# Patient Record
Sex: Female | Born: 1935 | Race: Black or African American | Hispanic: No | Marital: Married | Smoking: Former smoker
Health system: Southern US, Community
[De-identification: ages and names within clinical notes are randomized; demographics above are authoritative.]

## PROBLEM LIST (undated history)

## (undated) ENCOUNTER — Emergency Department (HOSPITAL_COMMUNITY): Payer: Medicare Other

## (undated) DIAGNOSIS — K219 Gastro-esophageal reflux disease without esophagitis: Secondary | ICD-10-CM

## (undated) DIAGNOSIS — C189 Malignant neoplasm of colon, unspecified: Secondary | ICD-10-CM

## (undated) DIAGNOSIS — G459 Transient cerebral ischemic attack, unspecified: Secondary | ICD-10-CM

## (undated) DIAGNOSIS — B029 Zoster without complications: Secondary | ICD-10-CM

## (undated) DIAGNOSIS — I2510 Atherosclerotic heart disease of native coronary artery without angina pectoris: Secondary | ICD-10-CM

## (undated) DIAGNOSIS — M549 Dorsalgia, unspecified: Secondary | ICD-10-CM

## (undated) DIAGNOSIS — N390 Urinary tract infection, site not specified: Secondary | ICD-10-CM

## (undated) DIAGNOSIS — H40009 Preglaucoma, unspecified, unspecified eye: Secondary | ICD-10-CM

## (undated) DIAGNOSIS — E785 Hyperlipidemia, unspecified: Secondary | ICD-10-CM

## (undated) DIAGNOSIS — I10 Essential (primary) hypertension: Secondary | ICD-10-CM

## (undated) HISTORY — DX: Hyperlipidemia, unspecified: E78.5

## (undated) HISTORY — DX: Malignant neoplasm of colon, unspecified: C18.9

## (undated) HISTORY — DX: Atherosclerotic heart disease of native coronary artery without angina pectoris: I25.10

## (undated) HISTORY — DX: Gastro-esophageal reflux disease without esophagitis: K21.9

## (undated) HISTORY — DX: Dorsalgia, unspecified: M54.9

## (undated) HISTORY — PX: ABDOMINAL HYSTERECTOMY: SHX81

## (undated) HISTORY — PX: BACK SURGERY: SHX140

## (undated) HISTORY — DX: Essential (primary) hypertension: I10

## (undated) HISTORY — DX: Zoster without complications: B02.9

## (undated) HISTORY — PX: OTHER SURGICAL HISTORY: SHX169

---

## 1998-05-13 ENCOUNTER — Other Ambulatory Visit: Admission: RE | Admit: 1998-05-13 | Discharge: 1998-05-13 | Payer: Self-pay | Admitting: Family Medicine

## 1999-04-08 ENCOUNTER — Inpatient Hospital Stay (HOSPITAL_COMMUNITY): Admission: AD | Admit: 1999-04-08 | Discharge: 1999-04-17 | Payer: Self-pay | Admitting: Family Medicine

## 1999-04-11 ENCOUNTER — Encounter: Payer: Self-pay | Admitting: Family Medicine

## 1999-04-28 ENCOUNTER — Encounter (HOSPITAL_BASED_OUTPATIENT_CLINIC_OR_DEPARTMENT_OTHER): Payer: Self-pay | Admitting: General Surgery

## 1999-04-28 ENCOUNTER — Ambulatory Visit (HOSPITAL_COMMUNITY): Admission: RE | Admit: 1999-04-28 | Discharge: 1999-04-28 | Payer: Self-pay | Admitting: General Surgery

## 1999-05-21 ENCOUNTER — Encounter (HOSPITAL_BASED_OUTPATIENT_CLINIC_OR_DEPARTMENT_OTHER): Payer: Self-pay | Admitting: General Surgery

## 1999-05-21 ENCOUNTER — Ambulatory Visit (HOSPITAL_BASED_OUTPATIENT_CLINIC_OR_DEPARTMENT_OTHER): Admission: RE | Admit: 1999-05-21 | Discharge: 1999-05-21 | Payer: Self-pay | Admitting: General Surgery

## 1999-06-22 ENCOUNTER — Encounter: Admission: RE | Admit: 1999-06-22 | Discharge: 1999-06-22 | Payer: Self-pay | Admitting: Oncology

## 1999-06-22 ENCOUNTER — Encounter: Payer: Self-pay | Admitting: Oncology

## 1999-12-24 ENCOUNTER — Ambulatory Visit (HOSPITAL_COMMUNITY): Admission: RE | Admit: 1999-12-24 | Discharge: 1999-12-24 | Payer: Self-pay | Admitting: Oncology

## 1999-12-24 ENCOUNTER — Encounter: Payer: Self-pay | Admitting: Oncology

## 2000-06-05 ENCOUNTER — Ambulatory Visit (HOSPITAL_COMMUNITY): Admission: RE | Admit: 2000-06-05 | Discharge: 2000-06-05 | Payer: Self-pay | Admitting: Gastroenterology

## 2000-11-16 ENCOUNTER — Encounter: Admission: RE | Admit: 2000-11-16 | Discharge: 2000-11-16 | Payer: Self-pay | Admitting: Oncology

## 2000-11-16 ENCOUNTER — Encounter: Payer: Self-pay | Admitting: Oncology

## 2003-05-07 ENCOUNTER — Encounter (INDEPENDENT_AMBULATORY_CARE_PROVIDER_SITE_OTHER): Payer: Self-pay | Admitting: *Deleted

## 2003-05-07 ENCOUNTER — Ambulatory Visit (HOSPITAL_COMMUNITY): Admission: RE | Admit: 2003-05-07 | Discharge: 2003-05-07 | Payer: Self-pay | Admitting: Gastroenterology

## 2004-10-06 ENCOUNTER — Observation Stay (HOSPITAL_COMMUNITY): Admission: EM | Admit: 2004-10-06 | Discharge: 2004-10-07 | Payer: Self-pay | Admitting: Emergency Medicine

## 2005-07-11 HISTORY — PX: CORONARY STENT PLACEMENT: SHX1402

## 2005-07-15 ENCOUNTER — Inpatient Hospital Stay (HOSPITAL_COMMUNITY): Admission: EM | Admit: 2005-07-15 | Discharge: 2005-07-17 | Payer: Self-pay | Admitting: Internal Medicine

## 2005-07-15 ENCOUNTER — Ambulatory Visit: Payer: Self-pay | Admitting: Cardiology

## 2005-08-05 ENCOUNTER — Ambulatory Visit: Payer: Self-pay | Admitting: Cardiology

## 2005-08-12 ENCOUNTER — Ambulatory Visit: Payer: Self-pay | Admitting: Cardiology

## 2005-10-20 ENCOUNTER — Ambulatory Visit: Payer: Self-pay | Admitting: Cardiology

## 2005-10-27 ENCOUNTER — Ambulatory Visit: Payer: Self-pay | Admitting: Cardiology

## 2006-02-01 ENCOUNTER — Ambulatory Visit: Payer: Self-pay | Admitting: Cardiology

## 2006-02-08 ENCOUNTER — Ambulatory Visit: Payer: Self-pay

## 2006-07-13 ENCOUNTER — Ambulatory Visit: Payer: Self-pay | Admitting: Cardiology

## 2006-07-17 ENCOUNTER — Ambulatory Visit: Payer: Self-pay | Admitting: Cardiology

## 2006-07-17 ENCOUNTER — Encounter: Payer: Self-pay | Admitting: Cardiology

## 2006-09-11 ENCOUNTER — Ambulatory Visit: Payer: Self-pay | Admitting: Cardiology

## 2006-09-11 LAB — CONVERTED CEMR LAB
Albumin: 4 g/dL (ref 3.5–5.2)
Cholesterol: 159 mg/dL (ref 0–200)
HDL: 54.2 mg/dL (ref >39.0)
Total Bilirubin: 0.5 mg/dL (ref 0.3–1.2)
Total Protein: 7.6 g/dL (ref 6.0–8.3)
VLDL: 28 mg/dL (ref 0–40)

## 2007-01-16 ENCOUNTER — Ambulatory Visit: Payer: Self-pay | Admitting: Cardiology

## 2007-01-22 ENCOUNTER — Ambulatory Visit: Payer: Self-pay

## 2007-02-06 ENCOUNTER — Ambulatory Visit: Payer: Self-pay | Admitting: Cardiology

## 2007-02-12 ENCOUNTER — Ambulatory Visit: Payer: Self-pay | Admitting: Cardiology

## 2007-02-12 LAB — CONVERTED CEMR LAB
CO2: 30 meq/L (ref 19–32)
Calcium: 8.9 mg/dL (ref 8.4–10.5)
Chloride: 108 meq/L (ref 96–112)
Creatinine, Ser: 0.7 mg/dL (ref 0.4–1.2)
GFR calc Af Amer: 106 mL/min
Potassium: 3.9 meq/L (ref 3.5–5.1)

## 2007-02-17 ENCOUNTER — Emergency Department (HOSPITAL_COMMUNITY): Admission: EM | Admit: 2007-02-17 | Discharge: 2007-02-17 | Payer: Self-pay | Admitting: Emergency Medicine

## 2007-02-19 ENCOUNTER — Emergency Department (HOSPITAL_COMMUNITY): Admission: EM | Admit: 2007-02-19 | Discharge: 2007-02-19 | Payer: Self-pay | Admitting: Emergency Medicine

## 2007-04-17 ENCOUNTER — Ambulatory Visit: Payer: Self-pay | Admitting: Vascular Surgery

## 2007-04-17 ENCOUNTER — Emergency Department (HOSPITAL_COMMUNITY): Admission: EM | Admit: 2007-04-17 | Discharge: 2007-04-17 | Payer: Self-pay | Admitting: Emergency Medicine

## 2007-04-17 ENCOUNTER — Encounter (INDEPENDENT_AMBULATORY_CARE_PROVIDER_SITE_OTHER): Payer: Self-pay | Admitting: Emergency Medicine

## 2007-04-20 ENCOUNTER — Emergency Department (HOSPITAL_COMMUNITY): Admission: EM | Admit: 2007-04-20 | Discharge: 2007-04-20 | Payer: Self-pay | Admitting: Family Medicine

## 2007-07-30 ENCOUNTER — Ambulatory Visit: Payer: Self-pay | Admitting: Cardiology

## 2007-07-30 LAB — CONVERTED CEMR LAB
Cholesterol: 233 mg/dL
Direct LDL: 143.7 mg/dL
HDL: 52.9 mg/dL
Total CHOL/HDL Ratio: 4.4
Triglycerides: 138 mg/dL
VLDL: 28 mg/dL

## 2007-08-09 ENCOUNTER — Emergency Department (HOSPITAL_COMMUNITY): Admission: EM | Admit: 2007-08-09 | Discharge: 2007-08-09 | Payer: Self-pay | Admitting: Family Medicine

## 2007-10-18 ENCOUNTER — Ambulatory Visit: Payer: Self-pay | Admitting: Family Medicine

## 2007-10-18 ENCOUNTER — Encounter: Payer: Self-pay | Admitting: Family Medicine

## 2007-10-18 DIAGNOSIS — E785 Hyperlipidemia, unspecified: Secondary | ICD-10-CM

## 2007-10-18 DIAGNOSIS — C189 Malignant neoplasm of colon, unspecified: Secondary | ICD-10-CM | POA: Insufficient documentation

## 2007-10-18 DIAGNOSIS — I10 Essential (primary) hypertension: Secondary | ICD-10-CM | POA: Insufficient documentation

## 2008-02-05 ENCOUNTER — Ambulatory Visit: Payer: Self-pay | Admitting: Family Medicine

## 2008-02-05 ENCOUNTER — Encounter: Admission: RE | Admit: 2008-02-05 | Discharge: 2008-02-05 | Payer: Self-pay | Admitting: Family Medicine

## 2008-02-06 ENCOUNTER — Telehealth: Payer: Self-pay | Admitting: *Deleted

## 2008-02-06 ENCOUNTER — Encounter: Admission: RE | Admit: 2008-02-06 | Discharge: 2008-02-06 | Payer: Self-pay | Admitting: Family Medicine

## 2008-02-12 ENCOUNTER — Ambulatory Visit: Payer: Self-pay | Admitting: Family Medicine

## 2008-05-30 ENCOUNTER — Ambulatory Visit: Payer: Self-pay | Admitting: Cardiology

## 2008-05-30 ENCOUNTER — Ambulatory Visit: Payer: Self-pay | Admitting: Family Medicine

## 2008-05-30 ENCOUNTER — Ambulatory Visit (HOSPITAL_COMMUNITY): Admission: RE | Admit: 2008-05-30 | Discharge: 2008-05-30 | Payer: Self-pay | Admitting: Family Medicine

## 2008-05-30 ENCOUNTER — Telehealth: Payer: Self-pay | Admitting: *Deleted

## 2008-05-30 ENCOUNTER — Observation Stay (HOSPITAL_COMMUNITY): Admission: EM | Admit: 2008-05-30 | Discharge: 2008-05-31 | Payer: Self-pay | Admitting: Emergency Medicine

## 2008-06-02 ENCOUNTER — Encounter: Payer: Self-pay | Admitting: Family Medicine

## 2008-06-04 ENCOUNTER — Encounter (INDEPENDENT_AMBULATORY_CARE_PROVIDER_SITE_OTHER): Payer: Self-pay | Admitting: *Deleted

## 2008-06-06 ENCOUNTER — Ambulatory Visit: Payer: Self-pay

## 2008-06-09 ENCOUNTER — Encounter: Payer: Self-pay | Admitting: Family Medicine

## 2008-06-09 ENCOUNTER — Ambulatory Visit: Payer: Self-pay | Admitting: Family Medicine

## 2008-06-09 LAB — CONVERTED CEMR LAB
Glucose, Urine, Semiquant: NEGATIVE
Protein, U semiquant: >=300
Specific Gravity, Urine: >=1.03
pH: 5.5

## 2008-06-18 ENCOUNTER — Ambulatory Visit: Payer: Self-pay | Admitting: Cardiology

## 2008-09-22 ENCOUNTER — Ambulatory Visit: Payer: Self-pay | Admitting: Cardiology

## 2008-09-22 ENCOUNTER — Encounter: Payer: Self-pay | Admitting: Cardiology

## 2008-09-22 DIAGNOSIS — I2510 Atherosclerotic heart disease of native coronary artery without angina pectoris: Secondary | ICD-10-CM | POA: Insufficient documentation

## 2008-09-22 LAB — CONVERTED CEMR LAB
AST: 21 units/L (ref 0–37)
Albumin: 3.9 g/dL (ref 3.5–5.2)
Alkaline Phosphatase: 45 units/L (ref 39–117)
BUN: 15 mg/dL (ref 6–23)
Calcium: 9.1 mg/dL (ref 8.4–10.5)
GFR calc non Af Amer: 87 mL/min
LDL Cholesterol: 106 mg/dL — ABNORMAL HIGH (ref 0–99)
Potassium: 3.3 meq/L — ABNORMAL LOW (ref 3.5–5.1)
Sodium: 142 meq/L (ref 135–145)
Total Bilirubin: 0.7 mg/dL (ref 0.3–1.2)
Total Protein: 7.3 g/dL (ref 6.0–8.3)
Triglycerides: 141 mg/dL (ref 0–149)

## 2008-10-20 ENCOUNTER — Telehealth (INDEPENDENT_AMBULATORY_CARE_PROVIDER_SITE_OTHER): Payer: Self-pay | Admitting: *Deleted

## 2008-10-24 ENCOUNTER — Ambulatory Visit: Payer: Self-pay | Admitting: Cardiology

## 2008-10-24 LAB — CONVERTED CEMR LAB
BUN: 12 mg/dL (ref 6–23)
Calcium: 9.2 mg/dL (ref 8.4–10.5)
Creatinine, Ser: 0.7 mg/dL (ref 0.4–1.2)
GFR calc non Af Amer: 105.44 mL/min (ref >60)
Glucose, Bld: 146 mg/dL — ABNORMAL HIGH (ref 70–99)
Sodium: 141 meq/L (ref 135–145)

## 2009-02-06 ENCOUNTER — Encounter (INDEPENDENT_AMBULATORY_CARE_PROVIDER_SITE_OTHER): Payer: Self-pay | Admitting: *Deleted

## 2009-02-24 ENCOUNTER — Ambulatory Visit: Payer: Self-pay | Admitting: Family Medicine

## 2009-02-27 ENCOUNTER — Encounter: Payer: Self-pay | Admitting: Family Medicine

## 2009-03-17 ENCOUNTER — Ambulatory Visit: Payer: Self-pay | Admitting: Cardiology

## 2009-05-10 ENCOUNTER — Emergency Department (HOSPITAL_COMMUNITY): Admission: EM | Admit: 2009-05-10 | Discharge: 2009-05-10 | Payer: Self-pay | Admitting: Emergency Medicine

## 2009-05-10 ENCOUNTER — Telehealth: Payer: Self-pay | Admitting: Family Medicine

## 2009-05-12 ENCOUNTER — Ambulatory Visit: Payer: Self-pay | Admitting: Family Medicine

## 2009-05-13 ENCOUNTER — Telehealth: Payer: Self-pay | Admitting: Family Medicine

## 2009-05-14 ENCOUNTER — Ambulatory Visit: Payer: Self-pay | Admitting: Family Medicine

## 2009-05-14 DIAGNOSIS — IMO0002 Reserved for concepts with insufficient information to code with codable children: Secondary | ICD-10-CM

## 2009-05-15 ENCOUNTER — Encounter: Payer: Self-pay | Admitting: *Deleted

## 2009-05-16 ENCOUNTER — Emergency Department (HOSPITAL_COMMUNITY): Admission: EM | Admit: 2009-05-16 | Discharge: 2009-05-16 | Payer: Self-pay | Admitting: Emergency Medicine

## 2009-05-16 ENCOUNTER — Telehealth: Payer: Self-pay | Admitting: Family Medicine

## 2009-05-18 ENCOUNTER — Encounter: Payer: Self-pay | Admitting: *Deleted

## 2009-05-18 ENCOUNTER — Telehealth: Payer: Self-pay | Admitting: Family Medicine

## 2009-05-18 ENCOUNTER — Ambulatory Visit: Payer: Self-pay | Admitting: Internal Medicine

## 2009-05-18 ENCOUNTER — Ambulatory Visit: Payer: Self-pay | Admitting: Family Medicine

## 2009-05-18 ENCOUNTER — Encounter: Payer: Self-pay | Admitting: Family Medicine

## 2009-05-18 ENCOUNTER — Inpatient Hospital Stay (HOSPITAL_COMMUNITY): Admission: EM | Admit: 2009-05-18 | Discharge: 2009-05-20 | Payer: Self-pay | Admitting: Emergency Medicine

## 2009-05-18 DIAGNOSIS — M5126 Other intervertebral disc displacement, lumbar region: Secondary | ICD-10-CM

## 2009-05-21 ENCOUNTER — Encounter: Payer: Self-pay | Admitting: Family Medicine

## 2009-05-21 ENCOUNTER — Telehealth: Payer: Self-pay | Admitting: Family Medicine

## 2009-05-26 ENCOUNTER — Inpatient Hospital Stay (HOSPITAL_COMMUNITY): Admission: RE | Admit: 2009-05-26 | Discharge: 2009-05-27 | Payer: Self-pay | Admitting: Neurosurgery

## 2009-06-01 ENCOUNTER — Telehealth: Payer: Self-pay | Admitting: Family Medicine

## 2009-06-24 ENCOUNTER — Encounter: Payer: Self-pay | Admitting: Family Medicine

## 2009-07-20 ENCOUNTER — Ambulatory Visit: Payer: Self-pay | Admitting: Family Medicine

## 2009-07-20 ENCOUNTER — Telehealth: Payer: Self-pay | Admitting: Family Medicine

## 2009-07-20 DIAGNOSIS — K219 Gastro-esophageal reflux disease without esophagitis: Secondary | ICD-10-CM | POA: Insufficient documentation

## 2009-07-24 ENCOUNTER — Telehealth: Payer: Self-pay | Admitting: *Deleted

## 2009-07-28 ENCOUNTER — Telehealth: Payer: Self-pay | Admitting: Family Medicine

## 2009-07-29 ENCOUNTER — Ambulatory Visit: Payer: Self-pay | Admitting: Family Medicine

## 2009-07-29 ENCOUNTER — Encounter: Payer: Self-pay | Admitting: Family Medicine

## 2009-07-29 DIAGNOSIS — N390 Urinary tract infection, site not specified: Secondary | ICD-10-CM

## 2009-07-29 LAB — CONVERTED CEMR LAB
Bilirubin Urine: NEGATIVE
Glucose, Urine, Semiquant: NEGATIVE
Nitrite: NEGATIVE
Protein, U semiquant: 30
Specific Gravity, Urine: 1.015
pH: 5.5

## 2009-08-05 ENCOUNTER — Encounter: Payer: Self-pay | Admitting: Family Medicine

## 2009-08-18 ENCOUNTER — Encounter: Payer: Self-pay | Admitting: *Deleted

## 2009-10-05 ENCOUNTER — Encounter: Payer: Self-pay | Admitting: Family Medicine

## 2009-11-25 ENCOUNTER — Ambulatory Visit: Payer: Self-pay | Admitting: Cardiology

## 2010-01-22 ENCOUNTER — Telehealth: Payer: Self-pay | Admitting: *Deleted

## 2010-05-17 ENCOUNTER — Ambulatory Visit: Payer: Self-pay | Admitting: Family Medicine

## 2010-08-01 ENCOUNTER — Encounter: Payer: Self-pay | Admitting: Family Medicine

## 2010-08-03 ENCOUNTER — Ambulatory Visit
Admission: RE | Admit: 2010-08-03 | Discharge: 2010-08-03 | Payer: Self-pay | Source: Home / Self Care | Attending: Family Medicine | Admitting: Family Medicine

## 2010-08-04 ENCOUNTER — Telehealth: Payer: Self-pay | Admitting: Cardiology

## 2010-08-08 LAB — CONVERTED CEMR LAB
CO2: 25 meq/L (ref 19–32)
Calcium: 9.1 mg/dL (ref 8.4–10.5)
Glucose, Bld: 76 mg/dL (ref 70–99)
Potassium: 4.2 meq/L (ref 3.5–5.3)
Sodium: 143 meq/L (ref 135–145)

## 2010-08-12 NOTE — Assessment & Plan Note (Signed)
Summary: FLU SHOT/KH  Nurse Visit   Vital Signs:  Patient profile:   77 year old female Temp:     97.7 degrees F oral  Vitals Entered By: Loralee Pacas CMA (May 17, 2010 11:30 AM) CC: flu shot   Allergies: 1)  Penicillin V Potassium (Penicillin V Potassium)  Immunizations Administered:  Influenza Vaccine # 1:    Vaccine Type: Fluvax MCR    Site: left deltoid    Mfr: GlaxoSmithKline    Dose: 0.5 ml    Route: IM    Given by: Loralee Pacas CMA    Exp. Date: 01/08/2011    Lot #: [id number]    VIS given: 02/02/10 version given May 17, 2010.  Flu Vaccine Consent Questions:    Do you have a history of severe allergic reactions to this vaccine? no    Any prior history of allergic reactions to egg and/or gelatin? no    Do you have a sensitivity to the preservative Thimersol? no    Do you have a past history of Guillan-Barre Syndrome? no    Do you currently have an acute febrile illness? no    Have you ever had a severe reaction to latex? no    Vaccine information given and explained to patient? yes    Are you currently pregnant? no  Orders Added: 1)  Influenza Vaccine MCR [00025] 2)  Admin 1st Vaccine [09811]

## 2010-08-12 NOTE — Assessment & Plan Note (Signed)
Summary: rov   Visit Type:  rov Primary Provider:  Marisue Ivan, MD  CC:  sob w/stairs but says this is better than before...denies any cp or edema.  History of Present Illness: Natalie [name] returns today for evaluation and management her coronary artery disease.  She's having no angina or ischemic symptoms. She states that he comply with the medications that she was unable to maneuver [facility]. Please see my note. At that time, we were electronic prescribing so I think this is doubtful.  She has orthopnea, PND or significant edema. She denies palpitations or syncope. Her blood pressure is under good control. Her labs being followed by primary care.  Current Medications (verified): 1)  Aspirin Ec 325 Mg Tbec (Aspirin) .... Take One Tablet By Mouth Daily 2)  Plavix 75 Mg  Tabs (Clopidogrel Bisulfate) .Marland Kitchen.. 1 Tab Once Daily 3)  Metoprolol Tartrate 50 Mg  Tabs (Metoprolol Tartrate) .... Take One Tablet By Mouth Two Times A Day 4)  Nitrostat 0.4 Mg Subl (Nitroglycerin) .Marland Kitchen.. 1 Tablet Under Tongue At Onset of Chest Pain; You May Repeat Every 5 Minutes For Up To 3 Doses. 5)  Hydrochlorothiazide 12.5 Mg  Tabs (Hydrochlorothiazide) .... Take 1 Tab  By Mouth Every Morning 6)  Pravastatin Sodium 40 Mg  Tabs (Pravastatin Sodium) .... One Table By Mouth Once Daily  Allergies: 1)  Penicillin V Potassium (Penicillin V Potassium)  Past History:  Past Medical History: Last updated: 07/29/2009 Hypertension Hyperlipidemia History of adenocarcinoma of cecum 2000 s/p  chemo and R colon resection CAD - Drug eluting stent 1/07, Low Risk myoview 7/08, Followed by [facility] Back pain with radiculopathy - s/p L4-L5 lateral microdiscectomy GERD  Past Surgical History: Last updated: 07/20/2009 Hysterectomy 1967 Partial Colectomy- (R colon) 2000 Drug eluting stent placement- 07/2005  L4-L5 lateral microdiscectomy  Family History: Last updated:  09/19/2008 1. Father- stroke in late 6s No early CAD  Social History: Last updated: 05/18/2009 Married and lives with husband in [place].  Retired Runner, broadcasting/film/video.  Hx of tobacco abuse- 20pk yr hx, quit 20 years ago.  No EtOH.  No drug abuse.  Walks daily.  Christian.  Risk Factors: Smoking Status: never (07/20/2009)  Review of Systems       negative other than history of present illness  Vital Signs:  Patient profile:   77 year old female Height:      67 inches Weight:      144 pounds BMI:     22.64 Pulse rate:   74 / minute Pulse rhythm:   regular BP sitting:   124 / 60  (left arm) Cuff size:   large  Vitals Entered By: Danielle Rankin, CMA (Nov 25, 2009 10:30 AM)  Physical Exam  General:  Well developed, well nourished, in no acute distress. Head:  normocephalic and atraumatic Eyes:  PERRLA/EOM intact; conjunctiva and lids normal. Neck:  Neck supple, no JVD. No masses, thyromegaly or abnormal cervical nodes. Chest Krystopher Kuenzel:  no deformities or breast masses noted Lungs:  Clear bilaterally to auscultation and percussion. Heart:  Non-displaced PMI, chest non-tender; regular rate and rhythm, S1, S2 without murmurs, rubs or gallops. Carotid upstroke normal, no bruit. Normal abdominal aortic size, no bruits. Femorals normal pulses, no bruits. Pedals normal pulses. No edema, no varicosities. Msk:  Back normal, normal gait. Muscle strength and tone normal. Pulses:  pulses normal in all 4 extremities Extremities:  No clubbing or cyanosis. Neurologic:  Alert and oriented x 3. Skin:  Intact without lesions or rashes. Psych:  Normal affect.   Impression & Recommendations:  Problem # 1:  CAD, NATIVE VESSEL (ICD-414.01) She is currently stable. She'll be due objective assessment of her coronary disease in November. We'll arrange for a stress Myoview at that time with follow with me. We have electronically prescribed her nitroglycerin with instructions on how to use it. Her updated medication list  for this problem includes:    Aspirin Ec 325 Mg Tbec (Aspirin) .Marland Kitchen... Take one tablet by mouth daily    Plavix 75 Mg Tabs (Clopidogrel bisulfate) .Marland Kitchen... 1 tab once daily    Metoprolol Tartrate 50 Mg Tabs (Metoprolol tartrate) .Marland Kitchen... Take one tablet by mouth two times a day    Nitrostat 0.4 Mg Subl (Nitroglycerin) .Marland Kitchen... 1 tablet under tongue at onset of chest pain; you may repeat every 5 minutes for up to 3 doses.  Orders: Nuclear Stress Test (Nuc Stress Test)  Her updated medication list for this problem includes:    Aspirin Ec 325 Mg Tbec (Aspirin) .Marland Kitchen... Take one tablet by mouth daily    Plavix 75 Mg Tabs (Clopidogrel bisulfate) .Marland Kitchen... 1 tab once daily    Metoprolol Tartrate 50 Mg Tabs (Metoprolol tartrate) .Marland Kitchen... Take one tablet by mouth two times a day    Nitrostat 0.4 Mg Subl (Nitroglycerin) .Marland Kitchen... 1 tablet under tongue at onset of chest pain; you may repeat every 5 minutes for up to 3 doses.  Problem # 2:  ESSENTIAL HYPERTENSION, BENIGN (ICD-401.1) Assessment: Improved  Her updated medication list for this problem includes:    Aspirin Ec 325 Mg Tbec (Aspirin) .Marland Kitchen... Take one tablet by mouth daily    Metoprolol Tartrate 50 Mg Tabs (Metoprolol tartrate) .Marland Kitchen... Take one tablet by mouth two times a day    Hydrochlorothiazide 12.5 Mg Tabs (Hydrochlorothiazide) .Marland Kitchen... Take 1 tab  by mouth every morning  Her updated medication list for this problem includes:    Aspirin Ec 325 Mg Tbec (Aspirin) .Marland Kitchen... Take one tablet by mouth daily    Metoprolol Tartrate 50 Mg Tabs (Metoprolol tartrate) .Marland Kitchen... Take one tablet by mouth two times a day    Hydrochlorothiazide 12.5 Mg Tabs (Hydrochlorothiazide) .Marland Kitchen... Take 1 tab  by mouth every morning  Problem # 3:  HYPERLIPIDEMIA (ICD-272.4)  Her updated medication list for this problem includes:    Pravastatin Sodium 40 Mg Tabs (Pravastatin sodium) ..... One table by mouth once daily  Her updated medication list for this problem includes:    Pravastatin Sodium 40  Mg Tabs (Pravastatin sodium) ..... One table by mouth once daily  Patient Instructions: 1)  Your physician recommends that you schedule a follow-up appointment in: 6 MONTHS WITH DR Tulani Kidney 2)  Your physician recommends that you continue on your current medications as directed. Please refer to the Current Medication list given to you today. 3)  Your physician has requested that you have an adenosine myoview.  For further information please visit [email or web address]  Please follow instruction sheet, as given.SEE DR Heather Streeper SAME DAY Prescriptions: NITROSTAT 0.4 MG SUBL (NITROGLYCERIN) 1 tablet under tongue at onset of chest pain; you may repeat every 5 minutes for up to 3 doses.  #25 x 9   Entered by:   Danielle Rankin, CMA   Authorized by:   Gaylord Shih, MD, [facility]   Signed by:   Danielle Rankin, CMA on 11/25/2009   Method used:   [facility]  [address] [phone][facility] (retail[facility]  [address]       [place], [place]  [address]       Ph: [phone] or [phone]       Fax: [phone]   RxID:   [id number]

## 2010-08-12 NOTE — Consult Note (Signed)
Summary: [facility]   Imported By: De Nurse 08/14/2009 17:18:19  _____________________________________________________________________  External Attachment:    Type:   Image     Comment:   External Document  Appended Document: [facility] Reviewed.

## 2010-08-12 NOTE — Assessment & Plan Note (Signed)
Summary: sore throat,df   Vital Signs:  Patient profile:   77 year old female Height:      67 inches Weight:      143 pounds Temp:     97.6 degrees F oral Pulse rate:   81 / minute Pulse rhythm:   regular BP sitting:   115 / 74  (right arm) Cuff size:   regular  Vitals Entered By: Loralee Pacas CMA (August 03, 2010 11:14 AM) CC: sore throat x 1 day   Primary Care Provider:  Marisue Ivan, MD  CC:  sore throat x 1 day.  History of Present Illness: Sore throat and sinus problems for over a week and very concerned. Symptoms are worse at night.  Feels that she likely picked  up a virus.    Habits & Providers  Alcohol-Tobacco-Diet     Tobacco Status: never  Exercise-Depression-Behavior     Have you felt down or hopeless? no     Have you felt little pleasure in things? no     Depression Counseling: not indicated; screening negative for depression     Seat Belt Use: always  Current Medications (verified): 1)  Aspirin Ec 325 Mg Tbec (Aspirin) .... Take One Tablet By Mouth Daily 2)  Plavix 75 Mg  Tabs (Clopidogrel Bisulfate) .Marland Kitchen.. 1 Tab Once Daily 3)  Metoprolol Tartrate 50 Mg  Tabs (Metoprolol Tartrate) .... Take One Tablet By Mouth Two Times A Day 4)  Nitrostat 0.4 Mg Subl (Nitroglycerin) .Marland Kitchen.. 1 Tablet Under Tongue At Onset of Chest Pain; You May Repeat Every 5 Minutes For Up To 3 Doses. 5)  Hydrochlorothiazide 12.5 Mg  Tabs (Hydrochlorothiazide) .... Take 1 Tab  By Mouth Every Morning 6)  Zithromax Z-Pak 250 Mg Tabs (Azithromycin) .... As Directed 7)  Nasonex 50 Mcg/act Susp (Mometasone Furoate) .... 2 Squirts in Each Nostril Daily  Allergies: 1)  Penicillin V Potassium (Penicillin V Potassium)  Social History: Risk analyst Use:  always  Physical Exam  General:  Well-developed,well-nourished,in no acute distress; alert,appropriate and cooperative throughout examination Ears:  TM retracted Nose:  inflammed Mouth:  post nasal drainage Neck:  No deformities, masses,  or tenderness noted. Lungs:  normal respiratory effort and normal breath sounds.   Heart:  normal rate, regular rhythm, and no murmur.     Impression & Recommendations:  Problem # 1:  SINUSITIS, ACUTE (ICD-461.9)  Her updated medication list for this problem includes:    Zithromax Z-pak 250 Mg Tabs (Azithromycin) .Marland Kitchen... As directed    Nasonex 50 Mcg/act Susp (Mometasone furoate) .Marland Kitchen... 2 squirts in each nostril daily  Orders: [facility]- Est Level  2 (54098)  Complete Medication List: 1)  Aspirin Ec 325 Mg Tbec (Aspirin) .... Take one tablet by mouth daily 2)  Plavix 75 Mg Tabs (Clopidogrel bisulfate) .Marland Kitchen.. 1 tab once daily 3)  Metoprolol Tartrate 50 Mg Tabs (Metoprolol tartrate) .... Take one tablet by mouth two times a day 4)  Nitrostat 0.4 Mg Subl (Nitroglycerin) .Marland Kitchen.. 1 tablet under tongue at onset of chest pain; you may repeat every 5 minutes for up to 3 doses. 5)  Hydrochlorothiazide 12.5 Mg Tabs (Hydrochlorothiazide) .... Take 1 tab  by mouth every morning 6)  Zithromax Z-pak 250 Mg Tabs (Azithromycin) .... As directed 7)  Nasonex 50 Mcg/act Susp (Mometasone furoate) .... 2 squirts in each nostril daily  Patient Instructions: 1)  Take all of the antibiotic 2)  Use the nasal spray at least 2 weeks. 3)  Please schedule a follow-up  appointment as needed .  Prescriptions: NASONEX 50 MCG/ACT SUSP (MOMETASONE FUROATE) 2 squirts in each nostril daily  #1 x 1   Entered and Authorized by:   Luretha Murphy NP   Signed by:   Luretha Murphy NP on 08/03/2010   Method used:   [facility]  [address] [id number]       [address]       [place], [place]  [address]       Ph: [phone] or [phone]       Fax: [phone]   RxID:   [id number] ZITHROMAX Z-PAK 250 MG TABS (AZITHROMYCIN) as directed  #1 x 0   Entered and Authorized by:   Luretha Murphy NP   Signed by:   Luretha Murphy NP on 08/03/2010   Method used:   [facility]        [facility]   [address] [id number]       [address]       [place], [place]  [address]       Ph: [phone] or [phone]       Fax: [phone]   RxID:   [id number]    Orders Added: 1)  [facility]- Est Level  2 [37106]

## 2010-08-12 NOTE — Assessment & Plan Note (Signed)
Summary: "weak" & does not feel well/[place]   Vital Signs:  Patient profile:   77 year old female Height:      67 inches Weight:      144.3 pounds BMI:     22.68 Temp:     98.1 degrees F oral Pulse rate:   93 / minute BP sitting:   124 / 77  (left arm) Cuff size:   regular  Vitals Entered By: Gladstone Pih (July 20, 2009 4:35 PM) 3 CC: fatigue, GERD Is Patient Diabetic? No Pain Assessment Patient in pain? no        Primary Care Provider:  Marisue Ivan, MD  CC:  fatigue and GERD.  History of Present Illness: 77 year old AAF s/p L4-L5 lateral microdiscectomy x < 2 months ago presenting with c/o of fatigue/weakness.  1. Fatigue: Worse over the past week. feels like she is "dragging" and thinks that hse has a viral infection. She denies fever/chills, CP, SOB, cough, N/V/D/C, HA, dizziness, LE edema, abdominal pain, and depression. She endorses decreased appetite though she has been drinking juice and eating fruit. She endorses trouble sleeping at night 2/2 back and radicular pain. Her pain regimen includes Vicodin, which she takes daily. She was recently prescribed Lyrica but hasn't started it yet because she wants to know more about the medication first.  2. GERD: Hx GERD. wants a new Rx since it has been worse lately.  Habits & Providers  Alcohol-Tobacco-Diet     Tobacco Status: never  Current Medications (verified): 1)  Sg Enteric Coated Aspirin 325 Mg  Tbec (Aspirin) .... Hold Until Seen By Neurosurgery 2)  Plavix 75 Mg  Tabs (Clopidogrel Bisulfate) .... Hold Until Seen By Neurosurgery 3)  Metoprolol Tartrate 50 Mg  Tabs (Metoprolol Tartrate) .... Take One Tablet By Mouth Two Times A Day 4)  Nitroquick 0.4 Mg  Subl (Nitroglycerin) .... Apply Sublingual Every 5 Minutes X 3 Doses As Needed For Chest Pain 5)  Hydrochlorothiazide 12.5 Mg  Tabs (Hydrochlorothiazide) .... Take 1 Tab  By Mouth Every Morning 6)  Pravastatin Sodium 40 Mg  Tabs (Pravastatin Sodium)  .... One Table By Mouth Once Daily 7)  Ranitidine Hcl 150 Mg Tabs (Ranitidine Hcl) .... One By Mouth Two Times A Day  Allergies (verified): 1)  Penicillin V Potassium (Penicillin V Potassium)  Past History:  Past medical, surgical, family and social histories (including risk factors) reviewed, and no changes noted (except as noted below).  Past Medical History: Hypertension Hyperlipidemia History of adenocarcinoma of cecum 2000 s/p  chemo and R colon resection CAD - Drug eluting stent 1/07, Low Risk myoview 7/08, Followed by [facility] Back pain with radiculopathy - s/p L4-L5 lateral microdiscectomy  Past Surgical History: Hysterectomy 1967 Partial Colectomy- (R colon) 2000 Drug eluting stent placement- 07/2005  L4-L5 lateral microdiscectomy  Family History: Reviewed history from 09/19/2008 and no changes required. 1. Father- stroke in late 52s No early CAD  Social History: Reviewed history from 05/18/2009 and no changes required. Married and lives with husband in [place].  Retired Runner, broadcasting/film/video.  Hx of tobacco abuse- 20pk yr hx, quit 20 years ago.  No EtOH.  No drug abuse.  Walks daily.  Christian. Smoking Status:  never  Review of Systems       She denies fever/chills, CP, SOB, cough, N/V/D/C, HA, dizziness, LE edema, abdominal pain. She edorses decreased appetite though she has been drinking juice and eating fruit. She endorses trouble sleeping at night 2/2 back and radicular pain.  Physical Exam  General:  Non ill appearing, sitting on table, A&O x3. Vitals reviewed. Mouth:  Pharynx pink and moist.   Neck:  No deformities, masses, or tenderness noted. Lungs:  Normal respiratory effort, chest expands symmetrically. Lungs are clear to auscultation, no crackles or wheezes. Heart:  Normal rate and regular rhythm. S1 and S2 normal without gallop, murmur, click, rub or other extra sounds. Abdomen:  Bowel sounds positive,abdomen soft and non-tender without masses, organomegaly  or hernias noted. Pulses:  R posterior tibial normal, R dorsalis pedis normal, L posterior tibial normal, and L dorsalis pedis normal.   Extremities:  No edema. Neurologic:  No focal neurological deficits, strength normal in all extremities. Skin:  Intact without suspicious lesions or rashes. Psych:  Oriented X3, memory intact for recent and remote, normally interactive, and good eye contact.     Impression & Recommendations:  Problem # 1:  FATIGUE (ICD-780.79) Assessment New History and exam not consistent with viral illness. This is more likely deconditioning secondary to her recent surgery, complicated by sedating medications and insomnia 2/2 pain. Will check several labs to R/O other organic causes. Flu vaccine given today. Red Flags given. Follow up with PCP in 1-2 weeks for re-check. Consider PT. Orders: [facility]- Est  Level 4 (99214)Future Orders: Vit D, 25 [facility] ([id number]) ... 08/05/2010 TSH-[facility] [phone]) ... 07/29/2010 CBC-[facility] (29528) ... 07/17/2010 Comp Met-[facility] ([id number]) ... 07/30/2010  Problem # 2:  RADICULOPATHY (ICD-729.2) Assessment: Unchanged Discussed Lyrica and its indication for nerve pain. Advised the patient to try the medication, but to take it at night since it is sedating. This may also help her insomnia. Orders: [facility]- Est  Level 4 (72536)  Problem # 3:  DYSPEPSIA (ICD-536.8) Assessment: Unchanged Patient requested Aciphex. However, I Rx Ranitidine since she is taking Plavix.  Complete Medication List: 1)  Sg Enteric Coated Aspirin 325 Mg Tbec (Aspirin) .... Hold until seen by neurosurgery 2)  Plavix 75 Mg Tabs (Clopidogrel bisulfate) .... Hold until seen by neurosurgery 3)  Metoprolol Tartrate 50 Mg Tabs (Metoprolol tartrate) .... Take one tablet by mouth two times a day 4)  Nitroquick 0.4 Mg Subl (Nitroglycerin) .... Apply sublingual every 5 minutes x 3 doses as needed for chest pain 5)  Hydrochlorothiazide 12.5 Mg Tabs (Hydrochlorothiazide) .... Take  1 tab  by mouth every morning 6)  Pravastatin Sodium 40 Mg Tabs (Pravastatin sodium) .... One table by mouth once daily 7)  Ranitidine Hcl 150 Mg Tabs (Ranitidine hcl) .... One by mouth two times a day  Other Orders: Influenza Vaccine MCR (64403)  Patient Instructions: 1)  It was nice to meet you today! 2)  We are going to check some labs today and let you know the results as sson as we can. 3)  Follow up with your primary doctor in 1-2 weeks. Prescriptions: RANITIDINE HCL 150 MG TABS (RANITIDINE HCL) one by mouth two times a day  #60 x 3   Entered and Authorized by:   Helane Rima DO   Signed by:   Helane Rima DO on 07/20/2009   Method used:   [facility]  [address] [id number]       [address]       [place], [place]  [address]       Ph: [phone] or [phone]       Fax: [phone]   RxID:   [id number]    Immunizations  Administered:  Influenza Vaccine # 1:    Vaccine Type: Fluvax MCR    Site: right deltoid    Mfr: GlaxoSmithKline    Dose: 0.5 ml    Route: IM    Given by: Loralee Pacas CMA    Exp. Date: 01/07/2010    Lot #: [id number]    VIS given: [id number]  Flu Vaccine Consent Questions:    Do you have a history of severe allergic reactions to this vaccine? no    Any prior history of allergic reactions to egg and/or gelatin? no    Do you have a sensitivity to the preservative Thimersol? no    Do you have a past history of Guillan-Barre Syndrome? no    Do you currently have an acute febrile illness? no    Have you ever had a severe reaction to latex? no    Vaccine information given and explained to patient? yes    Are you currently pregnant? no   Prevention & Chronic Care Immunizations   Influenza vaccine: Fluvax MCR  (07/20/2009)   Influenza vaccine due: 06/09/2009    Tetanus booster: Not documented    Pneumococcal vaccine: Not documented    H. zoster vaccine: Not documented  Colorectal  Screening   Hemoccult: Not documented    Colonoscopy: Not documented  Other Screening   Pap smear: Not documented    Mammogram: Not documented    DXA bone density scan: Not documented   Smoking status: never  (07/20/2009)  Lipids   Total Cholesterol: 184  (09/22/2008)   LDL: 106  (09/22/2008)   LDL Direct: 143.7  (07/30/2007)   HDL: 49.4  (09/22/2008)   Triglycerides: 141  (09/22/2008)    SGOT (AST): 21  (09/22/2008)   SGPT (ALT): 17  (09/22/2008) CMP ordered    Alkaline phosphatase: 45  (09/22/2008)   Total bilirubin: 0.7  (09/22/2008)    Lipid flowsheet reviewed?: Yes   Progress toward LDL goal: Unchanged  Hypertension   Last Blood Pressure: 124 / 77  (07/20/2009)   Serum creatinine: 0.7  (10/24/2008)   Serum potassium 3.7  (10/24/2008) CMP ordered     Hypertension flowsheet reviewed?: Yes   Progress toward BP goal: At goal  Self-Management Support :   Personal Goals (by the next clinic visit) :      Personal blood pressure goal: 130/80  (07/20/2009)     Personal LDL goal: 70  (07/20/2009)    Patient will work on the following items until the next clinic visit to reach self-care goals:     Medications and monitoring: take my medicines every day  (07/20/2009)     Eating: drink diet soda or water instead of juice or soda, eat more vegetables, use fresh or frozen vegetables, eat foods that are low in salt, eat baked foods instead of fried foods, eat fruit for snacks and desserts  (07/20/2009)    Hypertension self-management support: Written self-care plan  (07/20/2009)   Hypertension self-care plan printed.    Lipid self-management support: Written self-care plan  (07/20/2009)   Lipid self-care plan printed.

## 2010-08-12 NOTE — Miscellaneous (Signed)
Summary: prior auth  Form completed........Marland KitchenMarisue Ivan, MD. 07/30/2009  Clinical Lists Changes prior auth for acifex to pcp.Golden Circle RN  July 29, 2009 2:37 PM

## 2010-08-12 NOTE — Progress Notes (Signed)
Summary: triage  Phone Note Call from Patient Call back at Home Phone [phone]   Caller: Patient Summary of Call: Pt thinks she has a bladder infection. Initial call taken by: Clydell Hakim,  July 28, 2009 10:43 AM  Follow-up for Phone Call        has had this before. started sunday. frequent urination with small amounts. burning with voids. she is drinking a lot of water & eating cranberries. offered work in with wait today. she declined. will see pcp tomorrow at 11. told her to keep drinking plenty of water. she agreed with plan Follow-up by: Golden Circle RN,  July 28, 2009 11:11 AM  Additional Follow-up for Phone Call Additional follow up Details #1::        will f/u accordingly Additional Follow-up by: Marisue Ivan  MD,  July 28, 2009 3:17 PM

## 2010-08-12 NOTE — Miscellaneous (Signed)
Summary: acifex approved  Clinical Lists Changes acifex approved x 1 yr by [facility] RN  August 18, 2009 4:48 PM

## 2010-08-12 NOTE — Progress Notes (Addendum)
  Phone Note Outgoing Call   Call placed by: Lisabeth Devoid RN,  August 04, 2010 3:45 PM Call placed to: Patient Summary of Call: schedule stress test  Follow-up for Phone Call        I spoke with pt and she is recovering from a "cold" and would like [place] to call her back in February to reschedule test.  Follow-up by: Lisabeth Devoid RN,  August 04, 2010 3:47 PM

## 2010-08-12 NOTE — Progress Notes (Signed)
Summary: meds prob  Phone Note Call from Patient Call back at Home Phone [phone]   Caller: Patient Summary of Call: pt called and said she has been out of her HCTZ for a week, needs today Initial call taken by: De Nurse,  January 22, 2010 9:25 AM  Follow-up for Phone Call       Follow-up by: Golden Circle RN,  January 22, 2010 9:29 AM    Prescriptions: HYDROCHLOROTHIAZIDE 12.5 MG  TABS (HYDROCHLOROTHIAZIDE) Take 1 tab  by mouth every morning  #90 Capsule x 0   Entered by:   Golden Circle RN   Authorized by:   Majel Homer MD   Signed by:   Golden Circle RN on 01/22/2010   Method used:   [facility]  [id number]       [address]       [place], [place]  [address]       Ph: [phone] or [phone]       Fax: [phone]   RxID:   [id number]

## 2010-08-12 NOTE — Progress Notes (Signed)
Summary: triage  Phone Note Call from Patient Call back at Home Phone [phone]   Caller: Patient Summary of Call: thinks she has the flu and is not sure if she should come in Initial call taken by: De Nurse,  July 20, 2009 10:18 AM  Follow-up for Phone Call        sick since friday. c/o weakness & poor appetite. denies fever. denies n&V, diarrhea.  did not get a flu shot.  wants to be checked & get a flu shot. appt at 4:15 with Dr. Earlene Plater Follow-up by: Golden Circle RN,  July 20, 2009 11:36 AM

## 2010-08-12 NOTE — Progress Notes (Signed)
----   Converted from flag ---- ---- 07/24/2009 2:07 PM, Helane Rima DO wrote: please call this patient to see how she is feeling (any better or worse than during our visit) and to remind her to come in for labs. thanks. ------------------------------  called patient, says she is feeling better. reminded pt to schedule appt for labs, says she will call monday. also pt is requesting aciphex, says it works much better for her.  Appended Document:  sent Rx in for ranitidine instead of aciphex as patient is on plavix. will defer decision to PCP.

## 2010-08-12 NOTE — Consult Note (Signed)
Summary: [facility]  [facility]   Imported By: Clydell Hakim 10/29/2009 12:08:29  _____________________________________________________________________  External Attachment:    Type:   Image     Comment:   External Document  Appended Document: [facility] Reviewed.

## 2010-08-12 NOTE — Consult Note (Signed)
Summary: [facility]   Imported By: De Nurse 07/21/2009 14:46:12  _____________________________________________________________________  External Attachment:    Type:   Image     Comment:   External Document  Appended Document: [facility] Reviewed.  Good progress s/p surgery.

## 2010-08-12 NOTE — Assessment & Plan Note (Signed)
Summary: UTI, GERD, and HTN   Vital Signs:  Patient profile:   77 year old female Weight:      141.4 pounds BMI:     22.23 Temp:     97.7 degrees F oral Pulse rate:   76 / minute Pulse rhythm:   regular BP sitting:   136 / 88  (left arm) Cuff size:   regular  Vitals Entered By: Loralee Pacas CMA (July 29, 2009 11:44 AM) CC: "feeling bad" Comments thinks that she may have a kidney or bladder infection, she's been really tired wondering if she has a virus. stated that they were supposed to do labs last visit but did not. pt says she doesn't have an appetite   Primary Care Provider:  Marisue Ivan, MD  CC:  "feeling bad".  History of Present Illness: 77yo F c/o "feeling bad" with urinary symptoms  Urinary symptoms: x 5 days.  Inc urinary frequency with mild dysuria.  No hematuria.  No fevers, flank pain, N/V.  She was seen last week by Dr. Earlene Plater and was thought to be deconditioned b/c of her recent surgery.    GERD: Symptoms not adequately controlled with Zantac.  States that Aciphex is the only medication that has worked for her in the past.  HTN: No adverse effects from medication.  Not checking it regularly.  Was well controlled at last visit.  No dizziness, HA, CP, palpitations, or swelling.   Current Medications (verified): 1)  Sg Enteric Coated Aspirin 325 Mg  Tbec (Aspirin) .... Hold Until Seen By Neurosurgery 2)  Plavix 75 Mg  Tabs (Clopidogrel Bisulfate) .... Hold Until Seen By Neurosurgery 3)  Metoprolol Tartrate 50 Mg  Tabs (Metoprolol Tartrate) .... Take One Tablet By Mouth Two Times A Day 4)  Nitroquick 0.4 Mg  Subl (Nitroglycerin) .... Apply Sublingual Every 5 Minutes X 3 Doses As Needed For Chest Pain 5)  Hydrochlorothiazide 12.5 Mg  Tabs (Hydrochlorothiazide) .... Take 1 Tab  By Mouth Every Morning 6)  Pravastatin Sodium 40 Mg  Tabs (Pravastatin Sodium) .... One Table By Mouth Once Daily 7)  Aciphex 20 Mg Tbec (Rabeprazole Sodium) .Marland Kitchen.. 1 Tab By Mouth 30  Minutes Before Your First Meal of The Day 8)  Cipro 500 Mg Tabs (Ciprofloxacin Hcl) .Marland Kitchen.. 1 Tab By Mouth Two Times A Day X 7 Days  Allergies (verified): 1)  Penicillin V Potassium (Penicillin V Potassium)  Past History:  Past Medical History: Hypertension Hyperlipidemia History of adenocarcinoma of cecum 2000 s/p  chemo and R colon resection CAD - Drug eluting stent 1/07, Low Risk myoview 7/08, Followed by [facility] Back pain with radiculopathy - s/p L4-L5 lateral microdiscectomy GERD  Review of Systems       Inc urinary frequency with mild dysuria.  No hematuria.  No fevers, flank pain, N/V.   Physical Exam  General:  VS Reviewed. Well appearing, NAD.  Lungs:  Normal respiratory effort, chest expands symmetrically. Lungs are clear to auscultation, no crackles or wheezes. Heart:  Normal rate and regular rhythm. S1 and S2 normal without gallop, murmur, click, rub or other extra sounds. Abdomen:  Soft, NT, ND, no HSM, active BS  Msk:  no CVA tenderness   Impression & Recommendations:  Problem # 1:  UTI (ICD-599.0) Assessment New UA and micro confirms acute UTI.  Likely to explain why she is feeling so bad.  Aferile and seems hypodynamically stable.  Will treat with 7 day course of cipro.  Check a urine cx.  She is to call me in 1 week to give me the update on how she is doing.  If she is still feeling bad, plan for her to return to obtain CBC, TSH, and CMET.  Wi   Her updated medication list for this problem includes:    Cipro 500 Mg Tabs (Ciprofloxacin hcl) .Marland Kitchen... 1 tab by mouth two times a day x 7 days  Orders: Urinalysis-[facility] (00000) Urine Culture-[facility] ([address]) [facility]- Est  Level 4 ([address])  Problem # 2:  GERD (ICD-530.81) Assessment: Deteriorated  Symptoms not adequately controlled with Zantac.  States that only Aciphex has controlled her symptoms in the past.  Plan to switch her to Aciphex especially b/c of the acute anorexia she is experiencing.   Her  updated medication list for this problem includes:    Aciphex 20 Mg Tbec (Rabeprazole sodium) .Marland Kitchen... 1 tab by mouth 30 minutes before your first meal of the day  Orders: [facility]- Est  Level 4 ([address])  Problem # 3:  ESSENTIAL HYPERTENSION, BENIGN (ICD-401.1) Assessment: Unchanged  At goal (<140/90).  Tolerating meds.  No changes to regimen.     Her updated medication list for this problem includes:    Metoprolol Tartrate 50 Mg Tabs (Metoprolol tartrate) .Marland Kitchen... Take one tablet by mouth two times a day    Hydrochlorothiazide 12.5 Mg Tabs (Hydrochlorothiazide) .Marland Kitchen... Take 1 tab  by mouth every morning  Orders: [facility]- Est  Level 4 ([address])  Complete Medication List: 1)  Sg Enteric Coated Aspirin 325 Mg Tbec (Aspirin) .... Hold until seen by neurosurgery 2)  Plavix 75 Mg Tabs (Clopidogrel bisulfate) .... Hold until seen by neurosurgery 3)  Metoprolol Tartrate 50 Mg Tabs (Metoprolol tartrate) .... Take one tablet by mouth two times a day 4)  Nitroquick 0.4 Mg Subl (Nitroglycerin) .... Apply sublingual every 5 minutes x 3 doses as needed for chest pain 5)  Hydrochlorothiazide 12.5 Mg Tabs (Hydrochlorothiazide) .... Take 1 tab  by mouth every morning 6)  Pravastatin Sodium 40 Mg Tabs (Pravastatin sodium) .... One table by mouth once daily 7)  Aciphex 20 Mg Tbec (Rabeprazole sodium) .Marland Kitchen.. 1 tab by mouth 30 minutes before your first meal of the day 8)  Cipro 500 Mg Tabs (Ciprofloxacin hcl) .Marland Kitchen.. 1 tab by mouth two times a day x 7 days  Patient Instructions: 1)  Call me next week to let me know if you are feeling better.  If not, we will obtain lab work. 2)  I'm starting you on Cipro for your urine tract infection.  Take it for 7 days. Prescriptions: ACIPHEX 20 MG TBEC (RABEPRAZOLE SODIUM) 1 tab by mouth 30 minutes before your first meal of the day  #30 x 2   Entered and Authorized by:   Marisue Ivan  MD   Signed by:   Marisue Ivan  MD on 07/29/2009   Method used:   [facility]  [address] [id number]       [address]       [place], [place]  [address]       Ph: [phone] or [phone]       Fax: [phone]   RxID:   [id number] CIPRO 500 MG TABS (CIPROFLOXACIN HCL) 1 tab by mouth two times a day x 7 days  #14 x 0   Entered and Authorized by:   Marisue Ivan  MD   Signed by:  Marisue Ivan  MD on 07/29/2009   Method used:   [facility]        [facility]  [address] [id number]       [address]       [place], [place]  [address]       Ph: [phone] or [phone]       Fax: [phone]   RxID:   [id number]   Laboratory Results   Urine Tests  Date/Time Received: July 29, 2009 11:56 AM  Date/Time Reported: July 29, 2009 12:16 PM   Routine Urinalysis   Color: yellow Appearance: Hazy Glucose: negative   (Normal Range: Negative) Bilirubin: small-icto negative   (Normal Range: Negative) Ketone: trace (5)   (Normal Range: Negative) Spec. Gravity: 1.015   (Normal Range: 1.003-1.035) Blood: small   (Normal Range: Negative) pH: 5.5   (Normal Range: 5.0-8.0) Protein: 30   (Normal Range: Negative) Urobilinogen: 0.2   (Normal Range: 0-1) Nitrite: negative   (Normal Range: Negative) Leukocyte Esterace: moderate   (Normal Range: Negative)  Urine Microscopic WBC/HPF: 20+ RBC/HPF: 20+ Bacteria/HPF: 1+ cocci Mucous/HPF: 1+ Epithelial/HPF: 1-5 Casts/LPF: >20 hyaline    Comments: ...........test performed by...........Marland KitchenTerese Door, CMA       Prevention & Chronic Care Immunizations   Influenza vaccine: Fluvax MCR  (07/20/2009)   Influenza vaccine due: 06/09/2009    Tetanus booster: Not documented    Pneumococcal vaccine: Not documented    H. zoster vaccine: Not documented  Colorectal Screening   Hemoccult: Not documented    Colonoscopy: Not documented  Other Screening   Pap smear: Not documented    Mammogram: Not documented    DXA bone density  scan: Not documented   Smoking status: never  (07/20/2009)  Lipids   Total Cholesterol: 184  (09/22/2008)   LDL: 106  (09/22/2008)   LDL Direct: 143.7  (07/30/2007)   HDL: 49.4  (09/22/2008)   Triglycerides: 141  (09/22/2008)    SGOT (AST): 21  (09/22/2008)   SGPT (ALT): 17  (09/22/2008)   Alkaline phosphatase: 45  (09/22/2008)   Total bilirubin: 0.7  (09/22/2008)  Hypertension   Last Blood Pressure: 136 / 88  (07/29/2009)   Serum creatinine: 0.7  (10/24/2008)   Serum potassium 3.7  (10/24/2008)    Hypertension flowsheet reviewed?: Yes   Progress toward BP goal: At goal  Self-Management Support :   Personal Goals (by the next clinic visit) :      Personal blood pressure goal: 140/90  (07/29/2009)     Personal LDL goal: 70  (07/20/2009)    Patient will work on the following items until the next clinic visit to reach self-care goals:     Medications and monitoring: take my medicines every day, check my blood pressure, bring all of my medications to every visit  (07/29/2009)     Eating: drink diet soda or water instead of juice or soda, eat more vegetables, use fresh or frozen vegetables, eat foods that are low in salt, eat baked foods instead of fried foods, eat fruit for snacks and desserts, limit or avoid alcohol  (07/29/2009)    Hypertension self-management support: BP self-monitoring log, Written self-care plan, Education handout  (07/29/2009)   Hypertension self-care plan printed.   Hypertension education handout printed    Lipid self-management support: Written self-care plan  (07/20/2009)

## 2010-10-13 LAB — BASIC METABOLIC PANEL
BUN: 16 mg/dL (ref 6–23)
BUN: 17 mg/dL (ref 6–23)
CO2: 26 mEq/L (ref 19–32)
CO2: 29 mEq/L (ref 19–32)
Calcium: 9.4 mg/dL (ref 8.4–10.5)
Chloride: 101 mEq/L (ref 96–112)
Chloride: 104 mEq/L (ref 96–112)
Creatinine, Ser: 0.77 mg/dL (ref 0.4–1.2)
GFR calc Af Amer: >60 mL/min (ref >60)
GFR calc Af Amer: >60 mL/min (ref >60)
GFR calc non Af Amer: >60 mL/min (ref >60)
Glucose, Bld: 134 mg/dL — ABNORMAL HIGH (ref 70–99)
Potassium: 3.6 mEq/L (ref 3.5–5.1)
Potassium: 3.6 mEq/L (ref 3.5–5.1)
Sodium: 137 mEq/L (ref 135–145)
Sodium: 137 mEq/L (ref 135–145)

## 2010-10-13 LAB — POCT I-STAT, CHEM 8
Chloride: 103 mEq/L (ref 96–112)
Creatinine, Ser: 0.8 mg/dL (ref 0.4–1.2)
HCT: 42 % (ref 36.0–46.0)
Hemoglobin: 14.3 g/dL (ref 12.0–15.0)
Potassium: 3.5 mEq/L (ref 3.5–5.1)
Sodium: 143 mEq/L (ref 135–145)

## 2010-10-13 LAB — COMPREHENSIVE METABOLIC PANEL
Alkaline Phosphatase: 50 U/L (ref 39–117)
BUN: 18 mg/dL (ref 6–23)
CO2: 28 mEq/L (ref 19–32)
Chloride: 100 mEq/L (ref 96–112)
GFR calc non Af Amer: >60 mL/min (ref >60)
Glucose, Bld: 117 mg/dL — ABNORMAL HIGH (ref 70–99)
Potassium: 3.6 mEq/L (ref 3.5–5.1)
Total Bilirubin: 0.8 mg/dL (ref 0.3–1.2)

## 2010-10-13 LAB — CBC
HCT: 42.4 % (ref 36.0–46.0)
HCT: 37.9 % (ref 36.0–46.0)
HCT: 40.1 % (ref 36.0–46.0)
Hemoglobin: 14.2 g/dL (ref 12.0–15.0)
Hemoglobin: 12.6 g/dL (ref 12.0–15.0)
Hemoglobin: 13.4 g/dL (ref 12.0–15.0)
MCHC: 33.3 g/dL (ref 30.0–36.0)
MCV: 92.3 fL (ref 78.0–100.0)
Platelets: 584 10*3/uL — ABNORMAL HIGH (ref 150–400)
RBC: 4.38 MIL/uL (ref 3.87–5.11)
RDW: 14.6 % (ref 11.5–15.5)
WBC: 13.7 10*3/uL — ABNORMAL HIGH (ref 4.0–10.5)
WBC: 11.1 10*3/uL — ABNORMAL HIGH (ref 4.0–10.5)

## 2010-10-13 LAB — PROTIME-INR
INR: 0.95 (ref 0.00–1.49)
Prothrombin Time: 12.6 seconds (ref 11.6–15.2)

## 2010-10-13 LAB — DIFFERENTIAL
Eosinophils Absolute: 0.1 10*3/uL (ref 0.0–0.7)
Lymphocytes Relative: 7 % — ABNORMAL LOW (ref 12–46)
Lymphs Abs: 1.9 10*3/uL (ref 0.7–4.0)
Monocytes Absolute: 1.5 10*3/uL — ABNORMAL HIGH (ref 0.1–1.0)
Monocytes Relative: 8 % (ref 3–12)
Monocytes Relative: 8 % (ref 3–12)
Neutro Abs: 15.6 10*3/uL — ABNORMAL HIGH (ref 1.7–7.7)

## 2010-11-22 ENCOUNTER — Other Ambulatory Visit: Payer: Self-pay | Admitting: Cardiology

## 2010-11-23 NOTE — Consult Note (Signed)
NAMEALYSSE, RATHE          ACCOUNT NO.:  [id number]   MEDICAL RECORD NO.:  [id number]          PATIENT TYPE:  INP   LOCATION:  3735                         FACILITY:  [facility]   PHYSICIAN:  Jesse Sans. Wall, MD, FACCDATE OF BIRTH:  May 10, 1934   DATE OF CONSULTATION:  DATE OF DISCHARGE:                                 CONSULTATION   We were asked by the [facility] Team to consult on Natalie [name]  with chest discomfort.   HISTORY OF PRESENT ILLNESS:  [name], African American  female who came to the [facility] today after waking up at  4:00 a.m. this morning with substernal chest discomfort.  She said it  was very similar what she had several years ago, at which time she had a  99% right coronary artery stenosis, stented with a Cypher stent.  At  that time, she had a residual 70% in the AV circumflex, 50% in the  marginal, and a 90% in a very small marginal.   She has had problems with compliance in the past per Dr. Ival Bible  notes.  She says she is compliant with her medications.   She took a nitroglycerin at home which was old.  There was no relief.  She received nitroglycerin at the [facility], and it helped.  She is still having some low-grade discomfort.   Her EKG shows normal sinus rhythm with nonspecific ST-segment changes.   Her past medical history, she is intolerant to PENICILLIN and STATINS.   Her current meds are:  1. Lopressor 50 mg p.o. b.i.d.  2. Hydrochlorothiazide 12.5 mg a day.  3. Aspirin 325 mg a day.  4. Plavix 75 mg a day.  5. Aciphex p.r.n.  6. Calcium and vitamin D.  7. Voltaren 75 mg p.o. b.i.d.  8. Tums.   Past medical history is also significant for:  1. Hypertension.  2. Hyperlipidemia.  3. Tobacco use but she quit 12 years ago.  4. She has gastroesophageal reflux.  5. History of colon CA.   She has had a partial colectomy, partial hysterectomy, and a Port-A-Cath  in the past.   SOCIAL  HISTORY:  [name] with her husband.  She is a  retired Data processing manager.   Her family history is negative except for CVA in her father in his 67s.   REVIEW OF SYSTEMS:  Other than the HPI, is negative.  She does have  occasional reflux symptoms and some arthralgia.   PHYSICAL EXAMINATION:  VITAL SIGNS:  Blood pressure is 157/81.  Her  pulse 64 and regular.  Her respiratory rate is 16, temperature is 97.0,  sats 100% on room air.  GENERAL APPEARANCE:  She is in no acute distress.  SKIN:  Warm and dry.  NEUROLOGIC:  Alert and oriented x3.  She is a bit irritable.  Neuro exam  is intact.  HEENT:  Unremarkable.  NECK:  Supple.  Carotid upstrokes are equal bilaterally without bruits.  No JVD.  Thyroid is not enlarged.  CARDIOVASCULAR:  Normal S1 and S2.  No murmur, rub, or gallop.  ABDOMEN:  Soft, good bowel sounds.  EXTREMITIES:  No cyanosis, clubbing, or edema.  Pulses are intact.   EKG again shows nonspecific ST-segment changes.   Her first set of enzyme or point-of-care markers were negative.   ASSESSMENT:  Probable unstable angina pectoris:  Rule out myocardial  infarction.  I am concerned about progression of atrioventricular  circumflex disease.  It could be a small margins that is causing angina;  however, it is a very small vessel and will have to be only amenable to  medical therapy.   The patient does not want catheterization unless her enzymes are  positive like last time.  We will check cardiac enzymes.  If these are  negative, she can have a risk stratification Myoview as an outpatient.  If they are positive, then we will proceed with catheterization with her  approval.      Maisie Fus C. Daleen Squibb, MD, [facility]  Electronically Signed     TCW/MEDQ  D:  05/30/2008  T:  05/31/2008  Job:  [id number]

## 2010-11-23 NOTE — Assessment & Plan Note (Signed)
[facility]                            CARDIOLOGY OFFICE NOTE   Natalie [name], Natalie [name]                 MRN:          [id number]  DATE:07/30/2007                            DOB:          July 27, 1933    PRIMARY CARE PHYSICIAN:  Lorelle Formosa, M.D.   REASON FOR VISIT:  Cardiology follow up.   HISTORY OF PRESENT ILLNESS:  Ms. [name] comes in for a 61-month  visit.  She states that she has been exercising regularly by walking on  a daily basis; and has also been watching her diet, nearly cutting out  meats.  She decided to stop taking Crestor and did not fill the Altace  that I suggested at her last visit.  Fortunately, her blood pressure,  today, actually looks good.  We do not have a repeat assessment of her  lipid status.  We did discuss this, some today, and she is willing to  try a different statin medicine if her LDL is not optimal; although  states that she did not tolerate either Vytorin or Crestor due to a  tingling discomfort in her extremities.  She is not reporting any angina  at this time.   ALLERGIES:  PENICILLIN.   PRESENT MEDICATIONS:  1. Enteric-coated aspirin 325 mg p.o. daily.  2. Plavix 75 mg p.o. daily.  3. Metoprolol 50 mg p.o. b.i.d.  4. Sublingual nitroglycerin 0.4 mg p.r.n.  5. AcipHex p.r.n.   REVIEW OF SYSTEMS:  As described in present illness.  Otherwise negative  except as pertinent.   PHYSICAL EXAMINATION:  Blood pressure 126/78, heart rate 78, weight is  154 pounds which is stable.  The patient is comfortable in no acute  distress.  HEENT:  Conjunctivae and lids normal.  Oropharynx clear.  NECK:  Supple.  No elevated jugular venous pressure, no loud bruits, no  thyromegaly is noted.  LUNGS:  Clear without work of breathing at rest.  CARDIAC EXAM:  Reveals a regular rate and rhythm.  ABDOMEN:  Soft, nontender.  Normoactive bowel sounds.  EXTREMITIES:  Exhibit no significant pitting edema.  Distal pulses  are  full.  SKIN:  Warm and dry.  MUSCULOSKELETAL:  No kyphosis is noted.  NEUROPSYCHIATRIC:  The patient is alert and oriented x3.  Affect is  normal.   IMPRESSION AND RECOMMENDATIONS:  1. Cardiovascular disease status post drug-eluting stent placed in the      right coronary artery with medically managed circumflex disease.      The patient is not reporting any angina and had a low-risk Myoview      in July of 2008.  She will continue on her present medications.  I      had recommended an ACE inhibitor at her last visit, although her      blood pressure does look better today.  I have encouraged her,      otherwise, to continue an exercise and diet, and we will get a      repeat set of lipids to reassess her LDL.  She is amenable to      trying a different statin medicine if  her numbers are not goal.      Otherwise, I would like to see her back over the next 6 months.  2. Continue regular follow up with Dr. Ronne Binning.     Jonelle Sidle, MD  Electronically Signed    SGM/MedQ  DD: 07/30/2007  DT: 07/30/2007  Job #: [id number]   cc:   Lorelle Formosa, M.D.

## 2010-11-23 NOTE — Assessment & Plan Note (Signed)
[facility]                            CARDIOLOGY OFFICE NOTE   JALA, DUNDON                 MRN:          [id number]  DATE:06/18/2008                            DOB:          Jun 09, 1934    Ms. Cutillo comes in today after being discharged from the hospital  with chest pain.  She ruled out for myocardial infarction.  I saw her in  consultation during that admission.   Noteworthy, blood work with a total cholesterol of 233, LDL of 155, HDL  53, total cholesterol HDL ratio 4.4.  Her potassium was borderline low  at 3.5.  The rest of her blood work was unremarkable.   She has a history as outlined by Dr. Nona Dell on her last visit.  She has had a previous drug-eluting stent to the right coronary artery  with medically managed circumflex disease.  She had a low-risk Myoview  in July 2008.   She also has a history of hyperlipidemia.  She has been intolerant of  all of the STATINS, except pravastatin.  She rarely admits she does not  take it on a regular basis.  She states, she thinks, it hurts her  memory.   MEDICATIONS:  Currently on aspirin 325 a day, aspirin 75 mg a day,  metoprolol 50 mg p.o. b.i.d., HCTZ 12.5 mg a day, and pravastatin 40 mg  a day intermittently.   PHYSICAL EXAMINATION:  VITAL SIGNS:  Her blood pressure today is 150/80,  her pulse is 70 and regular.  Weight is 151.  HEENT:  Normal.  She has a little bit of a depressed affect, but is very  bright.  NECK:  Carotid upstrokes are equal bilateral without bruits.  No JVD.  Thyroid is not enlarged.  Trachea is midline.  LUNGS:  Clear to auscultation and percussion.  HEART:  Regular rate and rhythm.  No gallop.  ABDOMEN:  Soft.  Good bowel sounds.  There is no cyanosis, clubbing, or  edema.  Pulses are intact.  NEUROLOGIC:  Intact.  SKIN:  Unremarkable.   Electrocardiogram shows sinus rhythm with a short PR interval and LVH  with strain.  The EKG is  stable.   I have had a long talk with the patient today about statins and reduced  risk of multi-infarct dementia and not memory loss per se, as she is  worried about.  I have also pointed out that even though she cannot take  any statins better than no statin.  Hopefully, get her LDL below 100, if  she will take it regularly.   Will continue other medications.   We also talked about her chest discomfort, which sounds like  gastroesophageal reflux.  She states it is triggered by raw vegetables.  She has been on Aciphex in the past, and I have told her that if she  starts having more problems with this, perhaps she will have to go on an  acid reducer.  I would recommend ranitidine 150 b.i.d.  She is on  Plavix, which is noteworthy.   I will see her back again in 3 months.   She  promised me she will take her pravastatin at bedtime.  After she has  been on this for 6 weeks, we will recheck blood work.     Thomas C. Daleen Squibb, MD, [facility]  Electronically Signed    TCW/MedQ  DD: 06/18/2008  DT: 06/19/2008  Job #: [id number]

## 2010-11-23 NOTE — Assessment & Plan Note (Signed)
[facility]                            CARDIOLOGY OFFICE NOTE   Natalie, [name]                 MRN:          [id number]  DATE:02/06/2007                            DOB:          April 04, 1934    PRIMARY CARE PHYSICIAN:  Natalie [name], M.D.   REASON FOR VISIT:  Cardiac followup.   HISTORY OF PRESENT ILLNESS:  Ms. Natalie [name] returns to review her stress  test. I saw her recently in early July. She reports feeling better since  then without any significant chest pain. Her adenosine Myoview showed  abnormal electrocardiographic changes which had been noted previously  but normal perfusion and normal ejection fraction of 67%. I added back  Imdur at the last visit. We have reviewed these results today, and at  this point, I would anticipate continuing medical therapy. I note again  that her blood pressure is elevated, and we talked about adding an  additional medication (ACE inhibitor) for better control and risk  reduction.   ALLERGIES:  PENICILLIN.   PRESENT MEDICATIONS:  1. Enteric-coated aspirin 325 mg p.o. daily.  2. Plavix 75 mg p.o. daily.  3. Lopressor 50 mg p.o. b.i.d.  4. Imdur 30 mg p.o. daily.  5. Crestor 10 mg p.o. daily.  6. Nitroglycerin 0.4 mg sublingual p.r.n.  7. Aciphex p.r.n.   REVIEW OF SYSTEMS:  As described in history of present illness.   EXAMINATION:  Blood pressure 157/89, heart rate 87, weight 153 pounds.  The patient is comfortable on no acute distress.  Examination of the neck reveals no elevated jugular venous pressure  without bruits.  LUNGS:  Are clear.  HEART EXAM:  Reveals a regular rate and rhythm without murmur or gallop.  EXTREMITIES:  Show no pitting edema.   IMPRESSION:  1. Coronary artery disease with previous non-ST-elevation myocardial      infarction status post drug-eluting stent placement of the right      coronary artery with medically managed circumflex disease. Recent  followup Myoview was stable without perfusion evidence of ischemia      and normal ejection fraction. I would therefore anticipate      continued medical therapy. In addition to her present regimen, I      adding Altace 5 mg p.o. daily for better blood pressure control. We      will have a followup BMET in two weeks. I will plan to see her back      over the next six months for symptom review.  2. Hyperlipidemia, on Crestor. She seems to be tolerating this well.      We will plan followup lipids and liver function tests down the      road.     Jonelle Sidle, MD  Electronically Signed    SGM/MedQ  DD: 02/06/2007  DT: 02/07/2007  Job #: [id number]   cc:   Natalie [name], M.D.

## 2010-11-23 NOTE — Assessment & Plan Note (Signed)
[facility]                            CARDIOLOGY OFFICE NOTE   Natalie [name], Natalie [name]                 MRN:          [id number]  DATE:01/16/2007                            DOB:          11-15-1933    PRIMARY CARE PHYSICIAN:  Billee Cashing, M.D.   REASON FOR VISIT:  Cardiac followup.   HISTORY OF PRESENT ILLNESS:  Natalie [name] returns for a routine visit.  She has had more dyspnea on exertion and occasionally a feeling of  indigestion for which she had taken Tums, occasionally with benefit.  She is no longer taking Imdur, which we had given her in the past.  She  has been tolerating Crestor and had a significant improvement in her LDL  from 151 down to 77 with normal liver function tests.  Electrocardiogram  today shows inferolateral ST segment depression which is more prominent  compared to previous tracing, although she has had repolarization  abnormalities noted in the past.  Her blood pressure is less well  controlled today.  Her last Myoview 1 year ago indicated no evidence of  ischemia.   ALLERGIES:  No known drug allergies.   PRESENT MEDICATIONS:  1. Enteric-coated aspirin 325 mg p.o. daily.  2. Plavix 75 mg p.o. daily.  3. Lopressor 50 mg p.o. b.i.d.  4. Crestor 10 mg p.o. daily.  5. P.r.n. AcipHex and Tums.  6. Sublingual nitroglycerin p.r.n.   REVIEW OF SYSTEMS:  As described in the history of present illness.   EXAMINATION:  Blood pressure is 172/88, heart rate is 79, weight is 153  pounds.  The patient is comfortable, in no acute distress.  HEENT:  Normal.  NECK:  Supple.  No elevated jugular venous pressure or loud bruits, no  thyromegaly is noted.  LUNGS:  Clear.  No labored breathing at rest.  CARDIAC:  Reveals a regular rate and rhythm, no S3 gallop.  ABDOMEN:  Soft, nontender, normal active bowel sounds.  EXTREMITIES:  Show no significant pitting edema.  Skin is warm and dry.  Distal pulses are 2+.  MUSCULOSKELETAL:  No kyphosis is noted.  NEUROPSYCHIATRIC:  The patient is alert and oriented x3.   IMPRESSION/RECOMMENDATION:  1. Coronary artery disease status post non-ST-elevation myocardial      infarction with drug-eluting stent placement to the mid right      coronary artery and medically managed circumflex disease.  I think      she has the potential for angina based on her residual disease, and      we can certainly tighten up her anti-ischemic medicines with the re-      addition of Imdur.  She may need additional blood pressure control      as well, and an ACE inhibitor might be ultimately a good choice.      Will plan a followup adenosine Myoview on medical therapy and plan      to see her back in the office for review in the next few weeks.      Her lipids look much better and she is tolerating Crestor at this      point.  2. Further plans to follow.     Jonelle Sidle, MD  Electronically Signed    SGM/MedQ  DD: 01/16/2007  DT: 01/17/2007  Job #: [id number]   cc:   Lorelle Formosa, M.D.

## 2010-11-26 NOTE — H&P (Signed)
NAME:  Natalie [name], Natalie [name] NO.:  [id number]   MEDICAL RECORD NO.:  [id number]          PATIENT TYPE:  EMS   LOCATION:  MAJO                         FACILITY:  [facility]   PHYSICIAN:  Jonelle Sidle, M.D. LHCDATE OF BIRTH:  Apr 22, 1934   DATE OF ADMISSION:  07/15/2005  DATE OF DISCHARGE:                                HISTORY & PHYSICAL   PRIMARY CARDIOLOGIST:  Dr. Simona Huh (new)   REASON FOR ADMISSION:  Natalie [name] is a 77 year old female with no prior  cardiac history with cardiac risk factors notable for hypertension, long-  standing history of tobacco smoking, and age who now presents to the  emergency room with symptoms suggestive of crescendo angina pectoris.   Patient reports development of exertional chest discomfort over the past  four weeks or so.  These were typically precipitated by exertion such as  walking up stairs or up hill or associated with activities around the house.  Her chest discomfort would typically resolve with rest within a few minutes.   She initially thought that this was heartburn and was subsequently placed on  Aciphex.  However, her symptoms did not abate and, in fact, appeared to  worsen.  This morning she awoke early and noted mid sternal chest discomfort  described as dull (9/10) with some associated right shoulder discomfort,  dyspnea, and diaphoresis.  She walked to the bathroom which exacerbated her  discomfort and then informed her husband who promptly took her to the  emergency room.  She states that her chest discomfort this morning is more  severe than longer lasting than what she has experienced in the past.  Of  note, she has had some episodes in the past few weeks with chest pain upon  awakening but in general they would usually occur with exertion.   On presentation to the emergency room patient was initially treated with a  GI cocktail which reportedly provided some initial relief.  However, her  symptoms  recurred and she was treated with aspirin and then placed on  nitroglycerin paste.  An initial electrocardiogram shows a scalloped ST  depression in the inferolateral leads.  Initial troponin is 0.07 with a  follow-up of 0.11.   ALLERGIES:  PENICILLIN.   CURRENT MEDICATIONS:  1.  Norvasc 10 daily.  2.  Aciphex 20 mg daily.   PAST MEDICAL HISTORY:  1.  Hypertension.  2.  History of right colon cancer status post resection 2000 [facility]).  3.  Partial hysterectomy.   SOCIAL HISTORY:  Patient lives in [place] with her husband.  She is a  retired Data processing manager.  She has not smoked tobacco in 10 years, but  reports a prior approximately 15-pack-year history of tobacco.  She denies  alcohol or illicit drug use.   FAMILY HISTORY:  Patient does not know what happened to her biological  mother.  Her father died in his early 77s, complications from stroke.  Her  siblings have no known heart disease.   REVIEW OF SYSTEMS:  Reports a recent exertional dyspnea, but no orthopnea,  paroxysmal nocturnal dyspnea, or lower extremity edema.  She denies  any  prior history of myocardial infarction, congestive heart failure, or stroke.  She has not had any recent fevers, chills, sweats, or productive cough.  Otherwise, as noted per HPI, remaining systems negative.   PHYSICAL EXAMINATION:  VITAL SIGNS:  Blood pressure 148/85, pulse 100,  temperature 96.8, respirations 28, saturations 99% on room air on admission.  GENERAL:  77 year old female in no apparent distress.  HEENT:  Normocephalic, atraumatic.  NECK:  Preserved bilateral carotid pulses without bruits.  No JVD.  LUNGS:  Diminished breath sounds in bases and throughout without crackles or  wheezes.  HEART:  Regular rate and rhythm (S1, S2).  Positive S4.  No murmurs.  ABDOMEN:  Soft, nontender with intact bowel sounds.  No bruits.  EXTREMITIES:  Preserved bilateral femoral pulses without bruits.  Intact  distal pulses without  edema.  NEUROLOGIC:  No focal deficit.   Admission chest x-ray:  No acute disease.   Admission electrocardiogram:  Normal sinus rhythm at 89 BPM with normal  axis, scalloped ST depression in inferolateral leads.   LABORATORY DATA:  Hemoglobin 13.2, hematocrit 39, WBC 8.2, platelets 546.  Cardiac enzymes (POC):  MB 3.6, 3.3; troponin I 0.07, 0.11.   IMPRESSION:  1.  Non ST elevation myocardial infarction.      1.  New onset angina pectoris with crescendo pattern.  2.  Multiple cardiac risk factors.      1.  Hypertension.      2.  History of tobacco.      3.  Age.  3.  History of gastroesophageal reflux disease.   PLAN:  Patient presents with new onset chest discomfort, principally  precipitated by exertion and relieved by rest which has exacerbated over  these past few weeks.  Her symptoms are suggestive of crescendo angina and  she presents with an abnormal electrocardiogram and initial cardiac markers  revealing mildly elevated troponin.   Patient will therefore be admitted to telemetry for further evaluation and  treatment of unstable angina with initiation of intravenous nitroglycerin  and heparin in the emergency room.  We will continue aspirin and beta  blocker as well as her home dose of Norvasc.  Will also start her on Lipitor  80 daily and check a fasting lipid profile.   Plan is to proceed with cardiac catheterization on Monday.  The risks and  benefits of the procedure have been discussed with the patient and she is  agreeable to proceed.  Of note, given the patient's age we will not initiate  a Plavix load.      Gene Serpe, P.A. [facility]    ______________________________  Jonelle Sidle, M.D. [facility]    GS/MEDQ  D:  07/15/2005  T:  07/15/2005  Job:  [id number]   cc:   Lorelle Formosa, M.D.  Fax: [phone]

## 2010-11-26 NOTE — Assessment & Plan Note (Signed)
Natalie [name]                            Natalie [name]   Natalie [name], Natalie [name]                 MRN:          [id number]  DATE:07/17/2006                            DOB:          1934-04-06    PRIMARY CARE PHYSICIAN:  Natalie [name], M.D.   REASON FOR VISIT:  Cardiac followup.   HISTORY OF PRESENT ILLNESS:  I saw Natalie [name] back in July. She is  doing relatively well stating that she is continuing to exercise and not  having any marked angina or dyspnea on exertion. She unfortunately did  not tolerate a trial of Vytorin, having not tolerated Lipitor prior to  that. She experienced leg pain with both medications which resolved  after their discontinuation. She is also not taking her Imdur at this  time. Her electrocardiogram  today shows sinus rhythm with left  ventricular hypertrophy and repolarization abnormalities. Followup  lipids show an LDL cholesterol of 151 with normal liver function tests.  Today we talked about trying Crestor and if she is unable to tolerate  this we may have to settle on Zetia alone.   ALLERGIES:  PENICILLIN.   CURRENT MEDICATIONS:  1. Enteric coated aspirin 325 mg p.o. daily.  2. Plavix 75 mg p.o. daily.  3. Lopressor 50 mg p.o. b.i.d.   REVIEW OF SYSTEMS:  As described in the history of present illness.   PHYSICAL EXAMINATION:  VITAL SIGNS:  Blood pressure 134/84, heart rate  of 73, weight is 152 pounds.  GENERAL:  The patient is comfortable in no acute distress.  HEENT:  Conjunctiva is normal. Pharynx clear.  NECK:  Supple without elevated venous pressure or loud bruits, no  thyromegaly is noted.  LUNGS:  Clear without labored breathing at rest.  CARDIAC:  Reveals a regular rate and rhythm without loud murmur or S3  gallop.  ABDOMEN:  Soft, nontender.  EXTREMITIES:  Show no significant pitting edema.   IMPRESSION/RECOMMENDATIONS:  1. Coronary artery disease status post previous non-ST  elevation      myocardial infarction with drug-eluting stent placement to the mid      right coronary artery and medically managed circumflex disease.      Followup adenosine Myoview in August of 2007 showed no scar or      ischemia with an ejection fraction of 70%. Will plan to continue      present medications and have her followup over the next 6 months.  2. In terms of her lipid status, will try Crestor 10 mg daily. If she      tolerates this will plan to      continue and check a fasting lipid profile and liver function tests      in about 8 weeks. Otherwise more than likely, we will have to      settle on Zetia alone and diet modification.     Natalie Sidle, MD  Electronically Signed    SGM/MedQ  DD: 07/17/2006  DT: 07/17/2006  Job #: [id number]

## 2010-11-26 NOTE — Cardiovascular Report (Signed)
NAMESHAKHIA, GRAMAJO          ACCOUNT NO.:  [id number]   MEDICAL RECORD NO.:  [id number]          PATIENT TYPE:  INP   LOCATION:  2917                         FACILITY:  [facility]   PHYSICIAN:  Arturo Morton. Riley Kill, M.D. [facility] OF BIRTH:  11/12/1933   DATE OF PROCEDURE:  07/15/2005  DATE OF DISCHARGE:                              CARDIAC CATHETERIZATION   INDICATIONS:  Ms. Tine is a 77 year old woman who has presented with  chest pain.  Her enzymes were positive.  She was brought to the lab  emergently.  She was enrolled in the [facility] ACS trial.   PROCEDURES:  1.  Left heart catheterization.  2.  Selective coronary arteriography.  3.  Selective left ventriculography.  4.  Subclavian angiography.  5.  Percutaneous intervention of the right coronary artery.   DESCRIPTION OF PROCEDURE:  The patient was brought to the catheterization  laboratory and prepped and draped in the usual fashion.  Through an anterior  puncture, the right femoral artery was entered using a Smart needle.  Following this, views of the right and left coronary arteries were obtained  in multiple angiographic projections.  Central aortic and left ventricular  pressures were measured with the pigtail.  Ventriculography was formed in  the RAO projection.  Following this, subclavian angiography was performed.  We then reviewed the films in detail, and she does not have critical LAD  disease.  She does have moderate disease of the circumflex, but it also is  not critical.  There is a small marginal distally that has 90% ostial  narrowing but again is not critical.  Based on this, it was our feeling that  the best approach would be to consider percutaneous intervention of the  right coronary artery.  I discussed this with the patient and then  subsequently with her family.  We elected to proceed, and she was enrolled  in the SEPIA trial.  After appropriate anticoagulation, a JR-4 guiding  catheter was utilized.   We initially used Prowater wire and were able to get  distally.  A 2.25 mm 20 balloon was taken down and dilated throughout the  midvessel.  We had some difficulty getting a 33 x to 2.75 Cypher stent into  the location, and we ended up using a Luge wire as a buddy wire and were  subsequently able to get the stent down.  Following this, the stent was  deployed at approximately 13 atmospheres.  A second overlapping stent 13 mm  in length was placed in the proximal lesion to cover the entire area.  We  then did post dilatation using a 3.0 Quantum Maverick balloon, which was  taken up to 16 atmospheres throughout the course of the stented area.  There  was no evidence of edge tear, and the final angiographic result was  excellent.  The patient was then prepped carefully, gloves were exchanged.  Angio-Seal closure device was utilized to close the right femoral artery.  She was then taken back to the Transitional Care Unit satisfactory clinical  condition.   HEMODYNAMIC DATA.:  1.  Central aortic pressure 149/79, mean 105.  2.  Left ventricular pressure 143/15.  3.  No gradient on pullback across aortic valve.   ANGIOGRAPHIC DATA.:  1.  Ventriculography was performed in the RAO projection.  Overall systolic      function was preserved.  No segmental abnormalities of contraction were      identified, and the LV function was vigorous.  2.  There is moderate heavy calcification of the right coronary artery.  The      proximal vessel demonstrates some segmental plaquing of about 20%.  In      the midvessel was a diffuse area of narrowing that is 99% calcified in      some locations and up to 90% just before the distal segment.  At the      distal segment, the vessel opens up and provides a PDA that bifurcates      and is moderately irregular.  There is a posterolateral system that is      small in caliber.  Following stenting with the overlapping Cypher      stents, the area was reduced to 0%  residual luminal narrowing.  There      was still some mild narrowing in the distal portion of the stented area,      but the lumen was large and with good flow.  3.  The left main is free of critical disease.  4.  The LAD has some mild luminal irregularity throughout and tapers      distally to very small caliber at the apical tip.  There is about 30%      segmental plaquing.  Likewise, the diagonal has mild luminal      irregularities.  5.  The circumflex comes off and provides a first marginal that has about      50% narrowing and has a fairly large caliber.  After this there is      diffuse 70% narrowing in the AV portion of the circumflex leading into a      small marginal branch that has 90% narrowing at the ostium.  The AV      circumflex is without critical disease.   The subclavian and internal mammary appear to be patent.   CONCLUSION:  1.  Preserved left ventricular function.  2.  Critical disease of the mid-right coronary artery with successful      percutaneous coronary intervention.  3.  Diffuse segmental plaquing of the midcircumflex as noted above.   DISPOSITION:  Will she will need aggressive medical therapy.  She will need  lipid-lowering therapy.  Aspirin and Plavix on a prolonged basis will be  recommended.  Long-term follow-up will be with Dr. Diona Browner.      Arturo Morton. Riley Kill, M.D. [facility]  Electronically Signed     TDS/MEDQ  D:  07/15/2005  T:  07/16/2005  Job:  [id number]   cc:   Cardiovascular Laboratory   Jonelle Sidle, M.D. [facility]  [address]  [place]  [place] [address]

## 2010-11-26 NOTE — Op Note (Signed)
   NAME:  [name], Natalie                    ACCOUNT NO.:  [id number]   MEDICAL RECORD NO.:  [id number]                   PATIENT TYPE:  AMB   LOCATION:  ENDO                                 [facility]ACILITY[facility]  [facility]   PHYSICIAN:  Danise Edge, M.D.                DATE OF BIRTH:  [birth year]   DATE OF PROCEDURE:  05/07/2003  DATE OF DISCHARGE:                                 OPERATIVE REPORT   PROCEDURE:  Colonoscopy.   INDICATIONS:  Mrs. [name] is a 77 year old female, born 05/07/1934.  In October 2000, Mrs. [name] was diagnosed with cancer of the  cecum by colonoscopy.  She underwent segmental right colon resection.  Her  colonoscopy one year postop was normal.  She is scheduled to undergo a  screening colonoscopy with polypectomy to prevent recurrent colon cancer.   ENDOSCOPIST:  Danise Edge, M.D.   PREMEDICATION:  Versed 6 mg, Demerol 30 mg.   DESCRIPTION OF PROCEDURE:  After obtaining informed consent, [name]  was placed in the left lateral decubitus position.  I administered  intravenous Demerol and intravenous Versed to achieve conscious sedation for  the procedure.  The patient's blood pressure, oxygen saturation and cardiac  rhythm were monitored throughout the procedure and documented in the medical  record.   Anal inspection was normal.  Digital rectal exam was normal.  The Olympus  adjustable pediatric  colonoscope was introduced into the rectum and  advanced to the ileo right colon surgical anastomosis.  Colonic preparation  for the exam today was excellent.  Rectum:  From the proximal rectum, a 1 mm sessile polyp was removed with the  cold biopsy forceps.  Sigmoid colon and descending colon:  Normal.  Splenic flexure:  Normal.  Transverse colon:  Normal.  Hepatic flexure:  Normal.  Ascending colon:  Normal.  Ileo right colonic surgical anastomosis:  Normal.    ASSESSMENT:  1. Cancer of the cecum removed October 2000.  2. From  the proximal rectum, a diminutive polyp was removed with the cold     biopsy forceps.   RECOMMENDATIONS:  Repeat colonoscopy in five years.                                               Danise Edge, M.D.    MJ/MEDQ  D:  05/07/2003  T:  05/07/2003  Job:  [id number]   cc:   Valentino Hue. Magrinat, M.D.  [address] [facility]  [place]  [place] [address]  Fax: [phone]   Lorelle Formosa, M.D.  [phone] [address]  [place]  [place] [address]  Fax: [phone]

## 2010-11-26 NOTE — Procedures (Signed)
[facility]  Patient:    Natalie [name], Natalie [name]                MRN: [id number] Proc. Date: 06/05/00 Attending:  Verlin Grills, M.D. CC:         Valentino Hue. Magrinat, M.D.  Lorelle Formosa, M.D.  Marnee Spring. Wiliam Ke, M.D.   Procedure Report  REFERRING PHYSICIAN:  Valentino Hue. Magrinat, M.D.  PROCEDURE INDICATION:  Natalie [name] (date of birth 05/18/2034) is a 77 year old female who was diagnosed with adenocarcinoma of the cecum approximately one year ago.  She has undergone a right colon resection followed by chemotherapy.  She is due for surveillance colonoscopy.  I discussed with [name] the complications associated with colonoscopy and polypectomy, including intestinal bleeding and intestinal perforation. [name] has signed the operative permit.  ENDOSCOPIST:  Verlin Grills, M.D.  PREMEDICATION:  Demerol 50 mg and Versed 6 mg.  ENDOSCOPE:  Olympus pediatric colonoscope.  DESCRIPTION OF PROCEDURE:  After obtaining informed consent, the patient was placed in the left lateral decubitus position.  I administered intravenous Demerol and intravenous Versed to achieve conscious sedation for the procedure.  The patients blood pressure, oxygen saturation, and cardiac rhythm were monitored throughout the procedure and documented in the medical record.  Anal inspection was normal.  The digital rectal examination was normal.  The Olympus pediatric video colonoscope was introduced into the rectum and under direct vision advanced to the ileal right colonic surgical anastomosis. Colonic preparation for the exam today was excellent.  Rectum normal.  Sigmoid colon and descending colon normal.  Splenic flexure normal.  Transverse colon normal.  Hepatic flexure normal.  Ascending colon normal.  Ileal right colonic anastomosis normal.  ASSESSMENT:  Normal proctocolonoscopy to the ileal right colonic  surgical anastomosis post surgery for adenocarcinoma of the cecum.  RECOMMENDATIONS:  Repeat colonoscopy in three years. DD:  06/05/00 TD:  06/05/00 Job: [id number]

## 2010-11-26 NOTE — Discharge Summary (Signed)
NAMEFRANKLIN, CLAPSADDLE          ACCOUNT NO.:  [id number]   MEDICAL RECORD NO.:  [id number]          PATIENT TYPE:  INP   LOCATION:  2908                         FACILITY:  [facility]   PHYSICIAN:  Charlton Haws, M.D.     DATE OF BIRTH:  February 20, 1934   DATE OF ADMISSION:  07/15/2005  DATE OF DISCHARGE:  07/17/2005                           DISCHARGE SUMMARY - REFERRING   DISCHARGE DIAGNOSES:  1.  Non-Q-wave myocardial infarction, status post drug-eluting stenting to      the right coronary artery x2.  2.  Hyperlipidemia.  3.  Hypokalemia.  4.  History of hypertension.  5.  Remote tobacco use.   PROCEDURES PERFORMED:  1.  Cardiac catheterization as described.  2.  Stenting to the right coronary artery as described.   SUMMARY OF HISTORY:  Ms. Iannello is a 77 year old white female who  presented to the emergency room with greater than 4 weeks of chest  discomfort precipitated by exertion and relieved with rest.  She has also  had significant dyspnea on exertion as well.  She was initially diagnosed  with GERD and prescribed a proton pump inhibitor; however, her symptoms  continued to increase in intensity.  Recently, she has had a few episodes at  rest.  When she awoke earlier the morning of admission, she had 9/10 chest  discomfort radiating to her shoulder associated with shortness of breath and  diaphoresis.  In the emergency room, she was initially prescribed a GI  cocktail with questionable relief.  She was also treated with aspirin and  Lopressor.  Her troponin was slightly elevated, thus her admission.   Her history is notable for remote tobacco use, hypertension, unknown lipid  status.   LABORATORY DATA:  Chest x-ray on the 5th shows no active disease.   On admission, weight was 150.   H&H were 13.2 and 39.1, normal indices, platelets of 546,000, WBC 8.2.  Subsequent hematology showed slight decline in her H&H; on the 7th it was  1.6 and 31.3, normal indices, platelets  524,000, WBC 9.5.  PTT 25, PT 12.5.  Admission sodium 137, potassium 3.4, BUN 9, creatinine 0.7, glucose 104,  normal LFTs.  Subsequent chemistries did show a low potassium on the 6th; it  was 3.0.  On the 7th, after supplementation, sodium was 139, potassium 4.0,  BUN 11, creatinine 0.8, glucose 134.  CK-MBs and relative indices x4 were  negative for a myocardial infarction; however, troponins were 0.34, 1.63,  1.63 and 1.35.  Fasting lipids showed a total cholesterol of 196,  triglycerides 140, HDL 45, LDL elevated at 123.  TSH is 1.187.   EKGs on admission showed normal sinus rhythm, normal axis, LVH.  Subsequent  EKGs were essentially the same.   HOSPITAL COURSE:  Ms. Chisom was admitted to [facility] and  placed on IV heparin by Pharmacy.  Aspirin, beta blockers and statin were  also added.  On  July 15, 2005, she underwent cardiac catheterization by  Dr. Riley Kill.  Catheterization showed complex lesions in the RCA.  Dr.  Riley Kill placed 2 CYPHER stents in the mid-distal RCA, reducing two 99%  lesions to 0%.  It is noted that she has residual disease, 20% proximal RCA,  30% proximal LAD, 50% OM, 70% mid-circumflex, 90% distal circumflex.  Catheterization site was closed with vascular closure device.  On July 16, 2005, catheterization site was intact, activity level increased.  Dr. Eden Emms  supplemented her potassium.  By July 17, 2005, she was ambulating in the  halls without difficulty and it was felt that she could be discharged home.  It was also noted that the patient was placed in the CEPIA Study.   DISPOSITION:   DIET:  She was asked to maintain a low-salt/-fat/-cholesterol diet.   ACTIVITY:  She was asked to gradually resume her activity.  She may shower  and walk up stairs, but asked to avoid driving, lifting or sexual activity  for 1 week.   WOUND CARE:  If she has any problems with her catheterization site, she was  asked to call us.   DISCHARGE  MEDICATIONS:  She was asked to bring all medicines to all  appointments.  Her medications include:  1.  Aspirin 325 mg daily.  2.  Lipitor 80 mg nightly.  3.  Plavix 75 mg daily.  4.  Lopressor 50 mg twice daily.  5.  Nitroglycerin 0.4 mg p.r.n.  6.  Aciphex as previously.   FOLLOWUP:  She will follow up with Dr. Diona Browner; she is asked to call us on  Monday for a 2-week appointment.   SPECIAL DISCHARGE INSTRUCTIONS:  She was instructed not to take her Norvasc  and in approximately 6-8 weeks, she will need fasting lipids and LFTs, since  Lipitor was initiated.  It is also noted that during the hospitalization  Cardiac Rehab did not see the patient; this should be arranged at the time  of her followup appointment.      Joellyn Rued, P.A. [facility]    ______________________________  Charlton Haws, M.D.    EW/MEDQ  D:  07/17/2005  T:  07/18/2005  Job:  [id number]   cc:   Jonelle Sidle, M.D. [facility]  [address]  [place]  [place] [address]   Lorelle Formosa, M.D.  Fax: [phone]

## 2010-11-26 NOTE — Discharge Summary (Signed)
Natalie [name], Natalie [name]          ACCOUNT NO.:  [id number]   MEDICAL RECORD NO.:  [id number]          PATIENT TYPE:  INP   LOCATION:  3735                         FACILITY:  [facility]   PHYSICIAN:  Leighton Roach McDiarmid, M.D.DATE OF BIRTH:  07/13/1933   DATE OF ADMISSION:  05/30/2008  DATE OF DISCHARGE:  05/31/2008                               DISCHARGE SUMMARY   DISCHARGE DIAGNOSES:  1. Atypical chest pain.  2. Coronary artery disease.  3. Hypertension.  4. Hyperlipidemia.   CONSULTATIONS:  [facility].   STUDIES AND PROCEDURES:  EKG which showed normal sinus rhythm.  No  evidence of acute infarction.  No ST-T or Q-wave changes.   DISCHARGE LABORATORY DATA:  Cholesterol 233, triglycerides 123, HDL 53,  and LDL 155.  Sodium 136, potassium 3.5, chloride 103, bicarb 25,  glucose 96, BUN 7, creatinine 0.6, and calcium 8.6.  White blood cell  count 5.9, hemoglobin 12.3, hematocrit 37.6, and platelets 501.  Cardiac  enzymes were negative x2.  TSH was normal at 1.532.  INR was 1.0.   HOSPITAL COURSE:  1. Atypical chest pain.  The patient presented with dull, nonradiating      chest pain, not worse with exertion nor relieved by nitro but      stated the pain was similar to her previous coronary artery disease      pain.  No changes on initial EKG or EKG in the morning.  Cardiac      enzymes were negative throughout her hospital stay.  The patient      was placed on heparin and nitro drips on admission, had already      been given aspirin 325 and took her morning metoprolol.  Cardiology      was consulted and diagnosed the patient with possible or probable      unstable angina, but the patient refused cath once her enzymes      became positive.  On post admission day #1, the patient was pain-      free, cardiac enzymes were negative, and she was amendable to an      outpatient Myoview which she will have in followup with [facility]      Cardiology.  Heparin and nitro drips were  discontinued and she was      restarted on her home aspirin and Plavix and other medications.  2. Hypertension.  Blood pressure was uncontrolled up to 192/109 on      home medications of hydrochlorothiazide and metoprolol.  We will      continue to titrate up medications at home.  3. Hyperlipidemia.  LDL was 155, not at goal but she admitted to not      taking her statin.  She agreed to go back and was discharged.   MEDICATIONS:  1. Aspirin 325 mg daily.  2. Plavix 75 mg daily.  3. Metoprolol 50 mg b.i.d.  4. Nitroglycerin 0.4 mg p.r.n.  5. Aciphex 20 mg p.r.n.  6. Hydrochlorothiazide 12.5 mg daily.  7. Pravastatin 40 mg daily.  8. Calcium/vitamin D 2 tablets daily.   CONDITION ON DISCHARGE:  Good.   FOLLOWUP:  Follow up with Dr. Burnadette Pop in 1 week and [facility]  Cardiology will call the patient for an appointment.      Norton Blizzard, M.D.  Electronically Signed      Leighton Roach McDiarmid, M.D.  Electronically Signed    SH/MEDQ  D:  08/04/2008  T:  08/04/2008  Job:  [id number]

## 2010-11-26 NOTE — Op Note (Signed)
[place]  Patient:    Natalie [name], Natalie [name]                   MRN: [id number] Proc. Date: 01/18/00 Attending:  Marnee Spring. Wiliam Ke, M.D.                           Operative Report  PREOPERATIVE DIAGNOSIS:  Retained Port-A-Cath.  POSTOPERATIVE DIAGNOSIS:  Retained Port-A-Cath.  OPERATION PERFORMED:  Removal of Port-A-Cath.  SURGEON:  Marnee Spring. Wiliam Ke, M.D.  ASSISTANT:  None.  ANESTHESIA:  Local MAC.  PROCEDURE:  Under good sedation, the skin of the chest wall was prepped and draped in usual manner.  Tissues were infiltrated with a mixture of Xylocaine and Marcaine anesthesia.  The previously used incision was reopened.  The Port-A-Cath tube was pulled up into the wound and removed.  The hole connecting it to the vein was then closed with a figure-of-eight suture of 2-0 Vicryl.  The four sutures holding the chamber in place were divided and the chamber and catheter were removed. Hemostasis was obtained with electrocautery current.  The wound was closed with subcutaneous 3-0 Vicryl and subcuticular 4-0 Dexon, Steri-Strips were applied.  Estimated blood loss minimal.  The patient received no blood and left the operating room in satisfactory condition after sponge and needle counts were verified. DD:  01/18/00 TD:  01/18/00 Job: [id number]

## 2010-11-26 NOTE — Assessment & Plan Note (Signed)
[facility]                              CARDIOLOGY OFFICE NOTE   Natalie [name], Natalie [name]                 MRN:          [id number]  DATE:02/01/2006                            DOB:          [birth year]    PRIMARY CARE PHYSICIAN:  Natalie [name], M.D.   REASON FOR VISIT:  Routine followup.   HISTORY OF PRESENT ILLNESS:  I saw Natalie [name] back in April.  At that  time we added Imdur to her medical regimen and she states that she has  tolerated this fairly well.  She is not having any prolonged anginal chest  discomfort.  She does have baseline dyspnea at NYHA Class II.  She has  coronary disease as described previously including residual disease in the  circumflex which is being managed medically.  She had overlapping drug-  eluting stents to the mid right coronary artery and continues on Plavix.   ALLERGIES:  PENICILLIN.   PRESENT MEDICATIONS:  1.  Enteric-coated aspirin 325 mg p.o. daily.  2.  Lipitor 80 mg p.o. at night.  3.  Plavix 75 mg p.o. daily.  4.  Lopressor 50 mg p.o. b.i.d.  5.  Imdur 30 mg p.o. daily.   REVIEW OF SYSTEMS:  As per history of present illness.  She states that she  has had some problems with left thigh pain that seems positional in  description.  She was wondering if it could have been due to some disk  disease and neuropathic pain as she has had trouble with this in the past.  She has also had some trouble with her urinary flow.  I have encouraged her  to see her primary care physician which she states she has not seen in quite  some time.   PHYSICAL EXAMINATION:  VITAL SIGNS: Blood pressure today is 148/70, heart  rate 61, weight 152 pounds.  She is down two pounds.  GENERAL:  She is in no acute distress today, denying any active chest pain  or resting shortness of breath.  NECK:  Examination of the neck reveals no elevated jugular venous pressure.  No loud bruits.  No thyromegaly is noted.  LUNGS:   Clear without labored  breathing.  CARDIAC:  Regular rate and rhythm without rub, murmur or gallop.  EXTREMITIES:  No significant pitting edema.   IMPRESSION/RECOMMENDATION:  1.  Coronary artery disease with history of non-ST elevations myocardial      infarction with subsequent placement of overlapping drug-eluting stents      to the mid right coronary artery and medically managed circumflex      disease as described previously.  I will plan to continue medical      therapy and proceed with a followup adenosine Myoview.  If this does not      show any major distribution of ischemia, we will ultimately see her back      in the next six months for      symptom review.  2.  Hyperlipidemia, well controlled on Lipitor.  Natalie Sidle, MD    SGM/MedQ  DD:  02/01/2006  DT:  02/01/2006  Job #:  [id number]   cc:   Natalie Formosa, MD

## 2010-12-31 ENCOUNTER — Telehealth: Payer: Self-pay | Admitting: Cardiology

## 2010-12-31 NOTE — Telephone Encounter (Signed)
Pt wants to discuss changing plavix to gentric.

## 2010-12-31 NOTE — Telephone Encounter (Signed)
Pt calling wanting to switch to generic plavix --when i phoned pt back she stated she had a call from pharmacy and they had already switched her to generic --nt

## 2011-03-31 LAB — POCT RAPID STREP A: Streptococcus, Group A Screen (Direct): NEGATIVE

## 2011-04-12 LAB — TSH: TSH: 1.532

## 2011-04-12 LAB — CK TOTAL AND CKMB (NOT AT ARMC)
CK, MB: 2.9
Relative Index: INVALID

## 2011-04-12 LAB — DIFFERENTIAL
Basophils Absolute: 0
Basophils Relative: 0
Eosinophils Absolute: 0.1
Neutrophils Relative %: 70

## 2011-04-12 LAB — HEPARIN LEVEL (UNFRACTIONATED)
Heparin Unfractionated: 0.24 — ABNORMAL LOW
Heparin Unfractionated: 0.94 — ABNORMAL HIGH
Heparin Unfractionated: 1.34 — ABNORMAL HIGH

## 2011-04-12 LAB — CARDIAC PANEL(CRET KIN+CKTOT+MB+TROPI): CK, MB: 2.4

## 2011-04-12 LAB — TROPONIN I: Troponin I: 0.01

## 2011-04-12 LAB — CBC
HCT: 36.4
MCV: 91.5
Platelets: 501 — ABNORMAL HIGH
Platelets: 516 — ABNORMAL HIGH
WBC: 5.9
WBC: 6.4

## 2011-04-12 LAB — BASIC METABOLIC PANEL
BUN: 12
BUN: 7
Calcium: 8.6
Creatinine, Ser: 0.6
Creatinine, Ser: 0.62
GFR calc non Af Amer: >60
GFR calc non Af Amer: >60
Potassium: 3.6

## 2011-04-12 LAB — LIPID PANEL
LDL Cholesterol: 155 — ABNORMAL HIGH
Triglycerides: 123

## 2011-04-21 LAB — CBC
MCHC: 33.3
RBC: 4.32
WBC: 9

## 2011-04-21 LAB — I-STAT 8, (EC8 V) (CONVERTED LAB)
BUN: 12
Bicarbonate: 27.2 — ABNORMAL HIGH
Glucose, Bld: 92
Operator id: 272551
pCO2, Ven: 46.4

## 2011-04-21 LAB — DIFFERENTIAL
Basophils Relative: 0
Monocytes Relative: 9
Neutro Abs: 6.1
Neutrophils Relative %: 68

## 2011-04-21 LAB — PROTIME-INR: INR: 0.9

## 2011-04-21 LAB — APTT: aPTT: 26

## 2011-04-25 LAB — I-STAT 8, (EC8 V) (CONVERTED LAB)
Bicarbonate: 30 — ABNORMAL HIGH
Glucose, Bld: 105 — ABNORMAL HIGH
TCO2: 31
pH, Ven: 7.397 — ABNORMAL HIGH

## 2011-04-25 LAB — POCT CARDIAC MARKERS
Myoglobin, poc: 50.2
Operator id: 151321
Troponin i, poc: <0.05

## 2011-04-25 LAB — CBC
HCT: 41.6
Hemoglobin: 13.9
MCHC: 33.4
MCV: 88
RBC: 4.73

## 2011-04-25 LAB — DIFFERENTIAL
Basophils Relative: 0
Eosinophils Absolute: 0.1
Monocytes Absolute: 0.6
Monocytes Relative: 6

## 2011-06-06 ENCOUNTER — Ambulatory Visit (INDEPENDENT_AMBULATORY_CARE_PROVIDER_SITE_OTHER): Payer: Medicare Other | Admitting: *Deleted

## 2011-06-06 VITALS — Temp 98.6°F

## 2011-06-06 DIAGNOSIS — Z23 Encounter for immunization: Secondary | ICD-10-CM

## 2011-06-07 ENCOUNTER — Ambulatory Visit: Payer: Medicare Other

## 2011-06-08 ENCOUNTER — Encounter: Payer: Self-pay | Admitting: Home Health Services

## 2011-07-25 ENCOUNTER — Ambulatory Visit (INDEPENDENT_AMBULATORY_CARE_PROVIDER_SITE_OTHER): Payer: Medicare Other | Admitting: Family Medicine

## 2011-07-25 ENCOUNTER — Ambulatory Visit
Admission: RE | Admit: 2011-07-25 | Discharge: 2011-07-25 | Disposition: A | Discharge status: Home / Self Care | Payer: Medicare Other | Source: Ambulatory Visit | Attending: Family Medicine | Admitting: Family Medicine

## 2011-07-25 ENCOUNTER — Encounter: Payer: Self-pay | Admitting: Family Medicine

## 2011-07-25 VITALS — BP 165/90 | HR 74 | Ht 67.0 in | Wt 148.0 lb

## 2011-07-25 DIAGNOSIS — M545 Low back pain, unspecified: Secondary | ICD-10-CM | POA: Insufficient documentation

## 2011-07-25 DIAGNOSIS — I10 Essential (primary) hypertension: Secondary | ICD-10-CM

## 2011-07-25 LAB — BASIC METABOLIC PANEL
Calcium: 9.2 mg/dL (ref 8.4–10.5)
Creat: 0.77 mg/dL (ref 0.50–1.10)
Glucose, Bld: 84 mg/dL (ref 70–99)
Sodium: 141 mEq/L (ref 135–145)

## 2011-07-25 MED ORDER — HYDROCODONE-ACETAMINOPHEN 5-325 MG PO TABS: 1.0000 | ORAL_TABLET | Freq: Four times a day (QID) | ORAL | Status: AC | PRN

## 2011-07-25 NOTE — Assessment & Plan Note (Signed)
Pain of unknown etiology - likely secondary to lumbosacral strain. Will rule out fracture or bony metastasis - plain films of lumbar and sacral spine. Vicodin for short term therapy (7 days) - discussed risks of narcotics long term with patient and family members. Avoid NSAIDS for now - on Plavix and Aspirin s/p stent placement (last BMET was in 2010 - normal).  Will order BMET today. Offered PT referral, but patient declined - will defer to PCP. Follow up with PCP in 1-2 weeks to review results and follow up pain status.

## 2011-07-25 NOTE — Patient Instructions (Signed)
Take Vicodin for short term treatment - 1 tablet every 6 hrs as needed for pain x 7 days. Schedule a follow up appointment with PCP to review Xray results. Try to avoid strenuous activity for one week and apply warm compresses or ice to low back and sacrum.  Sciatica  Sciatica is a weakness and/or changes in sensation (tingling, jolts, hot and cold, numbness) along the path the sciatic nerve travels. Irritation or damage to lumbar nerve roots is often also referred to as lumbar radiculopathy.  Lumbar radiculopathy (Sciatica) is the most common form of this problem. Radiculopathy can occur in any of the nerves coming out of the spinal cord. The problems caused depend on which nerves are involved. The sciatic nerve is the large nerve supplying the branches of nerves going from the hip to the toes. It often causes a numbness or weakness in the skin and/or muscles that the sciatic nerve serves. It also may cause symptoms (problems) of pain, burning, tingling, or electric shock-like feelings in the path of this nerve. This usually comes from injury to the fibers that make up the sciatic nerve. Some of these symptoms are low back pain and/or unpleasant feelings in the following areas:  From the mid-buttock down the back of the leg to the back of the knee.   And/or the outside of the calf and top of the foot.   And/or behind the inner ankle to the sole of the foot.  CAUSES   Herniated or slipped disc. Discs are the little cushions between the bones in the back.   Pressure by the piriformis muscle in the buttock on the sciatic nerve (Piriformis Syndrome).   Misalignment of the bones in the lower back and buttocks (Sacroiliac Joint Derangement).   Narrowing of the spinal canal that puts pressure on or pinches the fibers that make up the sciatic nerve.   A slipped vertebra that is out of line with those above or beneath it.   Abnormality of the nervous system itself so that nerve fibers do not  transmit signals properly, especially to feet and calves (neuropathy).   Tumor (this is rare).  Your caregiver can usually determine the cause of your sciatica and begin the treatment most likely to help you. TREATMENT  Taking over-the-counter painkillers, physical therapy, rest, exercise, spinal manipulation, and injections of anesthetics and/or steroids may be used. Surgery, acupuncture, and Yoga can also be effective. Mind over matter techniques, mental imagery, and changing factors such as your bed, chair, desk height, posture, and activities are other treatments that may be helpful. You and your caregiver can help determine what is best for you. With proper diagnosis, the cause of most sciatica can be identified and removed. Communication and cooperation between your caregiver and you is essential. If you are not successful immediately, do not be discouraged. With time, a proper treatment can be found that will make you comfortable. HOME CARE INSTRUCTIONS   If the pain is coming from a problem in the back, applying ice to that area for 15 to 20 minutes, 3 to 4 times per day while awake, may be helpful. Put the ice in a plastic bag. Place a towel between the bag of ice and your skin.   You may exercise or perform your usual activities if these do not aggravate your pain, or as suggested by your caregiver.   Only take over-the-counter or prescription medicines for pain, discomfort, or fever as directed by your caregiver.   If your caregiver  has given you a follow-up appointment, it is very important to keep that appointment. Not keeping the appointment could result in a chronic or permanent injury, pain, and disability. If there is any problem keeping the appointment, you must call back to this facility for assistance.  SEEK IMMEDIATE MEDICAL CARE IF:   You experience loss of control of bowel or bladder.   You have increasing weakness in the trunk, buttocks, or legs.   There is numbness in  any areas from the hip down to the toes.   You have difficulty walking or keeping your balance.   You have any of the above, with fever or forceful vomiting.  Document Released: 06/21/2001 Document Revised: 03/09/2011 Document Reviewed: 02/08/2008 [facility] Patient Information 2012 [place], [place].

## 2011-07-25 NOTE — Progress Notes (Signed)
  Subjective:    Patient ID: Natalie [name], female    DOB: 1933-10-26, 78 y.o.   MRN: [id number]  HPI  Patient presents to same day clinic for low back pain that started 2 weeks ago.  Came on suddenly after patient was gardening.  Pain has been gradually worse ever since.  Located low back and right sacrum, pain radiates to right hip and right lower extremity.  She also endorses numbness in right LE which is a chronic issue.  Patient had back surgery in 2011 by Dr. Venetia Maxon for herniated disk, no complications until now.  Pain is worse at night when she lays down.  She has tried Tylenol arthritis which provided no relief, so she took expired Vicodin which improved pain significantly.    Denies any fever, chills, NS, N/V.  Denies any urinary or bowel incontinence.   Review of Systems  Per HPI    Objective:   Physical Exam  Constitutional: No distress.  Musculoskeletal:       Lumbar back: She exhibits bony tenderness. She exhibits normal range of motion, no tenderness, no swelling, no edema, no deformity, no pain and no spasm.       Back:       Straight leg raise: negative bilaterally          Assessment & Plan:

## 2011-08-05 ENCOUNTER — Telehealth: Payer: Self-pay | Admitting: Family Medicine

## 2011-08-05 NOTE — Telephone Encounter (Signed)
forwarded to pcp for imaging results and medication question.Natalie [name]

## 2011-08-05 NOTE — Telephone Encounter (Signed)
Patient is calling for her results and to let Dr. Louanne Belton know that she is still in quite a bit of pain.  The Hydrocodone is not working.

## 2011-08-11 NOTE — Telephone Encounter (Signed)
Tried to call patient and got unidentified voicemail.  Will try again later.

## 2011-08-12 NOTE — Telephone Encounter (Signed)
Has this been addressed, can it be closed?

## 2011-08-12 NOTE — Telephone Encounter (Signed)
I don't know how to do that.  It is ok for it to show up in my inbox as long as I know that another encounter hasn't been opened and that there is still something being done with it.

## 2011-08-12 NOTE — Telephone Encounter (Signed)
Can you just assign it to me so it is no longer in your inbox?  I have tried to get in touch and have been unsuccessful.  As long as I have the open encounter, I can remind myself to try again.

## 2011-08-15 NOTE — Telephone Encounter (Signed)
Pt still having some back pain but mainly at night.  Is able to sleep with occasional pain meds.  Offered PT.  Is not interested at this point.

## 2011-08-17 ENCOUNTER — Other Ambulatory Visit: Payer: Self-pay | Admitting: Cardiology

## 2011-08-19 ENCOUNTER — Ambulatory Visit: Payer: Medicare Other | Admitting: Physician Assistant

## 2011-08-19 ENCOUNTER — Ambulatory Visit (INDEPENDENT_AMBULATORY_CARE_PROVIDER_SITE_OTHER): Payer: Medicare Other | Admitting: Nurse Practitioner

## 2011-08-19 ENCOUNTER — Encounter: Payer: Self-pay | Admitting: Nurse Practitioner

## 2011-08-19 VITALS — BP 110/70 | HR 75 | Ht 67.0 in | Wt 146.0 lb

## 2011-08-19 DIAGNOSIS — R5383 Other fatigue: Secondary | ICD-10-CM

## 2011-08-19 DIAGNOSIS — R079 Chest pain, unspecified: Secondary | ICD-10-CM

## 2011-08-19 DIAGNOSIS — K219 Gastro-esophageal reflux disease without esophagitis: Secondary | ICD-10-CM

## 2011-08-19 DIAGNOSIS — I2510 Atherosclerotic heart disease of native coronary artery without angina pectoris: Secondary | ICD-10-CM

## 2011-08-19 DIAGNOSIS — R5381 Other malaise: Secondary | ICD-10-CM

## 2011-08-19 LAB — BASIC METABOLIC PANEL
BUN: 16 mg/dL (ref 6–23)
CO2: 31 mEq/L (ref 19–32)
Calcium: 9.5 mg/dL (ref 8.4–10.5)
Chloride: 100 mEq/L (ref 96–112)
Creatinine, Ser: 0.9 mg/dL (ref 0.4–1.2)
GFR: 80.35 mL/min (ref >60.00)
Glucose, Bld: 100 mg/dL — ABNORMAL HIGH (ref 70–99)
Potassium: 3.2 mEq/L — ABNORMAL LOW (ref 3.5–5.1)
Sodium: 139 mEq/L (ref 135–145)

## 2011-08-19 LAB — CBC WITH DIFFERENTIAL/PLATELET
Basophils Absolute: 0 10*3/uL (ref 0.0–0.1)
Basophils Relative: 0.2 % (ref 0.0–3.0)
Eosinophils Absolute: 0.1 10*3/uL (ref 0.0–0.7)
Eosinophils Relative: 1.2 % (ref 0.0–5.0)
HCT: 38.4 % (ref 36.0–46.0)
Hemoglobin: 12.8 g/dL (ref 12.0–15.0)
Lymphocytes Relative: 20.3 % (ref 12.0–46.0)
Lymphs Abs: 1.5 10*3/uL (ref 0.7–4.0)
MCHC: 33.5 g/dL (ref 30.0–36.0)
MCV: 90.8 fl (ref 78.0–100.0)
Monocytes Absolute: 0.5 10*3/uL (ref 0.1–1.0)
Monocytes Relative: 6.1 % (ref 3.0–12.0)
Neutro Abs: 5.4 10*3/uL (ref 1.4–7.7)
Neutrophils Relative %: 72.2 % (ref 43.0–77.0)
Platelets: 680 10*3/uL — ABNORMAL HIGH (ref 150.0–400.0)
RBC: 4.23 Mil/uL (ref 3.87–5.11)
RDW: 14.3 % (ref 11.5–14.6)
WBC: 7.4 10*3/uL (ref 4.5–10.5)

## 2011-08-19 LAB — TSH: TSH: 2.27 u[IU]/mL (ref 0.35–5.50)

## 2011-08-19 MED ORDER — PANTOPRAZOLE SODIUM 40 MG PO TBEC: 40.0000 mg | DELAYED_RELEASE_TABLET | Freq: Every day | ORAL | Status: DC

## 2011-08-19 MED ORDER — ISOSORBIDE MONONITRATE ER 30 MG PO TB24: 30.0000 mg | ORAL_TABLET | Freq: Every day | ORAL | Status: DC

## 2011-08-19 MED ORDER — NITROGLYCERIN 0.4 MG SL SUBL: 0.4000 mg | SUBLINGUAL_TABLET | SUBLINGUAL | Status: DC | PRN

## 2011-08-19 NOTE — Assessment & Plan Note (Signed)
Patient is presenting with a multitude of somatic complaints. Some sound more GI related but does have known CAD. We are adding Imdur 30 mg per day. We are adding Protonix 40 mg daily. I have refilled her NTG as well. Will check complete labs today and will arrange for early stress testing next week. She is to stop her "juicing" program. We will see her back at her regular time next week. Patient is agreeable to this plan and will call if any problems develop in the interim.

## 2011-08-19 NOTE — Patient Instructions (Signed)
We need to check some lab work today.  Stop "juicing"  We are going to put you on Imdur 30 mg daily. This may make your headaches a little worse. You may use some Tylenol.  We are going to put you on Protonix 40 mg daily. This is to shut down all the acid in your stomach.  We are going to set up a stress test for early next week.  Keep your appointment for next Friday.   I have also refilled your NTG. Use your NTG under your tongue for recurrent chest pain. May take one tablet every 5 minutes. If you are still having discomfort after 3 tablets in 15 minutes, call 911.  Call the [facility] office at [phone] if you have any questions, problems or concerns.

## 2011-08-19 NOTE — Progress Notes (Signed)
Natalie [name] Date of Birth: 1934/02/20 Medical Record #[id number]  History of Present Illness: Natalie [name] is seen today as a work in visit. She is seen for Dr. Daleen Squibb. She has a history of known CAD with remote PCI to the RCA back in 2007. Her last stress test was in 2008. Has not been seen here in the office since May of 2011. A follow up Myoview was ordered but I can't see where it was done. Other issues include back pain, HTN and hyperlipidemia.  She comes in today. She is here with family. She reports that she has not felt well over the past one week. She had had some pretty bad back pain. Went to see her PCP and was given Vicodin. She really did not want to take it. A friend suggested a "juicing" program with beets and ginger. It has helped her back pain. Now over the past week however, she feels more fatigued. Her chest feels a little heavy. She has not used NTG. She may be a bit more short of breath. Notes a raspy voice, bad taste in her mouth and a dry cough, especially at night. Not on PPI therapy. Stools were dark earlier in the week (? Due to beets). She is belching and burping a lot. Her feet are cold. She feels like some of her symptoms are similar to her prior chest pain syndrome.  Has had some mild headaches. No fever or chills. She is due to see Dr. Daleen Squibb in one week. She does remain on chronic Plavix.   Current Outpatient Prescriptions on File Prior to Visit  Medication Sig Dispense Refill  . aspirin EC 325 MG EC tablet Take 325 mg by mouth once daily.        . hydrochlorothiazide (HYDRODIURIL) 12.5 MG tablet TAKE 1 TABLET EVERY MORNING  30 tablet  0  . metoprolol (LOPRESSOR) 50 MG tablet TAKE 1 TABLET TWICE A DAY  60 tablet  0  . PLAVIX 75 MG tablet TAKE 1 TABLET BY MOUTH EVERY DAY  30 tablet  11  . DISCONTD: nitroGLYCERIN (NITROSTAT) 0.4 MG SL tablet Place 0.4 mg under the tongue every 5 (five) minutes as needed. For up to 3 doses          Allergies  Allergen  Reactions  . Oxycontin   . Penicillins     REACTION: unspecified    Past Medical History  Diagnosis Date  . HTN (hypertension)   . Hyperlipidemia   . 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 CAD (coronary artery disease)     prior DES Jan 2007 with low risk myoview in 2008  . Back pain     with radiculopathy  . GERD (gastroesophageal reflux disease)     Past Surgical History  Procedure Date  . Coronary stent placement Jan 2007    DES to RCA  . Microdiscectomy     L4-L5    History  Smoking status  . Never Smoker   Smokeless tobacco  . Not on file    History  Alcohol Use No    History reviewed. No pertinent family history.  Review of Systems: The review of systems is per the HPI.  All other systems were reviewed and are negative.  Physical Exam: BP 110/70  Pulse 75  Ht 5\' 7"  (1.702 m)  Wt 146 lb (66.225 kg)  BMI 22.87 kg/m2 Patient is very pleasant and in  no acute distress. Skin is warm and dry. Color is normal and maybe a little pale.   HEENT is unremarkable. Normocephalic/atraumatic. PERRL. Sclera are nonicteric. Neck is supple. No masses. No JVD. Lungs are clear. Cardiac exam shows a regular rate and rhythm. Abdomen is soft. Extremities are without edema. Gait and ROM are intact. No gross neurologic deficits noted.   Laboratory Data:  EKG today shows sinus rhythm with LVH with repolarization. PR is short.    Assessment / Plan:

## 2011-08-23 ENCOUNTER — Ambulatory Visit (HOSPITAL_COMMUNITY): Payer: Medicare Other | Attending: Cardiology | Admitting: Radiology

## 2011-08-23 DIAGNOSIS — R079 Chest pain, unspecified: Secondary | ICD-10-CM | POA: Insufficient documentation

## 2011-08-23 DIAGNOSIS — E785 Hyperlipidemia, unspecified: Secondary | ICD-10-CM | POA: Insufficient documentation

## 2011-08-23 DIAGNOSIS — R5381 Other malaise: Secondary | ICD-10-CM | POA: Insufficient documentation

## 2011-08-23 DIAGNOSIS — Z87891 Personal history of nicotine dependence: Secondary | ICD-10-CM | POA: Insufficient documentation

## 2011-08-23 DIAGNOSIS — R5383 Other fatigue: Secondary | ICD-10-CM

## 2011-08-23 DIAGNOSIS — K219 Gastro-esophageal reflux disease without esophagitis: Secondary | ICD-10-CM

## 2011-08-23 DIAGNOSIS — I252 Old myocardial infarction: Secondary | ICD-10-CM | POA: Insufficient documentation

## 2011-08-23 DIAGNOSIS — I2510 Atherosclerotic heart disease of native coronary artery without angina pectoris: Secondary | ICD-10-CM

## 2011-08-23 DIAGNOSIS — I10 Essential (primary) hypertension: Secondary | ICD-10-CM | POA: Insufficient documentation

## 2011-08-23 MED ORDER — REGADENOSON 0.4 MG/5ML IV SOLN
0.4000 mg | Freq: Once | INTRAVENOUS | Status: AC
  Administered 2011-08-23: 0.4 mg via INTRAVENOUS

## 2011-08-23 MED ORDER — TECHNETIUM TC 99M TETROFOSMIN IV KIT
10.0000 | PACK | Freq: Once | INTRAVENOUS | Status: AC | PRN
  Administered 2011-08-23: 10 via INTRAVENOUS

## 2011-08-23 MED ORDER — TECHNETIUM TC 99M TETROFOSMIN IV KIT
30.0000 | PACK | Freq: Once | INTRAVENOUS | Status: AC | PRN
  Administered 2011-08-23: 30 via INTRAVENOUS

## 2011-08-23 NOTE — Progress Notes (Signed)
[facility] SITE 3 NUCLEAR MED [address] [place] [address] [phone]  Cardiology Nuclear Med Study  Natalie [name] is a 78 y.o. female [id number] 1933-07-24   Nuclear Med Background Indication for Stress Test:  Evaluation for Ischemia and Stent Patency History: 2000 History of Chemo: Colon Ca, MI: NSTEMI, 2007 Stents: RCA, 7/08 MPS: NL EF: 67%, (-) ischemia Cardiac Risk Factors: History of Smoking, Hypertension and Lipids  Symptoms:  Chest Pain and Fatigue   Nuclear Pre-Procedure Caffeine/Decaff Intake:  None NPO After: 8:00am   Lungs:  clear IV 0.9% NS with Angio Cath:  22g  IV Site: R Antecubital x 1, tolerated well IV Started by:  Irean Hong, RN  Chest Size (in):  38 Cup Size: B  Height: 5\' 7"  (1.702 m)  Weight:  146 lb (66.225 kg)  BMI:  Body mass index is 22.87 kg/(m^2). Tech Comments:  Metoprolol held this am    Nuclear Med Study 1 or 2 day study: 1 day  Stress Test Type:  Lexiscan  Reading MD: Willa Rough, MD  Order Authorizing Provider:  Valera Castle, MD  Resting Radionuclide: Technetium 40m Tetrofosmin  Resting Radionuclide Dose: 11.0 mCi   Stress Radionuclide:  Technetium 73m Tetrofosmin  Stress Radionuclide Dose: 33.0 mCi           Stress Protocol Rest HR: 76 Stress HR: 115  Rest BP: 159/84 Stress BP: 165/71  Exercise Time (min): n/a METS: n/a   Predicted Max HR: 145 bpm % Max HR: 79.31 bpm Rate Pressure Product: 24401   Dose of Adenosine (mg):  n/a Dose of Lexiscan: 0.4 mg  Dose of Atropine (mg): n/a Dose of Dobutamine: n/a mcg/kg/min (at max HR)  Stress Test Technologist: Milana Na, EMT-P  Nuclear Technologist:  Domenic Polite, CNMT     Rest Procedure:  Myocardial perfusion imaging was performed at rest 45 minutes following the intravenous administration of Technetium 48m Tetrofosmin. Rest ECG: NSR-LVH  Stress Procedure:  The patient received IV Lexiscan 0.4 mg over 15-seconds.  Technetium 59m  Tetrofosmin injected at 30-seconds.  There were no significant changes, throat tightness, pressure in her head, and abdominal cramping with Lexiscan.  Quantitative spect images were obtained after a 45 minute delay. Stress ECG: No significant change from baseline ECG  QPS Raw Data Images:  Patient motion noted; appropriate software correction applied. Stress Images:  Normal homogeneous uptake in all areas of the myocardium. Rest Images:  Normal homogeneous uptake in all areas of the myocardium. Subtraction (SDS):  No evidence of ischemia. Transient Ischemic Dilatation (Normal <1.22):  1.12 Lung/Heart Ratio (Normal <0.45):  0.27  Quantitative Gated Spect Images QGS EDV:  43 ml QGS ESV:  9 ml QGS cine images:  Normal Wall Motion QGS EF: 78%  Impression Exercise Capacity:  Lexiscan with no exercise. BP Response:  Normal blood pressure response. Clinical Symptoms:  The patient felt throat tightness ECG Impression:  No significant ST segment change suggestive of ischemia. Comparison with Prior Nuclear Study: No images to compare  Overall Impression:  Normal stress nuclear study. The left ventricle is small. There are no significant abnormalities.  Willa Rough, MD     .

## 2011-08-26 ENCOUNTER — Encounter: Payer: Self-pay | Admitting: Cardiology

## 2011-08-26 ENCOUNTER — Ambulatory Visit (INDEPENDENT_AMBULATORY_CARE_PROVIDER_SITE_OTHER): Payer: Medicare Other | Admitting: Cardiology

## 2011-08-26 VITALS — BP 134/71 | HR 82 | Ht 67.0 in | Wt 149.0 lb

## 2011-08-26 DIAGNOSIS — I10 Essential (primary) hypertension: Secondary | ICD-10-CM

## 2011-08-26 DIAGNOSIS — I2510 Atherosclerotic heart disease of native coronary artery without angina pectoris: Secondary | ICD-10-CM

## 2011-08-26 DIAGNOSIS — E785 Hyperlipidemia, unspecified: Secondary | ICD-10-CM

## 2011-08-26 NOTE — Assessment & Plan Note (Signed)
Stable. Patient  results reviewed with her and her husband. She would like to decrease the dose of her aspirin to 81 mg. I improved. She will remain on Plavix.  Followup in one year

## 2011-08-26 NOTE — Patient Instructions (Signed)
Your physician wants you to follow-up in: 12 months.  You will receive a reminder letter in the mail two months in advance. If you don't receive a letter, please call our office to schedule the follow-up appointment.   Foods Rich in Potassium The body needs potassium to:  Control blood pressure.   Keep the muscles healthy.   Keep the nervous system healthy.  Most foods contain potassium. Eating a variety of foods in the right amounts will help control the level of potassium in your body.  Food / Potassium (mg)  Apricots, dried,  cup / 378 mg   Apricots, raw, 1 cup halves / 401 mg   Avocado,  / 487 mg   Banana, 1 large / 487 mg   Beef, lean, round, 3 oz / 202 mg   Cantaloupe, 1 cup cubes / 427 mg   Dates, medjool, 5 whole / 835 mg   Ham, cured, 3 oz / 212 mg   Lentils, dried,  cup / 458 mg   Lima beans, frozen,  cup / 258 mg   Orange, 1 large / 333 mg   Orange juice, 1 cup / 443 mg   Peaches, dried,  cup / 398 mg   Peas, split, cooked,  cup / 355 mg   Potato, boiled, 1 medium / 515 mg   Prunes, dried, uncooked,  cup / 318 mg   Raisins,  cup / 309 mg   Salmon, pink, raw, 3 oz / 275 mg   Sardines, canned , 3 oz / 338 mg   Tomato, raw, 1 medium / 292 mg   Tomato juice, 6 oz / 417 mg   [place], 3 oz / 349 mg  Other Foods High in Potassium (greater than 250 mg):  Bran cereals and other bran products.   Milk (skim, 1%, 2%, whole).   Buttermilk.   Yogurt.   Nuts.   Dried fruits.   Cherries.   Sweet potatoes.   Oranges.   Baked Beans.   Broccoli.   Spinach.   Peanut butter.   Tofu.  Foods Lower in Potassium (less than 250 mg):  Pasta.   Rice.   Cottage cheese.   Cheddar cheese.   Apples.   Mango.   Grapes.   Grapefruit.   Pineapple.   Raspberries.   Strawberries.   Watermelon.   Green Beans.   Cabbage.   Carrots.   Cauliflower.   Celery.   Corn.    Mushrooms.   Onions.   Squash.   Eggs.    The list below tells you how big or small some common portion sizes are:  1 oz.........4 stacked dice.   3 oz........Marland KitchenDeck of cards.   1 tsp.......Marland KitchenTip of little finger.   1 tbs......Marland KitchenMarland KitchenThumb.   2 tbs.......Marland KitchenGolf ball.    cup......Marland KitchenHalf of a fist.   1 cup.......Marland KitchenA fist.  Document Released: 12/14/2007 Document Revised: 03/09/2011 Document Reviewed: 11/10/2008 [facility] Patient Information 2012 [place], [place].

## 2011-08-26 NOTE — Progress Notes (Signed)
HPI. [name] in today for followup of her chest discomfort and history of a right coronary artery stent. She had a stress Myoview which demonstrated an ejection fraction of 78% with no ischemia.  Her potassium was low at 3.2. Her platelet count was elevated but looking back it's always been in the 4-500 range. She is not anemic and her white blood cell count is normal.  We advised her on potassium rich diet. She is following this.  Past Medical History  Diagnosis Date  . HTN (hypertension)   . Hyperlipidemia   . 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 CAD (coronary artery disease)     prior DES Jan 2007 with low risk myoview in 2008  . Back pain     with radiculopathy  . GERD (gastroesophageal reflux disease)     Current Outpatient Prescriptions  Medication Sig Dispense Refill  . aspirin EC 325 MG EC tablet Take 325 mg by mouth once daily.        . hydrochlorothiazide (HYDRODIURIL) 12.5 MG tablet TAKE 1 TABLET EVERY MORNING  30 tablet  0  . isosorbide mononitrate (IMDUR) 30 MG 24 hr tablet Take 1 tablet (30 mg total) by mouth daily.  30 tablet  11  . metoprolol (LOPRESSOR) 50 MG tablet TAKE 1 TABLET TWICE A DAY  60 tablet  0  . nitroGLYCERIN (NITROSTAT) 0.4 MG SL tablet Place 1 tablet (0.4 mg total) under the tongue every 5 (five) minutes as needed. For up to 3 doses  25 tablet  6  . pantoprazole (PROTONIX) 40 MG tablet Take 1 tablet (40 mg total) by mouth daily.  30 tablet  6  . PLAVIX 75 MG tablet TAKE 1 TABLET BY MOUTH EVERY DAY  30 tablet  11    Allergies  Allergen Reactions  . Oxycontin   . Penicillins     REACTION: unspecified    No family history on file.  History   Social History  . Marital Status: Married    Spouse Name: N/A    Number of Children: N/A  . Years of Education: N/A   Occupational History  . Not on file.   Social History Main Topics  . Smoking status: Former Smoker -- 1.0 packs/day  for 15 years    Types: Cigarettes    Quit date: 08/25/1996  . Smokeless tobacco: Never Used  . Alcohol Use: No  . Drug Use: No  . Sexually Active: Not on file   Other Topics Concern  . Not on file   Social History Narrative  . No narrative on file    ROS ALL NEGATIVE EXCEPT THOSE NOTED IN HPI  PE  General Appearance: well developed, well nourished in no acute distress HEENT: symmetrical face, PERRLA,   Neck: no JVD, thyromegaly, or adenopathy, trachea midline Chest: symmetric without deformity Cardiac: PMI non-displaced, RRR, normal S1, S2, no gallop or murmur Lung: clear to ausculation and percussion Vascular: all pulses full without bruits  Abdominal: nondistended, nontender, good bowel sounds, no HSM, no bruits Extremities: no cyanosis, clubbing or edema, no sign of DVT, no varicosities  Skin: normal color, no rashes Neuro: alert and oriented x 3, non-focal Pysch: normal affect  EKG Not repeated BMET    Component Value Date/Time   NA 139 08/19/2011 1129   K 3.2* 08/19/2011 1129   CL 100 08/19/2011 1129   CO2 31 08/19/2011 1129   GLUCOSE 100*  08/19/2011 1129   BUN 16 08/19/2011 1129   CREATININE 0.9 08/19/2011 1129   CREATININE 0.77 07/25/2011 1120   CALCIUM 9.5 08/19/2011 1129   GFRNONAA >60 05/26/2009 1238   GFRAA  Value: >60        The eGFR has been calculated using the MDRD equation. This calculation has not been validated in all clinical situations. eGFR's persistently <60 mL/min signify possible Chronic Kidney Disease. 05/26/2009 1238    Lipid Panel     Component Value Date/Time   CHOL 184 09/22/2008 0949   TRIG 141 09/22/2008 0949   HDL 49.4 09/22/2008 0949   CHOLHDL 3.7 CALC 09/22/2008 0949   VLDL 28 09/22/2008 0949   LDLCALC 106* 09/22/2008 0949    CBC    Component Value Date/Time   WBC 7.4 08/19/2011 1129   RBC 4.23 08/19/2011 1129   HGB 12.8 08/19/2011 1129   HCT 38.4 08/19/2011 1129   PLT 680.0* 08/19/2011 1129   MCV 90.8 08/19/2011 1129   MCHC 33.5 08/19/2011 1129    RDW 14.3 08/19/2011 1129   LYMPHSABS 1.5 08/19/2011 1129   MONOABS 0.5 08/19/2011 1129   EOSABS 0.1 08/19/2011 1129   BASOSABS 0.0 08/19/2011 1129

## 2011-09-16 ENCOUNTER — Other Ambulatory Visit: Payer: Self-pay

## 2011-09-16 ENCOUNTER — Other Ambulatory Visit: Payer: Self-pay | Admitting: Cardiology

## 2011-09-16 MED ORDER — METOPROLOL TARTRATE 50 MG PO TABS: 50.0000 mg | ORAL_TABLET | Freq: Every day | ORAL | Status: DC

## 2011-09-16 MED ORDER — HYDROCHLOROTHIAZIDE 12.5 MG PO TABS: 12.5000 mg | ORAL_TABLET | Freq: Every day | ORAL | Status: DC

## 2011-09-16 NOTE — Telephone Encounter (Signed)
..   Requested Prescriptions   Pending Prescriptions Disp Refills  . hydrochlorothiazide (HYDRODIURIL) 12.5 MG tablet 90 tablet 3    Sig: Take 1 tablet (12.5 mg total) by mouth daily.  . metoprolol (LOPRESSOR) 50 MG tablet 90 tablet 3    Sig: Take 1 tablet (50 mg total) by mouth daily.

## 2011-09-16 NOTE — Telephone Encounter (Signed)
..   Requested Prescriptions   Signed Prescriptions Disp Refills  . metoprolol (LOPRESSOR) 50 MG tablet 90 tablet 3    Sig: Take 1 tablet (50 mg total) by mouth daily. Take 1 Tablet twice a day    Authorizing Provider: WALL, THOMAS C    Ordering User: Brittanyann Wittner M  [facility]  The first rx had the wrong direction on it.

## 2011-09-21 ENCOUNTER — Other Ambulatory Visit: Payer: Self-pay | Admitting: Cardiology

## 2011-09-21 NOTE — Telephone Encounter (Signed)
New Msg: pt calling needing refill of metroprol and HCTZ called into [facility]. Pt stated that [facility] gave pt a form that she needed to be completed by MD and then sent to [facility]. Pt wants to know if we need her to send form to [place]. Please return pt call to discuss further.

## 2011-12-05 ENCOUNTER — Other Ambulatory Visit: Payer: Self-pay | Admitting: Cardiology

## 2012-02-01 ENCOUNTER — Encounter: Payer: Self-pay | Admitting: Family Medicine

## 2012-02-01 ENCOUNTER — Ambulatory Visit (INDEPENDENT_AMBULATORY_CARE_PROVIDER_SITE_OTHER): Payer: Medicare Other | Admitting: Family Medicine

## 2012-02-01 VITALS — BP 143/86 | HR 75 | Temp 97.7°F | Ht 67.0 in | Wt 147.5 lb

## 2012-02-01 DIAGNOSIS — IMO0002 Reserved for concepts with insufficient information to code with codable children: Secondary | ICD-10-CM

## 2012-02-01 MED ORDER — GABAPENTIN 100 MG PO CAPS: 100.0000 mg | ORAL_CAPSULE | Freq: Three times a day (TID) | ORAL | Status: DC

## 2012-02-01 MED ORDER — HYDROCODONE-ACETAMINOPHEN 5-500 MG PO TABS: 1.0000 | ORAL_TABLET | Freq: Four times a day (QID) | ORAL | Status: AC | PRN

## 2012-02-07 NOTE — Assessment & Plan Note (Signed)
Recent flair, no red flag symptoms to warrant advanced imaging.

## 2012-02-07 NOTE — Progress Notes (Signed)
  Subjective:    Patient ID: Natalie [name], female    DOB: 1933/09/06, 78 y.o.   MRN: [id number]  HPI Patient with flair of Rt. Leg pain.  States it is typical of the sciatic that she had earlier.  No obvious trauma or injury.  No leg weakness, only pain.  Had degenerative disc disease on 2010 lumbar MRI.  Duration is two weeks.      Review of SystemsDenies left leg pain, bowel or bladder changes.     Objective:   Physical Exam Decreased Knee reflexes bilaterally Decreased ankle reflex on Rt.       Assessment & Plan:

## 2012-02-07 NOTE — Patient Instructions (Addendum)
Follow up in 4-6 weeks if symptoms persist.  Sooner if you develop new symptoms of weakness or bowel or bladder changes.

## 2012-03-05 ENCOUNTER — Telehealth: Payer: Self-pay | Admitting: Cardiology

## 2012-03-05 MED ORDER — METOPROLOL TARTRATE 50 MG PO TABS: 50.0000 mg | ORAL_TABLET | Freq: Two times a day (BID) | ORAL | Status: DC

## 2012-03-05 NOTE — Telephone Encounter (Signed)
New Problem:    Patient called in wanting her next refill of her metoprolol (LOPRESSOR) 50 MG tablet filled for 90 day refills [facility] phone#- [phone].  Please call back if you have any questions.

## 2012-04-06 ENCOUNTER — Telehealth: Payer: Self-pay | Admitting: *Deleted

## 2012-04-06 NOTE — Telephone Encounter (Signed)
Pt has appointment scheduled for 04/11/12. I called pt and she told me of some increasing shortness of breath when climbing steps. She has also experienced some intermittent chest pressure. She thought this was her acid reflux and it was relieved with her antacid. She did not have to take any NTG. "I just get a little anxious about my breathing sometime and I wanted Dr. Daleen Squibb to check it out".  States her weight has fluctuated up and down.  I will forward to Dr. Daleen Squibb. Mylo Red RN

## 2012-04-11 ENCOUNTER — Ambulatory Visit (INDEPENDENT_AMBULATORY_CARE_PROVIDER_SITE_OTHER): Payer: Medicare Other | Admitting: Cardiology

## 2012-04-11 ENCOUNTER — Encounter: Payer: Self-pay | Admitting: Cardiology

## 2012-04-11 VITALS — BP 130/80 | HR 73 | Ht 66.5 in | Wt 146.9 lb

## 2012-04-11 DIAGNOSIS — I2510 Atherosclerotic heart disease of native coronary artery without angina pectoris: Secondary | ICD-10-CM

## 2012-04-11 MED ORDER — HYDROCHLOROTHIAZIDE 12.5 MG PO TABS: 12.5000 mg | ORAL_TABLET | Freq: Every day | ORAL | Status: DC

## 2012-04-11 MED ORDER — ASPIRIN EC 81 MG PO TBEC: 81.0000 mg | DELAYED_RELEASE_TABLET | Freq: Every day | ORAL | Status: DC

## 2012-04-11 NOTE — Assessment & Plan Note (Addendum)
Stable. Decrease aspirin 81 mg a day to decrease bruising. Return the office in one year. Followup primary care for blood work. Stress Myoview from February of 2013 reviewed. It was normal. Patient reassured.

## 2012-04-11 NOTE — Progress Notes (Signed)
HPI Natalie [name] comes in today for evaluation and management of her coronary artery disease. She has significant fatigue with walking but this is been a chronic issue. She denies any angina. She has not had to use nitroglycerin.  She's been hypokalemic in the past on HCTZ. She is eating a lot of fruits  She has leg cramps at night which improve with mustard. She continues to take 325 mg of aspirin still complains of bruising. I've asked her last visit to switch to 81 mg.  Past Medical History  Diagnosis Date  . HTN (hypertension)   . Hyperlipidemia   . 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 CAD (coronary artery disease)     prior DES Jan 2007 with low risk myoview in 2008  . Back pain     with radiculopathy  . GERD (gastroesophageal reflux disease)     Current Outpatient Prescriptions  Medication Sig Dispense Refill  . aspirin EC 325 MG EC tablet Take 325 mg by mouth once daily.        . clopidogrel (PLAVIX) 75 MG tablet TAKE 1 TABLET BY MOUTH EVERY DAY  30 tablet  11  . hydrochlorothiazide (HYDRODIURIL) 12.5 MG tablet Take 1 tablet (12.5 mg total) by mouth daily.  90 tablet  3  . metoprolol (LOPRESSOR) 50 MG tablet Take 1 tablet (50 mg total) by mouth 2 (two) times daily.  180 tablet  1  . nitroGLYCERIN (NITROSTAT) 0.4 MG SL tablet Place 1 tablet (0.4 mg total) under the tongue every 5 (five) minutes as needed. For up to 3 doses  25 tablet  6    Allergies  Allergen Reactions  . Oxycodone Hcl Er   . Penicillins     REACTION: unspecified    No family history on file.  History   Social History  . Marital Status: Married    Spouse Name: N/A    Number of Children: N/A  . Years of Education: N/A   Occupational History  . Not on file.   Social History Main Topics  . Smoking status: Former Smoker -- 1.0 packs/day for 15 years    Types: Cigarettes    Quit date: 08/25/1996  . Smokeless tobacco: Never Used  . Alcohol  Use: No  . Drug Use: No  . Sexually Active: Not on file   Other Topics Concern  . Not on file   Social History Narrative  . No narrative on file    ROS ALL NEGATIVE EXCEPT THOSE NOTED IN HPI  PE  General Appearance: well developed, well nourished in no acute distress HEENT: symmetrical face, PERRLA, good dentition  Neck: no JVD, thyromegaly, or adenopathy, trachea midline Chest: symmetric without deformity Cardiac: PMI non-displaced, RRR, normal S1, S2, no gallop or murmur Lung: clear to ausculation and percussion Vascular: all pulses full without bruits  Abdominal: nondistended, nontender, good bowel sounds, no HSM, no bruits Extremities: no cyanosis, clubbing or edema, no sign of DVT, no varicosities  Skin: normal color, no rashes Neuro: alert and oriented x 3, non-focal Pysch: normal affect  EKG Normal sinus rhythm with profound ST segment changes inferior laterally. No change since last EKG. BMET    Component Value Date/Time   NA 139 08/19/2011 1129   K 3.2* 08/19/2011 1129   CL 100 08/19/2011 1129   CO2 31 08/19/2011 1129   GLUCOSE 100* 08/19/2011 1129   BUN 16 08/19/2011 1129   CREATININE  0.9 08/19/2011 1129   CREATININE 0.77 07/25/2011 1120   CALCIUM 9.5 08/19/2011 1129   GFRNONAA >60 05/26/2009 1238   GFRAA  Value: >60        The eGFR has been calculated using the MDRD equation. This calculation has not been validated in all clinical situations. eGFR's persistently <60 mL/min signify possible Chronic Kidney Disease. 05/26/2009 1238    Lipid Panel     Component Value Date/Time   CHOL 184 09/22/2008 0949   TRIG 141 09/22/2008 0949   HDL 49.4 09/22/2008 0949   CHOLHDL 3.7 CALC 09/22/2008 0949   VLDL 28 09/22/2008 0949   LDLCALC 106* 09/22/2008 0949    CBC    Component Value Date/Time   WBC 7.4 08/19/2011 1129   RBC 4.23 08/19/2011 1129   HGB 12.8 08/19/2011 1129   HCT 38.4 08/19/2011 1129   PLT 680.0* 08/19/2011 1129   MCV 90.8 08/19/2011 1129   MCHC 33.5 08/19/2011 1129   RDW 14.3  08/19/2011 1129   LYMPHSABS 1.5 08/19/2011 1129   MONOABS 0.5 08/19/2011 1129   EOSABS 0.1 08/19/2011 1129   BASOSABS 0.0 08/19/2011 1129

## 2012-04-11 NOTE — Patient Instructions (Addendum)
Decrease your aspirin to 81 mg a day   Your physician wants you to follow-up in: 1 year with Dr. Daleen Squibb. You will receive a reminder letter in the mail two months in advance. If you don't receive a letter, please call our office to schedule the follow-up appointment.

## 2012-05-16 ENCOUNTER — Emergency Department (HOSPITAL_COMMUNITY)
Admission: EM | Admit: 2012-05-16 | Discharge: 2012-05-16 | Disposition: A | Discharge status: Home / Self Care | Payer: Medicare Other | Attending: Emergency Medicine | Admitting: Emergency Medicine

## 2012-05-16 ENCOUNTER — Telehealth: Payer: Self-pay | Admitting: *Deleted

## 2012-05-16 ENCOUNTER — Emergency Department (HOSPITAL_COMMUNITY): Payer: Medicare Other

## 2012-05-16 ENCOUNTER — Encounter (HOSPITAL_COMMUNITY): Payer: Self-pay | Admitting: Emergency Medicine

## 2012-05-16 DIAGNOSIS — M79606 Pain in leg, unspecified: Secondary | ICD-10-CM

## 2012-05-16 DIAGNOSIS — I8390 Asymptomatic varicose veins of unspecified lower extremity: Secondary | ICD-10-CM | POA: Insufficient documentation

## 2012-05-16 DIAGNOSIS — Z87891 Personal history of nicotine dependence: Secondary | ICD-10-CM | POA: Insufficient documentation

## 2012-05-16 DIAGNOSIS — I2510 Atherosclerotic heart disease of native coronary artery without angina pectoris: Secondary | ICD-10-CM | POA: Insufficient documentation

## 2012-05-16 DIAGNOSIS — Z79899 Other long term (current) drug therapy: Secondary | ICD-10-CM | POA: Insufficient documentation

## 2012-05-16 DIAGNOSIS — Z85038 Personal history of other malignant neoplasm of large intestine: Secondary | ICD-10-CM | POA: Insufficient documentation

## 2012-05-16 DIAGNOSIS — Z7901 Long term (current) use of anticoagulants: Secondary | ICD-10-CM | POA: Insufficient documentation

## 2012-05-16 DIAGNOSIS — M7989 Other specified soft tissue disorders: Secondary | ICD-10-CM

## 2012-05-16 DIAGNOSIS — I10 Essential (primary) hypertension: Secondary | ICD-10-CM | POA: Insufficient documentation

## 2012-05-16 DIAGNOSIS — Z8719 Personal history of other diseases of the digestive system: Secondary | ICD-10-CM | POA: Insufficient documentation

## 2012-05-16 DIAGNOSIS — E785 Hyperlipidemia, unspecified: Secondary | ICD-10-CM | POA: Insufficient documentation

## 2012-05-16 DIAGNOSIS — M79609 Pain in unspecified limb: Secondary | ICD-10-CM

## 2012-05-16 DIAGNOSIS — Z7982 Long term (current) use of aspirin: Secondary | ICD-10-CM | POA: Insufficient documentation

## 2012-05-16 HISTORY — DX: Preglaucoma, unspecified, unspecified eye: H40.009

## 2012-05-16 LAB — CBC
HCT: 38.3 % (ref 36.0–46.0)
Hemoglobin: 12.7 g/dL (ref 12.0–15.0)
MCH: 29.5 pg (ref 26.0–34.0)
MCHC: 33.2 g/dL (ref 30.0–36.0)
MCV: 88.9 fL (ref 78.0–100.0)
Platelets: 616 10*3/uL — ABNORMAL HIGH (ref 150–400)
RBC: 4.31 MIL/uL (ref 3.87–5.11)
RDW: 14 % (ref 11.5–15.5)
WBC: 7.4 10*3/uL (ref 4.0–10.5)

## 2012-05-16 LAB — URINALYSIS, ROUTINE W REFLEX MICROSCOPIC
Bilirubin Urine: NEGATIVE
Glucose, UA: NEGATIVE mg/dL
Hgb urine dipstick: NEGATIVE
Ketones, ur: NEGATIVE mg/dL
Nitrite: POSITIVE — AB
Protein, ur: NEGATIVE mg/dL
Specific Gravity, Urine: 1.018 (ref 1.005–1.030)
Urobilinogen, UA: 0.2 mg/dL (ref 0.0–1.0)
pH: 5 (ref 5.0–8.0)

## 2012-05-16 LAB — BASIC METABOLIC PANEL
BUN: 10 mg/dL (ref 6–23)
CO2: 30 mEq/L (ref 19–32)
Calcium: 9.6 mg/dL (ref 8.4–10.5)
Chloride: 103 mEq/L (ref 96–112)
Creatinine, Ser: 0.68 mg/dL (ref 0.50–1.10)
GFR calc Af Amer: >90 mL/min (ref >90)
GFR calc non Af Amer: 83 mL/min — ABNORMAL LOW (ref >90)
Glucose, Bld: 92 mg/dL (ref 70–99)
Potassium: 3.6 mEq/L (ref 3.5–5.1)
Sodium: 140 mEq/L (ref 135–145)

## 2012-05-16 LAB — URINE MICROSCOPIC-ADD ON

## 2012-05-16 MED ORDER — ACETAMINOPHEN 325 MG PO TABS
975.0000 mg | ORAL_TABLET | Freq: Once | ORAL | Status: AC
  Administered 2012-05-16: 975 mg via ORAL
  Filled 2012-05-16: qty 3

## 2012-05-16 MED ORDER — ACETAMINOPHEN 500 MG PO TABS: 1000.0000 mg | ORAL_TABLET | Freq: Once | ORAL | Status: DC

## 2012-05-16 NOTE — ED Provider Notes (Signed)
Natalie [name] is a 78 y.o. female placed in the CDU awaiting results of the lower venous duplex.   Patient with pain in the right lower leg, palpable not and bruising noted it yesterday.  Patient also complains of the right leg being numb but states this is chronic since her surgery.  States she called her primary care physician in an effort to be evaluated and he recommended that she be seen here in the emergency department.   On exam: hemodynamically stable, NAD, heart w/ RRR, lungs CTAB, Chest & abd non-tender, no peripheral edema. Neuro exam: Speech is clear and goal oriented, follows commands; Major Cranial nerves without deficit, no facial droop; Normal strength in upper and lower extremities bilaterally including dorsiflexion and plantar flexion, strong and equal grip strength; Sensation normal to light and sharp touch except on the lateral portion of the right leg per patient reports decreased sensation at baseline; Moves extremities without ataxia, coordination intact; Normal finger to nose and rapid alternating movements; Neg romberg, no pronator drift; Normal gait and balance.   Patient initially hypertensive with blood pressure has decreased spontaneously.  Lower venous duplex scan negative for DVT. Swelling none are likely secondary to ruptured varicose vein, though the patient denies any.  I discussed these findings with the patient and my recommendation for followup with her primary care physician for persistent symptoms and for monitoring. I have also discussed reasons to return immediately to the ER.  Patient expresses understanding and agrees with plan.  1. Medications: Usual home medications 2. Treatment: Rest, compress, elevate the extremity, use heat to increase blood flow. 3. Follow Up: With primary care physician this week.    Natalie Client Gennie Eisinger, PA-C 05/16/12 1932

## 2012-05-16 NOTE — Telephone Encounter (Signed)
Patient calls stating she has numbness and tingling  right side of body. Has been going on for a few days but now has started to worry her.  Feels  this in leg, toes, arms, face . Advised patient to go to ED now however she states" I am not going to ED because I don't have that kind of money."  RN consulted with Dr. Leveda Anna and he advises if patient will not go to ED have her come in today to this office at 1:30. ( we have no available appointment this AM.) Advised patient of this and again encouraged her to go to ED . She said she is worried so she will go on now. She has someone to take now.

## 2012-05-16 NOTE — ED Notes (Signed)
Numbness for a while ~ since 2011 in left leg   Has gotten worse and rt side got last night arm shoulder , and fingers are stiff. Has been able to use arm and hand .

## 2012-05-16 NOTE — ED Notes (Signed)
The patient is AOx4 and comfortable with her discharge instructions. 

## 2012-05-16 NOTE — ED Notes (Signed)
PT STATES SHE STILL FEELS SOME NUMBNESS IN HER RIGHT ARM AND SIDE. STATES SHE THINKS THE NUMBNESS IN HER RIGHT LEG IS "OLD NERVE DAMAGE FROM BACK SURGERY".  PT HUSBAND IS AT BEDSIDE AND HE HAS EXPRESSED THAT HE IS VERY TIRED OF BEING HERE AT THE HOSPITAL.

## 2012-05-16 NOTE — ED Notes (Signed)
PT HAS ARRIVED IN CDU FROM CT 

## 2012-05-16 NOTE — Progress Notes (Signed)
VASCULAR LAB PRELIMINARY  PRELIMINARY  PRELIMINARY  PRELIMINARY  Right lower extremity venous duplex completed.    Preliminary report:  Right:  No evidence of DVT, superficial thrombosis, or Baker's cyst.  Shawnette Augello, RVS 05/16/2012, 5:09 PM

## 2012-05-16 NOTE — ED Notes (Signed)
Pt is being transported to Vascular.

## 2012-05-16 NOTE — ED Provider Notes (Signed)
History     CSN: [id number]  Arrival date & time 05/16/12  1040   First MD Initiated Contact with Patient 05/16/12 1420      No chief complaint on file.   (Consider location/radiation/quality/duration/timing/severity/associated sxs/prior treatment) HPI The patient presents with Lower R leg pain. Patient states that a knot came up then went down but had discoloration under the skin with pain. She also has some tingling which is chronic and she states that this seemed to be worse. The patient states that she has not had weakness, vomiting, nausea, headache, chest pain, shortness of breath, or fever. The patient denies taking any medications prior to arrival Past Medical History  Diagnosis Date  . HTN (hypertension)   . Hyperlipidemia   . 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 CAD (coronary artery disease)     prior DES Jan 2007 with low risk myoview in 2008  . Back pain     with radiculopathy  . GERD (gastroesophageal reflux disease)   . Glaucoma suspect     Past Surgical History  Procedure Date  . Coronary stent placement Jan 2007    DES to RCA  . Microdiscectomy     L4-L5    No family history on file.  History  Substance Use Topics  . Smoking status: Former Smoker -- 1.0 packs/day for 15 years    Types: Cigarettes    Quit date: 08/25/1996  . Smokeless tobacco: Never Used  . Alcohol Use: No    OB History    Grav Para Term Preterm Abortions TAB SAB Ect Mult Living                  Review of Systems All other systems negative except as documented in the HPI. All pertinent positives and negatives as reviewed in the HPI.  Allergies  Oxycodone hcl er and Penicillins  Home Medications   Current Outpatient Rx  Name  Route  Sig  Dispense  Refill  . ACETAMINOPHEN ER 650 MG PO TBCR   Oral   Take 650 mg by mouth every 8 (eight) hours as needed. For pain         . ASPIRIN EC 81 MG PO TBEC   Oral   Take 1  tablet (81 mg total) by mouth daily.   30 tablet      . TYLENOL ALLERGY SINUS PO   Oral   Take 1 tablet by mouth every 4 (four) hours as needed. For sinus         . CLOPIDOGREL BISULFATE 75 MG PO TABS      TAKE 1 TABLET BY MOUTH EVERY DAY   30 tablet   11   . HYDROCHLOROTHIAZIDE 12.5 MG PO TABS   Oral   Take 1 tablet (12.5 mg total) by mouth daily.   90 tablet   3   . METOPROLOL TARTRATE 50 MG PO TABS   Oral   Take 1 tablet (50 mg total) by mouth 2 (two) times daily.   180 tablet   1   . NITROGLYCERIN 0.4 MG SL SUBL   Sublingual   Place 1 tablet (0.4 mg total) under the tongue every 5 (five) minutes as needed. For up to 3 doses   25 tablet   6     BP 155/78  Pulse 68  Temp 97.7 F (36.5 C) (Oral)  Resp 16  SpO2 99%  Physical Exam  Nursing note and vitals reviewed. Constitutional: She is oriented to person, place, and time. She appears well-developed and well-nourished. No distress.  HENT:  Head: Normocephalic and atraumatic.  Mouth/Throat: Oropharynx is clear and moist.  Eyes: Pupils are equal, round, and reactive to light.  Cardiovascular: Normal rate, regular rhythm and normal heart sounds.  Exam reveals no gallop and no friction rub.   No murmur heard. Pulmonary/Chest: Effort normal and breath sounds normal. No respiratory distress.  Neurological: She is alert and oriented to person, place, and time. She has normal strength. No sensory deficit. She exhibits normal muscle tone. She displays a negative Romberg sign. Coordination and gait normal. GCS eye subscore is 4. GCS verbal subscore is 5. GCS motor subscore is 6.  Skin: Skin is warm and dry.    ED Course  Procedures (including critical care time)  Labs Reviewed  CBC - Abnormal; Notable for the following:    Platelets 616 (*)     All other components within normal limits  BASIC METABOLIC PANEL - Abnormal; Notable for the following:    GFR calc non Af Amer 83 (*)     All other components within  normal limits  URINALYSIS, ROUTINE W REFLEX MICROSCOPIC - Abnormal; Notable for the following:    APPearance CLOUDY (*)     Nitrite POSITIVE (*)     Leukocytes, UA SMALL (*)     All other components within normal limits  URINE MICROSCOPIC-ADD ON - Abnormal; Notable for the following:    Bacteria, UA FEW (*)     Casts HYALINE CASTS (*)     All other components within normal limits  URINE CULTURE   Patient has no sensory or motor deficits or weakness on exam. The patient appears worried about multiple issues. She has had a long history of tingling and numbness per her report.    MDM          Carlyle Dolly, PA-C 05/16/12 1709

## 2012-05-17 NOTE — ED Provider Notes (Signed)
Medical screening examination/treatment/procedure(s) were performed by non-physician practitioner and as supervising physician I was immediately available for consultation/collaboration.  Staria Birkhead R. Amor Packard, MD 05/17/12 0703 

## 2012-05-18 LAB — URINE CULTURE: Colony Count: >=100000

## 2012-05-19 NOTE — ED Notes (Signed)
+   Urine Chart sent to EDP office for review. 

## 2012-05-20 ENCOUNTER — Telehealth (HOSPITAL_COMMUNITY): Payer: Self-pay | Admitting: Emergency Medicine

## 2012-05-20 NOTE — ED Notes (Signed)
Chart returned from EDP office. Prescribed Bactrim DS 1 tab PO BID x 3 days. Prescribed by Emily West PA-C. °

## 2012-05-20 NOTE — ED Notes (Signed)
Rx called in to [facility] on [address] by Shannon Gammons PFM. °

## 2012-06-01 ENCOUNTER — Encounter: Payer: Self-pay | Admitting: Family Medicine

## 2012-06-01 ENCOUNTER — Ambulatory Visit (INDEPENDENT_AMBULATORY_CARE_PROVIDER_SITE_OTHER): Payer: Medicare Other | Admitting: Family Medicine

## 2012-06-01 VITALS — BP 153/84 | HR 83 | Temp 97.5°F | Wt 139.9 lb

## 2012-06-01 DIAGNOSIS — Z23 Encounter for immunization: Secondary | ICD-10-CM

## 2012-06-01 DIAGNOSIS — IMO0002 Reserved for concepts with insufficient information to code with codable children: Secondary | ICD-10-CM

## 2012-06-01 MED ORDER — DULOXETINE HCL 20 MG PO CPEP: 20.0000 mg | ORAL_CAPSULE | Freq: Every day | ORAL | Status: DC

## 2012-06-01 MED ORDER — PNEUMOCOCCAL VAC POLYVALENT 25 MCG/0.5ML IJ INJ: 0.5000 mL | INJECTION | Freq: Once | INTRAMUSCULAR | Status: DC

## 2012-06-01 NOTE — Patient Instructions (Signed)
I want you to start taking 650mg  of Tylenol 3 times per day with meals. I also want you to start taking Cymbalta for your nerve-related pain.  We will start with 20mg  every day.  Please come back to see me in about 2 weeks so we can see how it is working.

## 2012-06-13 NOTE — Progress Notes (Signed)
Patient ID: Natalie [name], female   DOB: [birth year], 78 y.o.   MRN: [id number] Subjective: The patient is a 78 y.o. year old female who presents today for ED f/u.  Seen in ED for leg pain.  Is here today complaining of the same.  Pain is somewhat burning in character although patient has a hard time really characterizing it.  She feels it is in both legs.  It is not associated with any urinary or bowel problems.  Patient does not really take any medications for it as she is not a big fan of them.  She is not interested in gabapentin as it has made her husband sleepy.  Patient's past medical, social, and family history were reviewed and updated as appropriate. History  Substance Use Topics  . Smoking status: Former Smoker -- 1.0 packs/day for 15 years    Types: Cigarettes    Quit date: 08/25/1996  . Smokeless tobacco: Never Used  . Alcohol Use: No   Objective:  Filed Vitals:   06/01/12 1147  BP: 153/84  Pulse: 83  Temp: 97.5 F (36.4 C)   Gen: NAD, mildly anxious CV: RRR Resp: CTABL Ext: No edema, 2+ pulses, 5/5 strength, normal reflexes  Assessment/Plan:  Please also see individual problems in problem list for problem-specific plans.

## 2012-06-14 NOTE — Assessment & Plan Note (Addendum)
Will try Cymbalta for leg pain.  Pt to f/u in 2-3 weeks to see if helpful.  No red flags on exam or history.

## 2012-08-02 ENCOUNTER — Other Ambulatory Visit: Payer: Self-pay | Admitting: Cardiology

## 2012-08-03 MED ORDER — METOPROLOL TARTRATE 50 MG PO TABS: 50.0000 mg | ORAL_TABLET | Freq: Two times a day (BID) | ORAL | Status: DC

## 2012-08-03 MED ORDER — HYDROCHLOROTHIAZIDE 12.5 MG PO TABS: 12.5000 mg | ORAL_TABLET | Freq: Every day | ORAL | Status: DC

## 2012-08-03 NOTE — Addendum Note (Signed)
Addended by: Reine Just on: 08/03/2012 09:26 AM   Modules accepted: Orders

## 2012-09-26 ENCOUNTER — Other Ambulatory Visit: Payer: Self-pay | Admitting: Cardiology

## 2012-09-28 ENCOUNTER — Other Ambulatory Visit: Payer: Self-pay | Admitting: *Deleted

## 2012-09-28 MED ORDER — METOPROLOL TARTRATE 50 MG PO TABS: ORAL_TABLET | ORAL | Status: DC

## 2012-12-09 ENCOUNTER — Other Ambulatory Visit: Payer: Self-pay | Admitting: Cardiology

## 2013-04-08 ENCOUNTER — Other Ambulatory Visit: Payer: Self-pay | Admitting: Cardiology

## 2013-05-06 ENCOUNTER — Ambulatory Visit (INDEPENDENT_AMBULATORY_CARE_PROVIDER_SITE_OTHER): Payer: Medicare Other | Admitting: *Deleted

## 2013-05-06 DIAGNOSIS — Z23 Encounter for immunization: Secondary | ICD-10-CM

## 2013-05-07 ENCOUNTER — Observation Stay (HOSPITAL_COMMUNITY)
Admission: EM | Admit: 2013-05-07 | Discharge: 2013-05-08 | Disposition: A | Discharge status: Home / Self Care | Payer: Medicare Other | Attending: Family Medicine | Admitting: Family Medicine

## 2013-05-07 ENCOUNTER — Encounter (HOSPITAL_COMMUNITY): Payer: Self-pay | Admitting: Emergency Medicine

## 2013-05-07 ENCOUNTER — Emergency Department (HOSPITAL_COMMUNITY): Payer: Medicare Other

## 2013-05-07 ENCOUNTER — Emergency Department (INDEPENDENT_AMBULATORY_CARE_PROVIDER_SITE_OTHER)
Admission: EM | Admit: 2013-05-07 | Discharge: 2013-05-07 | Disposition: A | Discharge status: Nursing Home (Includes ICF) | Payer: 59 | Source: Home / Self Care | Attending: Family Medicine | Admitting: Family Medicine

## 2013-05-07 DIAGNOSIS — I6529 Occlusion and stenosis of unspecified carotid artery: Principal | ICD-10-CM | POA: Insufficient documentation

## 2013-05-07 DIAGNOSIS — G459 Transient cerebral ischemic attack, unspecified: Secondary | ICD-10-CM

## 2013-05-07 DIAGNOSIS — R9431 Abnormal electrocardiogram [ECG] [EKG]: Secondary | ICD-10-CM | POA: Insufficient documentation

## 2013-05-07 DIAGNOSIS — I2510 Atherosclerotic heart disease of native coronary artery without angina pectoris: Secondary | ICD-10-CM | POA: Insufficient documentation

## 2013-05-07 DIAGNOSIS — I10 Essential (primary) hypertension: Secondary | ICD-10-CM

## 2013-05-07 DIAGNOSIS — E785 Hyperlipidemia, unspecified: Secondary | ICD-10-CM | POA: Insufficient documentation

## 2013-05-07 LAB — COMPREHENSIVE METABOLIC PANEL
ALT: 11 U/L (ref 0–35)
AST: 17 U/L (ref 0–37)
Albumin: 4 g/dL (ref 3.5–5.2)
Alkaline Phosphatase: 59 U/L (ref 39–117)
Calcium: 9.6 mg/dL (ref 8.4–10.5)
GFR calc Af Amer: 78 mL/min — ABNORMAL LOW (ref >90)
Glucose, Bld: 104 mg/dL — ABNORMAL HIGH (ref 70–99)
Potassium: 3.6 mEq/L (ref 3.5–5.1)
Sodium: 141 mEq/L (ref 135–145)
Total Protein: 8.2 g/dL (ref 6.0–8.3)

## 2013-05-07 LAB — CBC WITH DIFFERENTIAL/PLATELET
Basophils Absolute: 0 10*3/uL (ref 0.0–0.1)
Eosinophils Absolute: 0.2 10*3/uL (ref 0.0–0.7)
Eosinophils Relative: 3 % (ref 0–5)
Lymphs Abs: 1.5 10*3/uL (ref 0.7–4.0)
MCH: 29.6 pg (ref 26.0–34.0)
Neutrophils Relative %: 65 % (ref 43–77)
Platelets: 553 10*3/uL — ABNORMAL HIGH (ref 150–400)
RBC: 4.26 MIL/uL (ref 3.87–5.11)
RDW: 14.4 % (ref 11.5–15.5)
WBC: 6.6 10*3/uL (ref 4.0–10.5)

## 2013-05-07 MED ORDER — METOPROLOL TARTRATE 25 MG PO TABS
50.0000 mg | ORAL_TABLET | Freq: Once | ORAL | Status: AC
  Administered 2013-05-08: 50 mg via ORAL
  Filled 2013-05-07: qty 2

## 2013-05-07 MED ORDER — ACETAMINOPHEN 325 MG PO TABS
650.0000 mg | ORAL_TABLET | Freq: Once | ORAL | Status: AC
  Administered 2013-05-08: 650 mg via ORAL
  Filled 2013-05-07: qty 2

## 2013-05-07 NOTE — ED Provider Notes (Signed)
Natalie [name] is a 79 y.o. female who presents to Urgent Care today for left arm heaviness, numbness and tingling starting today at about 5 PM. She was quite bothered by the symptoms however they resolve spontaneously after less than about one hour of symptoms. She denies any loss of coordination trouble swallowing or speaking. She additionally notes some vision change that she describes as rapidly moving objects across her field of view.   Currently she notes resolving dizziness. She denies any visual changes currently. She denies any chest pain palpitations or trouble breathing.   Past Medical History  Diagnosis Date  . HTN (hypertension)   . Hyperlipidemia   . 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 CAD (coronary artery disease)     prior DES Jan 2007 with low risk myoview in 2008  . Back pain     with radiculopathy  . GERD (gastroesophageal reflux disease)   . Glaucoma suspect    History  Substance Use Topics  . Smoking status: Former Smoker -- 1.00 packs/day for 15 years    Types: Cigarettes    Quit date: 08/25/1996  . Smokeless tobacco: Never Used  . Alcohol Use: No   ROS as above Medications reviewed. No current facility-administered medications for this encounter.   Current Outpatient Prescriptions  Medication Sig Dispense Refill  . acetaminophen (TYLENOL) 650 MG CR tablet Take 650 mg by mouth every 8 (eight) hours as needed. For pain      . aspirin EC 81 MG tablet Take 1 tablet (81 mg total) by mouth daily.  30 tablet    . Chlorphen-Pseudoephed-APAP (TYLENOL ALLERGY SINUS PO) Take 1 tablet by mouth every 4 (four) hours as needed. For sinus      . clopidogrel (PLAVIX) 75 MG tablet TAKE 1 TABLET BY MOUTH EVERY DAY  30 tablet  5  . DULoxetine (CYMBALTA) 20 MG capsule Take 1 capsule (20 mg total) by mouth daily.  30 capsule  0  . hydrochlorothiazide (HYDRODIURIL) 12.5 MG tablet Take 1 tablet (12.5 mg total) by mouth  daily.  90 tablet  1  . metoprolol (LOPRESSOR) 50 MG tablet TAKE ONE TABLET BY MOUTH TWO TIMES A DAY  60 tablet  1  . nitroGLYCERIN (NITROSTAT) 0.4 MG SL tablet Place 1 tablet (0.4 mg total) under the tongue every 5 (five) minutes as needed. For up to 3 doses  25 tablet  6    Exam:  BP 173/79  Pulse 93  Temp(Src) 97.7 F (36.5 C) (Oral)  Resp 22  SpO2 96% Gen: Well NAD HEENT: EOMI,  MMM, PERRLA Lungs: CTABL Nl WOB Heart: RRR no MRG Abd: NABS, NT, ND Exts: Non edematous BL  LE, warm and well perfused.  Neuro: Alert and oriented. Cranial nerves II through XII are intact. Sensation coordination strength balance and gait are normal  12-lead EKG shows normal sinus rhythm at 88 beats per minute. Short PR interval. On millimeter ST segment elevation and V1, V2, and mild 1 mm depression in V4, and II, III. This is unchanged from prior EKG several weeks ago  Assessment and Plan: 79 y.o. female with TIA involving the left arm occurring approximately 2 hours ago. Patient is currently neurologically intact and normal. She has several pertinent risk factors include poorly controlled hypertension, hyperlipidemia, and coronary artery disease. She was last seen at her primary care provider about one year ago. Her risk for stroke in the next  90 days is approximately 14%. Hypertension patient will be noncompliant with outpatient followup and I believe that she would benefit from workup in the hospital today and tomorrow.  Plan to transfer via shuttle for further evaluation and management.     Rodolph Bong, MD 05/07/13 [phone]

## 2013-05-07 NOTE — ED Provider Notes (Signed)
CSN: [id number]     Arrival date & time 05/07/13  1921 History   First MD Initiated Contact with Patient 05/07/13 2304     Chief Complaint  Patient presents with  . Numbness    HPI  Patient presents with 2 episodes today of left arm weakness and numbness and "heaviness".  She denies facial symptoms or speech issues.  No lower extremity symptoms.  Initially her symptoms lasted for about 20 minutes on her way to the urgent care symptoms resolved and then she returned home.  She began evening again and developed recurrence of her left arm numbness and heaviness and presents the ER for evaluation.  She denies any symptoms at this time.  She states that her symptoms resolved again on the way to the ER.  She has no prior history of stroke but she does have a history of cardiac disease with 2 Stents in Pl.  She's a history of hypertension hypercholesterolemia.  She does not smoke cigarettes.  No history of diabetes.  Her symptoms are mild to moderate in severity when it occurred and now have resolved.  No chest pain or shortness of breath.  Mild headache at this time.  She has not taken her metoprolol this evening.   Past Medical History  Diagnosis Date  . HTN (hypertension)   . Hyperlipidemia   . 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 CAD (coronary artery disease)     prior DES Jan 2007 with low risk myoview in 2008  . Back pain     with radiculopathy  . GERD (gastroesophageal reflux disease)   . Glaucoma suspect    Past Surgical History  Procedure Laterality Date  . Coronary stent placement  Jan 2007    DES to RCA  . Microdiscectomy      L4-L5  . Abdominal hysterectomy    . Back surgery     No family history on file. History  Substance Use Topics  . Smoking status: Former Smoker -- 1.00 packs/day for 15 years    Types: Cigarettes    Quit date: 08/25/1996  . Smokeless tobacco: Never Used  . Alcohol Use: No   OB History   Grav Para  Term Preterm Abortions TAB SAB Ect Mult Living                 Review of Systems  All other systems reviewed and are negative.    Allergies  Oxycodone hcl and Penicillins  Home Medications   Current Outpatient Rx  Name  Route  Sig  Dispense  Refill  . acetaminophen (TYLENOL) 650 MG CR tablet   Oral   Take 650 mg by mouth every 8 (eight) hours as needed. For pain         . aspirin EC 81 MG tablet   Oral   Take 81 mg by mouth daily.         . clopidogrel (PLAVIX) 75 MG tablet   Oral   Take 75 mg by mouth daily.         . metoprolol (LOPRESSOR) 50 MG tablet   Oral   Take 50 mg by mouth 2 (two) times daily.         . nitroGLYCERIN (NITROSTAT) 0.4 MG SL tablet   Sublingual   Place 0.4 mg under the tongue every 5 (five) minutes as needed for chest pain.  BP 166/84  Pulse 56  Temp(Src) 97.8 F (36.6 C) (Oral)  Resp 16  SpO2 98% Physical Exam  Nursing note and vitals reviewed. Constitutional: She is oriented to person, place, and time. She appears well-developed and well-nourished. No distress.  HENT:  Head: Normocephalic and atraumatic.  Eyes: EOM are normal. Pupils are equal, round, and reactive to light.  Neck: Normal range of motion.  Cardiovascular: Normal rate, regular rhythm and normal heart sounds.   Pulmonary/Chest: Effort normal and breath sounds normal.  Abdominal: Soft. She exhibits no distension. There is no tenderness.  Musculoskeletal: Normal range of motion.  Neurological: She is alert and oriented to person, place, and time.  5/5 strength in major muscle groups of  bilateral upper and lower extremities. Speech normal. No facial asymetry.   Skin: Skin is warm and dry.  Psychiatric: She has a normal mood and affect. Judgment normal.    ED Course  Procedures (including critical care time) Labs Review Labs Reviewed  CBC WITH DIFFERENTIAL - Abnormal; Notable for the following:    Platelets 553 (*)    All other components within  normal limits  COMPREHENSIVE METABOLIC PANEL - Abnormal; Notable for the following:    Glucose, Bld 104 (*)    Total Bilirubin 0.2 (*)    GFR calc non Af Amer 67 (*)    GFR calc Af Amer 78 (*)    All other components within normal limits  URINE RAPID DRUG SCREEN (HOSP PERFORMED)  POCT I-STAT TROPONIN I   Imaging Review Ct Head Wo Contrast  05/08/2013   CLINICAL DATA:  Numbness.  EXAM: CT HEAD WITHOUT CONTRAST  TECHNIQUE: Contiguous axial images were obtained from the base of the skull through the vertex without intravenous contrast.  COMPARISON:  05/16/2012  FINDINGS: Atherosclerotic and physiologic intracranial calcifications. Mild atrophy. There is no evidence of acute intracranial hemorrhage, brain edema, mass lesion, acute infarction, mass effect, or midline shift. Acute infarct may be in apparent on noncontrast CT. No other intra-axial abnormalities are seen, and the ventricles and sulci are within normal limits in size and symmetry. No abnormal extra-axial fluid collections or masses are identified. No significant calvarial abnormality.  IMPRESSION: Negative for bleed or other acute intracranial process.   Electronically Signed   By: Oley Balm M.D.   On: 05/08/2013 00:02  I personally reviewed the imaging tests through PACS system I reviewed available ER/hospitalization records through the EMR   ECG interpretation  Date: 05/07/2013  Rate: 88  Rhythm: normal sinus rhythm  QRS Axis: normal  Intervals: normal  ST/T Wave abnormalities: normal  Conduction Disutrbances: none  Narrative Interpretation:   Old EKG Reviewed: No significant changes noted     MDM  No diagnosis found. Her symptoms are concerning for TIA.  She'll be admitted the hospital for TIA workup.  Head CT without acute pathology.  Aspirin are ready today.    Lyanne Co, MD 05/08/13 0010

## 2013-05-07 NOTE — ED Notes (Addendum)
Pt.  transferred from [facility] this evening ,  Reports left arm tingling / numbness onset this afternoon , deneis any pain or discomfort at triage , tingling and numbness resolved at triage , equal grips ,  No arm drift , speech clear / no facial asymmetry . EKG at urgent care - NSR .

## 2013-05-07 NOTE — ED Notes (Signed)
Patient c/o numbness and tingling in left arm around 5:00p.m.Natalie [name] Patient denies chest pain reports left arm is returning to normal sensation.  When arm was numb, reports left foot felt funny.  Patient denies chest pain, pressure or heaviness.  Patient reports intermittent episodes this week of chest pain.  Patient reports dizziness , dizziness has improved since onset.

## 2013-05-08 ENCOUNTER — Other Ambulatory Visit: Payer: Self-pay

## 2013-05-08 ENCOUNTER — Observation Stay (HOSPITAL_COMMUNITY): Payer: Medicare Other

## 2013-05-08 DIAGNOSIS — E785 Hyperlipidemia, unspecified: Secondary | ICD-10-CM

## 2013-05-08 DIAGNOSIS — I10 Essential (primary) hypertension: Secondary | ICD-10-CM

## 2013-05-08 DIAGNOSIS — G459 Transient cerebral ischemic attack, unspecified: Secondary | ICD-10-CM

## 2013-05-08 DIAGNOSIS — I517 Cardiomegaly: Secondary | ICD-10-CM

## 2013-05-08 DIAGNOSIS — I2510 Atherosclerotic heart disease of native coronary artery without angina pectoris: Secondary | ICD-10-CM

## 2013-05-08 LAB — CREATININE, SERUM
Creatinine, Ser: 0.64 mg/dL (ref 0.50–1.10)
GFR calc Af Amer: >90 mL/min (ref >90)

## 2013-05-08 LAB — LIPID PANEL
HDL: 54 mg/dL (ref >39)
Total CHOL/HDL Ratio: 4.1 RATIO
Triglycerides: 165 mg/dL — ABNORMAL HIGH (ref <150)
VLDL: 33 mg/dL (ref 0–40)

## 2013-05-08 LAB — RAPID URINE DRUG SCREEN, HOSP PERFORMED
Amphetamines: NOT DETECTED
Barbiturates: NOT DETECTED
Benzodiazepines: NOT DETECTED
Opiates: NOT DETECTED
Tetrahydrocannabinol: NOT DETECTED

## 2013-05-08 LAB — CBC
HCT: 32.6 % — ABNORMAL LOW (ref 36.0–46.0)
MCH: 29.3 pg (ref 26.0–34.0)
MCHC: 33.1 g/dL (ref 30.0–36.0)
MCV: 88.3 fL (ref 78.0–100.0)
Platelets: 450 10*3/uL — ABNORMAL HIGH (ref 150–400)
RBC: 3.69 MIL/uL — ABNORMAL LOW (ref 3.87–5.11)
RDW: 14.3 % (ref 11.5–15.5)
WBC: 4.8 10*3/uL (ref 4.0–10.5)

## 2013-05-08 LAB — HEMOGLOBIN A1C: Mean Plasma Glucose: 126 mg/dL — ABNORMAL HIGH (ref <117)

## 2013-05-08 LAB — GLUCOSE, CAPILLARY: Glucose-Capillary: 98 mg/dL (ref 70–99)

## 2013-05-08 MED ORDER — SODIUM CHLORIDE 0.9 % IV SOLN: 250.0000 mL | INTRAVENOUS | Status: DC | PRN

## 2013-05-08 MED ORDER — ATORVASTATIN CALCIUM 40 MG PO TABS: 40.0000 mg | ORAL_TABLET | Freq: Every day | ORAL | Status: DC

## 2013-05-08 MED ORDER — HEPARIN SODIUM (PORCINE) 5000 UNIT/ML IJ SOLN
5000.0000 [IU] | Freq: Three times a day (TID) | INTRAMUSCULAR | Status: DC
  Administered 2013-05-08 (×2): 5000 [IU] via SUBCUTANEOUS
  Filled 2013-05-08 (×4): qty 1

## 2013-05-08 MED ORDER — ASPIRIN EC 81 MG PO TBEC
81.0000 mg | DELAYED_RELEASE_TABLET | Freq: Every day | ORAL | Status: DC
  Administered 2013-05-08: 81 mg via ORAL
  Filled 2013-05-08: qty 1

## 2013-05-08 MED ORDER — METOPROLOL TARTRATE 50 MG PO TABS
50.0000 mg | ORAL_TABLET | Freq: Two times a day (BID) | ORAL | Status: DC
  Administered 2013-05-08: 50 mg via ORAL
  Filled 2013-05-08 (×2): qty 1

## 2013-05-08 MED ORDER — ACETAMINOPHEN 325 MG PO TABS: 650.0000 mg | ORAL_TABLET | Freq: Four times a day (QID) | ORAL | Status: DC | PRN

## 2013-05-08 MED ORDER — ATORVASTATIN CALCIUM 40 MG PO TABS
40.0000 mg | ORAL_TABLET | Freq: Every day | ORAL | Status: DC
  Administered 2013-05-08: 40 mg via ORAL
  Filled 2013-05-08: qty 1

## 2013-05-08 MED ORDER — ACETAMINOPHEN ER 650 MG PO TBCR: 650.0000 mg | EXTENDED_RELEASE_TABLET | Freq: Three times a day (TID) | ORAL | Status: DC | PRN

## 2013-05-08 MED ORDER — NITROGLYCERIN 0.4 MG SL SUBL: 0.4000 mg | SUBLINGUAL_TABLET | SUBLINGUAL | Status: DC | PRN

## 2013-05-08 MED ORDER — HYDRALAZINE HCL 20 MG/ML IJ SOLN: 10.0000 mg | INTRAMUSCULAR | Status: DC | PRN

## 2013-05-08 MED ORDER — SODIUM CHLORIDE 0.9 % IJ SOLN
3.0000 mL | Freq: Two times a day (BID) | INTRAMUSCULAR | Status: DC
  Administered 2013-05-08: 3 mL via INTRAVENOUS

## 2013-05-08 MED ORDER — SODIUM CHLORIDE 0.9 % IJ SOLN: 3.0000 mL | INTRAMUSCULAR | Status: DC | PRN

## 2013-05-08 MED ORDER — CLOPIDOGREL BISULFATE 75 MG PO TABS
75.0000 mg | ORAL_TABLET | Freq: Every day | ORAL | Status: DC
  Administered 2013-05-08: 75 mg via ORAL
  Filled 2013-05-08: qty 1

## 2013-05-08 NOTE — Discharge Summary (Signed)
Family Medicine [facility] Discharge Summary  Patient name: Natalie [name] Medical record number: [id number] Date of birth: 09-Jul-1934 Age: 79 y.o. Gender: female Date of Admission: 05/07/2013  Date of Discharge: 05/08/2013  Admitting Physician: Uvaldo Rising, MD  Primary Care Provider: Valera Castle, MD Consultants: None  Indication for Hospitalization: Left arm heaviness - TIA  Discharge Diagnoses/Problem List:  TIA CAD Hypertension  Disposition: Home with daughter  Discharge Condition: Stable  Discharge Exam: Please see progress note from today  Brief Hospital Course: Natalie [name] is a 79 y.o. female who presented with TIA x 2. PMH is significant for CAD s/p stent placement and HTN.   # TIA - Pt's symptoms of arm "heaviness" and numbness are classic for TIA. She does not smoke. She does have known CAD. Last lipid profile in 2010 was normal and pt was not on a statin. She was admitted for observation and TIA workup. MRI/MRA showed no acute stroke but mild atrophy with chronic microvascular changes, carotid dopplers prelim read showed 1-39% ICA stenosis, and echo showed no cardioembolic source and normal EF with Grade I diastolic dysfunction. Home aspirin 81mg  and plavix 75mg  daily were continued and patient was started on lipitor 40mg  and educated on possible side effects. She was set up with outpatient PCP f/u prior to discharge.  # CAD - No active chest pain and EKG was abnormal but unchanged from previous. Troponins were cycled and negative. Pt will need outpatient follow up with her cardiologist.  # Hypertension - BP was elevated in ED and home metoprolol was continued along with PRN hydralazine for SBP>190 or DBP>110, which patient did not need. Consider adding second medication as outpatient if BPs persistently elevated. 136/63 on discharge but had gone as high as 182/93 on day of discharge.  Issues for Follow Up:  - F/u final carotid doppler  read - F/u compliance with statin - Pt needs f/u with her cardiologist - Consider increasing antihypertensive coverage though do not be aggressive in elderly patient  Significant Procedures:   Carotid doppler: *PRELIMINARY RESULTS*  Vascular Ultrasound  Carotid Duplex (Doppler) has been completed. Preliminary findings: Bilateral: 1-39% ICA stenosis. Vertebral artery flow is antegrade.    CT head:  IMPRESSION:  Negative for bleed or other acute intracranial process.   MRI/MRA head:  IMPRESSION:  No focal intracranial mass lesion or evidence for acute infarction.  Mild atrophy with chronic microvascular ischemic change.  No intracranial flow reducing lesion is evident although mild  irregularity of the cavernous and supraclinoid ICA is noted.  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 See discussion above.  Consider further evaluation with extracranial MRA of the neck  without and with contrast.    2D Echocardiogram: Study Conclusions - Left ventricle: The cavity size was normal. Wall thickness was increased in a pattern of mild LVH. Systolic function was normal. The estimated ejection fraction was in the range of 55% to 60%. Wall motion was normal; there were no regional wall motion abnormalities. Doppler parameters are consistent with abnormal left ventricular relaxation (grade 1 diastolic dysfunction). - Mitral valve: Calcified annulus. - Atrial septum: No defect or patent foramen ovale was identified. Impressions: - No cardiac source of emboli was indentified. Transthoracic echocardiography. M-mode, complete 2D, spectral Doppler, and color Doppler. Height: Height: 170.2cm. Height: 67in. Weight: Weight: 64.1kg. Weight: 141lb. Body mass index: BMI: 22.1kg/m^2. Body surface area: BSA: 1.68m^2. Blood pressure: 169/75. Patient status: Inpatient. Location: Echo laboratory.    Significant Labs and Imaging:  Recent Labs Lab 05/07/13 2006 05/08/13 0601  WBC 6.6  4.8  HGB 12.6 10.8*  HCT 37.9 32.6*  PLT 553* 450*    Recent Labs Lab 05/07/13 2006 05/08/13 0601  NA 141  --   K 3.6  --   CL 103  --   CO2 28  --   GLUCOSE 104*  --   BUN 13  --   CREATININE 0.82 0.64  CALCIUM 9.6  --   ALKPHOS 59  --   AST 17  --   ALT 11  --   ALBUMIN 4.0  --     Results/Tests Pending at Time of Discharge:  - Final read on carotid doppler   Discharge Medications:    Medication List         acetaminophen 650 MG CR tablet  Commonly known as:  TYLENOL  Take 650 mg by mouth every 8 (eight) hours as needed. For pain     aspirin EC 81 MG tablet  Take 81 mg by mouth daily.     atorvastatin 40 MG tablet  Commonly known as:  LIPITOR  Take 1 tablet (40 mg total) by mouth daily at 6 PM.     clopidogrel 75 MG tablet  Commonly known as:  PLAVIX  Take 75 mg by mouth daily.     metoprolol 50 MG tablet  Commonly known as:  LOPRESSOR  Take 50 mg by mouth 2 (two) times daily.     nitroGLYCERIN 0.4 MG SL tablet  Commonly known as:  NITROSTAT  Place 0.4 mg under the tongue every 5 (five) minutes as needed for chest pain.        Discharge Instructions: Please refer to Patient Instructions section of EMR for full details.  Patient was counseled important signs and symptoms that should prompt return to medical care, changes in medications, dietary instructions, activity restrictions, and follow up appointments.   Follow-Up Appointments:     Follow-up Information   Follow up with Tana Conch, MD On 05/13/2013. (at 10:30 AM with Dr Durene Cal - please arrive 15 minutes early)    Specialty:  Family Medicine   Contact information:   [address] [place] [address] [phone]       Leona Singleton, MD 05/08/2013, 3:54 PM PGY-2, [facility]

## 2013-05-08 NOTE — Progress Notes (Signed)
*  PRELIMINARY RESULTS* Echocardiogram 2D Echocardiogram has been performed.  Natalie [name] 05/08/2013, 9:15 AM

## 2013-05-08 NOTE — Progress Notes (Signed)
*  PRELIMINARY RESULTS* Vascular Ultrasound Carotid Duplex (Doppler) has been completed.  Preliminary findings: Bilateral:  1-39% ICA stenosis.  Vertebral artery flow is antegrade.     Farrel Demark, RDMS, RVT  05/08/2013, 9:40 AM

## 2013-05-08 NOTE — Progress Notes (Signed)
Nutrition Brief Note  Patient identified on the Malnutrition Screening Tool (MST) Report for recent weight lost without trying.  Per readings below, patient's weight has been stable.  Wt Readings from Last 15 Encounters:  05/08/13 141 lb 1.5 oz (64 kg)  06/01/12 139 lb 14.4 oz (63.458 kg)  04/11/12 146 lb 14.4 oz (66.633 kg)  02/01/12 147 lb 8 oz (66.906 kg)  08/26/11 149 lb (67.586 kg)  08/23/11 146 lb (66.225 kg)  08/19/11 146 lb (66.225 kg)  07/25/11 148 lb (67.132 kg)  08/03/10 143 lb (64.864 kg)  11/25/09 144 lb (65.318 kg)  07/29/09 141 lb 6.4 oz (64.139 kg)  07/20/09 144 lb 4.8 oz (65.454 kg)  05/18/09 152 lb (68.947 kg)  05/14/09 152 lb (68.947 kg)  03/17/09 152 lb (68.947 kg)    Body mass index is 22.09 kg/(m^2). Patient meets criteria for Normal based on current BMI.   Current diet order is Heart Healthy. Patient's daughter reports patient with good appetite.  Labs and medications reviewed.   No nutrition interventions warranted at this time. If nutrition issues arise, please consult RD.   Maureen Chatters, RD, LDN Pager #: [phone] After-Hours Pager #: [phone]

## 2013-05-08 NOTE — H&P (Signed)
FMTS Attending Note  I personally saw and evaluated the patient. The plan of care was discussed with the resident team. I agree with the assessment and plan as documented by the resident.   79 year old female with past medical history of CAD, hypertension, GERD, Colon cancer, Hyperlipidemia presents with 2 separate episodes of left upper extremity numbness and heaviness. These events occurred last evening when the patient was eating dinner. It occurred spontaneously resolved after approximately 20 minutes, after the first episode the patient did have her family take her to the urgent care however since her symptoms resolved when she returned home, when her symptoms occurred again she sought medical care, she is currently asymptomatic, denies any numbness or tingling in any of her extremities, no associated vision changes, no slurred speech, no associated chest pains, no shortness of breath, the patient denies history of TIA or stroke in the past  Vitals: Reviewed, hypertensive General: Pleasant African American female, no acute distress, lying in hospital bed HEENT: Normocephalic, extraocular movements are intact, no scleral icterus, pupils are equal in size bilaterally, nasal septum midline, moist mucous membranes, uvula midline, neck was supple Cardiac: Regular in rhythm, S1 and S2 present, no murmurs, no heaves or thrills Respiratory: Clear to auscultation bilaterally, normal effort Abdomen: Soft, nontender, bowel sounds present Neuro: Cranial nerves intact however did not test for light reaction, strength was 5 out of 5 in all extremities, gross sensation to light-touch was intact in all extremities except for the right lower extremity (chronic numbness in the right lower extremity secondary to back surgery), normal gait, negative pronator drift, able to perform bilateral finger to nose testing  #1. TIA-the patient's symptoms are most likely consistent with a TIA, will complete TIA/stroke workup  including MRA/MRI of the brain, carotid Dopplers, echocardiogram. Patient is currently on aspirin 81 mg and Plavix 75 mg daily. No changes this regimen will be made. #2. CAD-patient currently asymptomatic, EKG was reviewed and was unchanged from previous, trending troponins which have been negative so far #3. Hyperlipidemia-patient currently not on statin therapy, she thinks that she may have been on the medication for her cholesterol in the past however is uncertain, is amenable to initiation of Lipitor 40 mg daily #4. Hypertension-slightly elevated at this time however is permissible given questionable TIA/stroke. Will continue home dose of metoprolol however may likely benefit titration of her beta blocker.  Disposition: Likely home the next 24 hours after testing has been completed.  Donnella Sham MD

## 2013-05-08 NOTE — H&P (Signed)
[facility] [address] Admission History and Physical Service Pager: [phone]  Patient name: Natalie [name] Medical record number: [id number] Date of birth: September 28, 1933 Age: 79 y.o. Gender: female  Primary Care Provider: Valera Castle, MD Consultants: none Code Status: Full  Chief Complaint: left arm heaviness  Assessment and Plan: Natalie [name] is a 79 y.o. female presenting with TIA x2. PMH is significant for CAD s/p stent placement and HTN.  # TIA - Symptoms are classic for TIA.  She does not smoke.  Does have known CAD. Last lipid profile was in 2010 and wnl.  She is not on a statin - admit to observation for TIA work up - MRI/MRA - carotid dopplers - echo - continue home aspirin 81mg  and plavix 75mg  - would likely benefit from a statin given TIA and CAD; no guideline recommendations secondary to her age; will likely start lipitor 40mg  tomorrow; have not discussed with the patient yet  # CAD - No active chest pain.  EKG is abnormal but unchanged from previous. - will cycle troponins - ekg in the am - will need outpatient f/u with her cardiologist   # Hypertension: Quite elevated in the ED. - continue home metoprolol - hydralazine prn written for BP >190/110 - may need to add a second medication  FEN/GI: cardiac diet; SLIV Prophylaxis: SQ heparin  Disposition: admit to observation under Dr. Randolm Idol for TIA work up  History of Present Illness: Natalie [name] is a 79 y.o. female presenting with left arm heaviness.  She reports that this evening she had 2 episodes where her left arm felt heavy and numb.  Each on lasted less than 30 minutes.  She had some mild dizziness and "lights" in the left part of her vision during these episodes.  She currently denies any symptoms.  She had not had any new chest pain or shortness of breath.  She does get short of breath with exertion, but this is not new.  No diaphoresis, confusion, altered speech,  or syncope.  She has CAD s/p stenting.  She is on aspirin 81mg  and plavix 75mg  for this and took her doses the day of these events.  She went to urgent care for evaluation and was transferred to the ED to start the TIA work up.  Head CT in the ED was unremarkable.  Review Of Systems: Per HPI with the following additions: none Otherwise 12 point review of systems was performed and was unremarkable.  Patient Active Problem List   Diagnosis Date Noted  . TIA (transient ischemic attack) 05/08/2013  . Spine pain, lumbosacral 07/25/2011  . UTI 07/29/2009  . GERD 07/20/2009  . HERNIATED LUMBAR DISK WITH RADICULOPATHY 05/18/2009  . RADICULOPATHY 05/14/2009  . CAD, NATIVE VESSEL 09/22/2008  . COLON CANCER 10/18/2007  . HYPERLIPIDEMIA 10/18/2007  . Essential hypertension, benign 10/18/2007   Past Medical History: Past Medical History  Diagnosis Date  . HTN (hypertension)   . Hyperlipidemia   . 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 CAD (coronary artery disease)     prior DES Jan 2007 with low risk myoview in 2008  . Back pain     with radiculopathy  . GERD (gastroesophageal reflux disease)   . Glaucoma suspect    Past Surgical History: Past Surgical History  Procedure Laterality Date  . Coronary stent placement  Jan 2007    DES to RCA  . Microdiscectomy  L4-L5  . Abdominal hysterectomy    . Back surgery     Social History: History  Substance Use Topics  . Smoking status: Former Smoker -- 1.00 packs/day for 15 years    Types: Cigarettes    Quit date: 08/25/1996  . Smokeless tobacco: Never Used  . Alcohol Use: No   Additional social history: lives at home with her husband  Please also refer to relevant sections of EMR.  Family History: No family history on file. Allergies and Medications: Allergies  Allergen Reactions  . Oxycodone Hcl Nausea And Vomiting  . Penicillins Rash   No current facility-administered  medications on file prior to encounter.   Current Outpatient Prescriptions on File Prior to Encounter  Medication Sig Dispense Refill  . acetaminophen (TYLENOL) 650 MG CR tablet Take 650 mg by mouth every 8 (eight) hours as needed. For pain        Objective: BP 171/151  Pulse 71  Temp(Src) 97.8 F (36.6 C) (Oral)  Resp 12  SpO2 99% Exam: General: alert, cooperative, NAD HEENT: AT/[place], sclera white, MMM, PERRL Cardiovascular: RRR, no murmurs Respiratory: CTAB, no wheezes or rales Abdomen: +BS, soft, NTND Extremities: no edema, 1+ DP pulses Skin: no rashes or lesions Neuro: alert, oriented, CN II-XII intact, 5/5 strength in all extremities, sensation grossly intact to light touch throughout  Labs and Imaging: CBC BMET   Recent Labs Lab 05/07/13 2006  WBC 6.6  HGB 12.6  HCT 37.9  PLT 553*    Recent Labs Lab 05/07/13 2006  NA 141  K 3.6  CL 103  CO2 28  BUN 13  CREATININE 0.82  GLUCOSE 104*  CALCIUM 9.6     POC troponin: negative  CT Head:  Negative for bleed or other acute intracranial process.  EKG: nsr; normal axis; normal intervals; non-specific ST changes that are unchanged from previous  Charm Rings, MD 05/08/2013, 12:52 AM PGY-3, [facility] Medicine FPTS Intern pager: [phone], text pages welcome

## 2013-05-08 NOTE — Progress Notes (Signed)
I have been appointed Ms. Berenda Morale  new PCP. This is my first time meeting her. I have discussed current hospitalization with her and would be happy to follow-up with her after discharge. I agree with the excellent care provided by the primary team of [facility] and want to thank them for their continued efforts in caring for my patient.  Kuneff, Renee DO

## 2013-05-09 ENCOUNTER — Telehealth: Payer: Self-pay | Admitting: Family Medicine

## 2013-05-09 MED ORDER — ROSUVASTATIN CALCIUM 5 MG PO TABS: 5.0000 mg | ORAL_TABLET | Freq: Every day | ORAL | Status: DC

## 2013-05-09 NOTE — Telephone Encounter (Signed)
Pt called because she was given lipitor and she is having side effects from this. She has taken lipitor in the past and it does not work for her. She would like something else prescribed. jw

## 2013-05-09 NOTE — Telephone Encounter (Signed)
Will forward to MD. Jazmin Hartsell,CMA  

## 2013-05-09 NOTE — Telephone Encounter (Signed)
Dr Claiborne Billings is PCP even though it says Valera Castle MD is PCP (he is a cardiologist). I have changed this in care teams. FWDing to PCP and to Dr Durene Cal with whom she has her hospital follow up.  Leona Singleton, MD 05/09/2013 1:58 PM

## 2013-05-09 NOTE — Telephone Encounter (Signed)
Patient with CAD and TIA history from problem list. I would not advise her to stop a statin permanently as this helps stabilize plaques in the heart, not just lower cholesterol. I am sending in an alternative medicine (Crestor). She can stop the medicines temporarily but the maximal medical therapy would include a cholesterol lowering medicine.   Alternative dosing schedules could potentially be discussed in the future if the change does not help her.   Thanks, Dr. Durene Cal  P.S. I am happy for any input from PCP Dr. Claiborne Billings if she would like an alternative to this.

## 2013-05-09 NOTE — Telephone Encounter (Signed)
Spoke with patient, she c/o of headache, insomnia and achy body which are the same side effects as last time she was on lipitor.  She was supposed to go pick up her refill today but does not want to waste the money.  Advised since she has appt on Monday it would be ok to wait till then to discuss with MD.  Also that I would verify and give her a call back if he says something different.  FYI:  Pt wants to discuss if it was possible for her to try and decrease her cholesterol by diet and exercise.  She is agreeable to discussing this with MD at appt. [place], Maryjo [place]

## 2013-05-09 NOTE — Discharge Summary (Signed)
Family Medicine Teaching Service  Discharge Note : Attending Alexander Mcauley MD Pager [phone] Office [phone] I have seen and examined this patient, reviewed their chart and discussed discharge planning wit the resident at the time of discharge. I agree with the discharge plan as above.  

## 2013-05-09 NOTE — Telephone Encounter (Signed)
Spoke with Ms. Gadea and I told her that the DOPPLER CAROTID came back stable and she does not need to make changes to her medications.

## 2013-05-13 ENCOUNTER — Ambulatory Visit (INDEPENDENT_AMBULATORY_CARE_PROVIDER_SITE_OTHER): Payer: 59 | Admitting: Family Medicine

## 2013-05-13 ENCOUNTER — Encounter: Payer: Self-pay | Admitting: Family Medicine

## 2013-05-13 VITALS — BP 145/75 | HR 76 | Temp 98.1°F | Wt 140.8 lb

## 2013-05-13 DIAGNOSIS — I10 Essential (primary) hypertension: Secondary | ICD-10-CM

## 2013-05-13 DIAGNOSIS — I2510 Atherosclerotic heart disease of native coronary artery without angina pectoris: Secondary | ICD-10-CM

## 2013-05-13 DIAGNOSIS — E785 Hyperlipidemia, unspecified: Secondary | ICD-10-CM

## 2013-05-13 DIAGNOSIS — G459 Transient cerebral ischemic attack, unspecified: Secondary | ICD-10-CM

## 2013-05-13 MED ORDER — PRAVASTATIN SODIUM 80 MG PO TABS
80.0000 mg | ORAL_TABLET | Freq: Every day | ORAL | Status: DC
Start: 2013-05-13 — End: 2013-06-19

## 2013-05-13 NOTE — Patient Instructions (Signed)
I am glad you have not had any more arm symptoms.   I have sent in Pravastatin for your cholesterol and to help prevent plaques from getting worse. We discussed lipitor and crestor and your preference was to continue what worked best for you before.   Please schedule a follow up visit with your cardiologist.  Keep taking your plavix and aspirin.   Your blood pressure was slightly elevated today but I do not think it is high enough considering your last blood pressure was good to make any changes today.   See Dr. Claiborne Billings AT LEAST every 6 months or sooner if you have any problems, Dr. Nils Pyle can discuss these with her at your next visit: Health Maintenance Due  Topic Date Due  . Tetanus/tdap  08/29/1954  . Zostavax  08/30/1995

## 2013-05-15 NOTE — Assessment & Plan Note (Signed)
Continue aspirin. Start pravastatin although higher potency statin would be preferred (patient refuses)

## 2013-05-15 NOTE — Assessment & Plan Note (Addendum)
On aspirin and plavix. No recurrent symptoms with nonfocal neurological exam. Started statin therapy.

## 2013-05-15 NOTE — Assessment & Plan Note (Signed)
Isolated systolic hypertension. Last read systolic <140. Will monitor for now and continue metoprolol.

## 2013-05-15 NOTE — Assessment & Plan Note (Signed)
Poorly controlled not on statin. Patient intolerant of lipitor in the past. States she is only willing to try pravastatin again. Discussed with patient would prefer higher potency statin with CAD history and now with TIA but she refuses.

## 2013-05-15 NOTE — Progress Notes (Signed)
[facility] Tana Conch, MD Phone: [phone]  Subjective:  Chief complaint-noted   78 year old nonsmoker with known CAD here for Hospital follow up for TIA. Patient also with hyperlipidemia and hypertension. Patient admitted on 05/07/13 for arm heaviness  And numbness lasting less than 24 hours.  MRI/MRA showed no acute stroke but did reveal microvascular changes. Carotid dopplers without stenosis bilaterally. Echo with no cardio embolic source but did note grade I diastolic dysfunction. Troponins x 2 were also negative. Patient was on aspirin 81 mg and Plavix on admission and these were continued. Patient was also started on lipitor as she had not been on a statin previously. Patient states may years ago she was on pravastatin and did ok with it but states she has been on Lipitor before and did not tolerate it (muscle cramps). She is adamant today about only trying pravastatin again. Compliant with metoprolol.  ROS-no chest pain, shortness of breath, no further arm heaviness or numbness, no extremity weakness.   Past Medical History Patient Active Problem List   Diagnosis Date Noted  . CAD, NATIVE VESSEL 09/22/2008    Priority: High  . TIA (transient ischemic attack) 05/08/2013    Priority: Medium  . HYPERLIPIDEMIA 10/18/2007    Priority: Medium  . Essential hypertension, benign 10/18/2007    Priority: Medium  . Spine pain, lumbosacral 07/25/2011  . GERD 07/20/2009  . HERNIATED LUMBAR DISK WITH RADICULOPATHY 05/18/2009  . RADICULOPATHY 05/14/2009  . COLON CANCER 10/18/2007    Medications- reviewed and updated Current Outpatient Prescriptions on File Prior to Visit  Medication Sig Dispense Refill  . acetaminophen (TYLENOL) 650 MG CR tablet Take 650 mg by mouth every 8 (eight) hours as needed. For pain      . aspirin EC 81 MG tablet Take 81 mg by mouth daily.      . clopidogrel (PLAVIX) 75 MG tablet Take 75 mg by mouth daily.      . metoprolol  (LOPRESSOR) 50 MG tablet Take 50 mg by mouth 2 (two) times daily.      . nitroGLYCERIN (NITROSTAT) 0.4 MG SL tablet Place 0.4 mg under the tongue every 5 (five) minutes as needed for chest pain.       No current facility-administered medications on file prior to visit.    Objective: BP 145/75  Pulse 76  Temp(Src) 98.1 F (36.7 C) (Oral)  Wt 140 lb 12.8 oz (63.866 kg) Gen: NAD, resting comfortably in chair HEENT:  MMM, PERRLA  CV: RRR no mrg  Lungs: CTAB  Abd: soft/nontender/nondistended/normal bowel sounds  MSK: moves all extremities, no edema  Neuro: CN II-XII intact, sensation and reflexes normal throughout, 5/5 muscle strength in bilateral upper and lower extremities. Normal finger to nose.   Assessment/Plan:

## 2013-06-14 ENCOUNTER — Other Ambulatory Visit: Payer: Self-pay | Admitting: Cardiology

## 2013-06-18 ENCOUNTER — Other Ambulatory Visit: Payer: Self-pay

## 2013-06-18 MED ORDER — METOPROLOL TARTRATE 50 MG PO TABS: 50.0000 mg | ORAL_TABLET | Freq: Two times a day (BID) | ORAL | Status: DC

## 2013-06-19 ENCOUNTER — Ambulatory Visit (INDEPENDENT_AMBULATORY_CARE_PROVIDER_SITE_OTHER): Payer: Medicare Other | Admitting: Cardiology

## 2013-06-19 ENCOUNTER — Encounter: Payer: Self-pay | Admitting: Cardiology

## 2013-06-19 VITALS — BP 149/76 | HR 79 | Ht 67.0 in | Wt 136.0 lb

## 2013-06-19 DIAGNOSIS — R079 Chest pain, unspecified: Secondary | ICD-10-CM

## 2013-06-19 DIAGNOSIS — I739 Peripheral vascular disease, unspecified: Secondary | ICD-10-CM

## 2013-06-19 DIAGNOSIS — R0609 Other forms of dyspnea: Secondary | ICD-10-CM

## 2013-06-19 DIAGNOSIS — R0600 Dyspnea, unspecified: Secondary | ICD-10-CM

## 2013-06-19 DIAGNOSIS — R0989 Other specified symptoms and signs involving the circulatory and respiratory systems: Secondary | ICD-10-CM

## 2013-06-19 DIAGNOSIS — F0280 Dementia in other diseases classified elsewhere without behavioral disturbance: Secondary | ICD-10-CM

## 2013-06-19 MED ORDER — ESOMEPRAZOLE MAGNESIUM 20 MG PO PACK: 20.0000 mg | PACK | Freq: Every day | ORAL | Status: DC

## 2013-06-19 NOTE — Patient Instructions (Addendum)
**Note De-Identified  Obfuscation** Your physician has recommended you make the following change in your medication: start taking Nexium 20 mg daily.   Your physician has requested that you have a lexiscan myoview. For further information please visit [email or web address] Please follow instruction sheet, as given.  Your physician has requested that you have a lower or upper extremity arterial duplex. This test is an ultrasound of the arteries in the legs or arms. It looks at arterial blood flow in the legs and arms. Allow one hour for Lower and Upper Arterial scans. There are no restrictions or special instructions  Your physician is referring you to Neurology and to our Lipid clinic    Your physician recommends that you schedule a follow-up appointment in: after tests

## 2013-06-19 NOTE — Progress Notes (Signed)
Patient ID: Natalie [name], female   DOB: 11/22/1933, 79 y.o.   MRN: [id number]    Patient Name: Natalie [name] Date of Encounter: 06/19/2013  Primary Care Provider:  Felix Pacini, DO Primary Cardiologist:  Natalie [name], H  Problem List   Past Medical History  Diagnosis Date  . HTN (hypertension)   . Hyperlipidemia   . 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 CAD (coronary artery disease)     prior DES Jan 2007 with low risk myoview in 2008  . Back pain     with radiculopathy  . GERD (gastroesophageal reflux disease)   . Glaucoma suspect    Past Surgical History  Procedure Laterality Date  . Coronary stent placement  Jan 2007    DES to RCA  . Microdiscectomy      L4-L5  . Abdominal hysterectomy    . Back surgery     Allergies  Allergies  Allergen Reactions  . Oxycodone Hcl Nausea And Vomiting  . Penicillins Rash   HPI  Natalie [name] is a former patient of Natalie [name]. She has a history of known CAD with remote PCI to the RCA back in 2007. Her last stress Myoview was in February 2013 which demonstrated an ejection fraction of 78% with no ischemia.  She was evaluated in October 2014 for TIA that presented with arm heaviness and numbness lasting less than 24 hours. MRI/MRA showed no acute stroke but did reveal microvascular changes. Carotid dopplers showed 0-39% stenosis bilaterally. Echo with no cardio embolic source but did note grade I diastolic dysfunction. Troponins x 2 were also negative. Patient was on aspirin 81 mg and Plavix on admission and these were continued.   The patient was also started on pravastatin as she didn't tolerate lipitor in the past. She stopped pravastatin as well due to the muscle pain.  Today she reports indigestion that occurs after certain foods and is always  as she had not been on a statin previously. Patient complains of DOE especially after walking stairs. Her BP has been  elevated for quite some time.  She also complains of lower extremity heaviness and pain after walking stairs, right worse then left.  Home Medications  Prior to Admission medications   Medication Sig Start Date End Date Taking? Authorizing Provider  acetaminophen (TYLENOL) 650 MG CR tablet Take 650 mg by mouth every 8 (eight) hours as needed. For pain   Yes Historical Provider, MD  aspirin EC 81 MG tablet Take 81 mg by mouth daily.   Yes Historical Provider, MD  clopidogrel (PLAVIX) 75 MG tablet Take 75 mg by mouth daily.   Yes Historical Provider, MD  metoprolol (LOPRESSOR) 50 MG tablet Take 1 tablet (50 mg total) by mouth 2 (two) times daily. 06/18/13  Yes Natalie Masson, MD   Family History  No family history on file.  Social History  History   Social History  . Marital Status: Married    Spouse Name: N/A    Number of Children: N/A  . Years of Education: N/A   Occupational History  . Not on file.   Social History Main Topics  . Smoking status: Former Smoker -- 1.00 packs/day for 15 years    Types: Cigarettes    Quit date: 08/25/1996  . Smokeless tobacco: Never Used  . Alcohol Use: No  . Drug Use: No  . Sexual Activity: Not on file  Other Topics Concern  . Not on file   Social History Narrative  . No narrative on file     Review of Systems, as per HPI, otherwise negative General:  No chills, fever, night sweats or weight changes.  Cardiovascular:  No chest pain, dyspnea on exertion, edema, orthopnea, palpitations, paroxysmal nocturnal dyspnea. Dermatological: No rash, lesions/masses Respiratory: No cough, dyspnea Urologic: No hematuria, dysuria Abdominal:   No nausea, vomiting, diarrhea, bright red blood per rectum, melena, or hematemesis Neurologic:  No visual changes, wkns, changes in mental status. All other systems reviewed and are otherwise negative except as noted above.  Physical Exam  Blood pressure 149/76, pulse 79, height 5\' 7"  (1.702 m),  weight 136 lb (61.689 kg).  General: Pleasant, NAD Psych: Normal affect. Neuro: Alert and oriented X 3. Moves all extremities spontaneously. HEENT: Normal  Neck: Supple without bruits or JVD. Lungs:  Resp regular and unlabored, CTA. Heart: RRR no s3, s4, or murmurs. Abdomen: Soft, non-tender, non-distended, BS + x 4.  Extremities: No clubbing, cyanosis or edema. DP/PT weak B/L /Radials 2+ and equal bilaterally.  Labs:  No results found for this basename: CKTOTAL, CKMB, TROPONINI,  in the last 72 hours Lab Results  Component Value Date   WBC 4.8 05/08/2013   HGB 10.8* 05/08/2013   HCT 32.6* 05/08/2013   MCV 88.3 05/08/2013   PLT 450* 05/08/2013   No results found for this basename: NA, K, CL, CO2, BUN, CREATININE, CALCIUM, LABALBU, PROT, BILITOT, ALKPHOS, ALT, AST, GLUCOSE,  in the last 168 hours Lab Results  Component Value Date   CHOL 220* 05/08/2013   HDL 54 05/08/2013   LDLCALC 133* 05/08/2013   TRIG 165* 05/08/2013    US Carotids: 05/08/2013 - The vertebral arteries appear patent with antegrade flow. - Findings consistent with 1-39 percent stenosis involving the right internal carotid artery and the left internal carotid artery. - ICA/CCA ratio. right =1.4. left = 1.39 Other specific details can be found in the table(s) above. Prepared and Electronically Authenticated by  Echocardiogram 06/09/2013  - Left ventricle: The cavity size was normal. Wall thickness was increased in a pattern of mild LVH. Systolic function was normal. The estimated ejection fraction was in the range of 55% to 60%. Wall motion was normal; there were no regional wall motion abnormalities. Doppler parameters are consistent with abnormal left ventricular relaxation (grade 1 diastolic dysfunction). - Mitral valve: Calcified annulus. - Atrial septum: No defect or patent foramen ovale was identified. Impressions:  - No cardiac source of emboli was indentified.  Accessory Clinical  Findings  ECG - SR, non-specific ST-T wave abnormalities    Assessment & Plan  79 year old patient   1. CAD, s/p PCI to RCA, currently no chest pain, DOE appears as she is deconditioned, normal stress test in 2014, preserved LV EF   2. Hypertension - uncontrolled with LVH and grade I diastolic dysfunction, we will start amlodipine 2.5 mg po daily  3. Hyperlipidemia - concerning especially with h/o CAD and TIA, intolerant to statins, we will refer her to the lipid clinic  4. Indigestion, GERD - we will prescribe Nexium 24 H 20 mg po daily  5. Claudications - check B/L lower extremity Duplex  6. TIA, concern about possible Alzheimer disease - we will refer to Neurology  Follow up in 1 month  Natalie [name], Rexene Edison, MD, [facility] 06/19/2013, 10:30 AM

## 2013-06-20 ENCOUNTER — Ambulatory Visit: Payer: Medicare Other | Admitting: Pharmacist

## 2013-06-24 ENCOUNTER — Ambulatory Visit (HOSPITAL_COMMUNITY): Payer: Medicare Other | Attending: Cardiovascular Disease

## 2013-06-24 ENCOUNTER — Encounter: Payer: Self-pay | Admitting: Cardiovascular Disease

## 2013-06-24 ENCOUNTER — Encounter (INDEPENDENT_AMBULATORY_CARE_PROVIDER_SITE_OTHER): Payer: Self-pay

## 2013-06-24 DIAGNOSIS — E785 Hyperlipidemia, unspecified: Secondary | ICD-10-CM | POA: Insufficient documentation

## 2013-06-24 DIAGNOSIS — I10 Essential (primary) hypertension: Secondary | ICD-10-CM | POA: Insufficient documentation

## 2013-06-24 DIAGNOSIS — Z8673 Personal history of transient ischemic attack (TIA), and cerebral infarction without residual deficits: Secondary | ICD-10-CM | POA: Insufficient documentation

## 2013-06-24 DIAGNOSIS — I739 Peripheral vascular disease, unspecified: Secondary | ICD-10-CM

## 2013-06-24 DIAGNOSIS — I70219 Atherosclerosis of native arteries of extremities with intermittent claudication, unspecified extremity: Secondary | ICD-10-CM | POA: Insufficient documentation

## 2013-06-24 DIAGNOSIS — I2510 Atherosclerotic heart disease of native coronary artery without angina pectoris: Secondary | ICD-10-CM | POA: Insufficient documentation

## 2013-06-25 ENCOUNTER — Encounter: Payer: Medicare Other | Admitting: Cardiology

## 2013-06-28 ENCOUNTER — Telehealth: Payer: Self-pay | Admitting: Cardiology

## 2013-06-28 NOTE — Telephone Encounter (Signed)
Follow Up ° °Pt returned call for results//SR  °

## 2013-07-15 ENCOUNTER — Encounter: Payer: Self-pay | Admitting: Cardiovascular Disease

## 2013-07-15 ENCOUNTER — Ambulatory Visit (HOSPITAL_COMMUNITY): Payer: Medicare Other | Attending: Cardiovascular Disease | Admitting: Radiology

## 2013-07-15 VITALS — BP 161/97 | Ht 67.0 in | Wt 138.0 lb

## 2013-07-15 DIAGNOSIS — R0609 Other forms of dyspnea: Secondary | ICD-10-CM

## 2013-07-15 DIAGNOSIS — R0989 Other specified symptoms and signs involving the circulatory and respiratory systems: Secondary | ICD-10-CM | POA: Insufficient documentation

## 2013-07-15 DIAGNOSIS — I2510 Atherosclerotic heart disease of native coronary artery without angina pectoris: Secondary | ICD-10-CM

## 2013-07-15 DIAGNOSIS — R079 Chest pain, unspecified: Secondary | ICD-10-CM | POA: Insufficient documentation

## 2013-07-15 DIAGNOSIS — R0600 Dyspnea, unspecified: Secondary | ICD-10-CM

## 2013-07-15 MED ORDER — TECHNETIUM TC 99M SESTAMIBI GENERIC - CARDIOLITE
10.0000 | Freq: Once | INTRAVENOUS | Status: AC | PRN
  Administered 2013-07-15: 10 via INTRAVENOUS

## 2013-07-15 MED ORDER — TECHNETIUM TC 99M SESTAMIBI GENERIC - CARDIOLITE
30.0000 | Freq: Once | INTRAVENOUS | Status: AC | PRN
  Administered 2013-07-15: 30 via INTRAVENOUS

## 2013-07-15 MED ORDER — REGADENOSON 0.4 MG/5ML IV SOLN
0.4000 mg | Freq: Once | INTRAVENOUS | Status: AC
  Administered 2013-07-15: 0.4 mg via INTRAVENOUS

## 2013-07-15 NOTE — Progress Notes (Signed)
  [place] [address] [place], [place] [address] [phone]    Cardiology Nuclear Med Study  Natalie [name] is a 80 y.o. female     MRN : [id number]     DOB: [birth year]  Procedure Date: 07/15/2013  Nuclear Med Background Indication for Stress Test:  Evaluation for Ischemia and Stent Patency History:  2007 Stent RCA MPI: 2013 EF: 78%, 05/2013 ECHO: EF: 55-60% Cardiac Risk Factors: History of Smoking, Hypertension and Lipids  Symptoms:  DOE, Palpitations and SOB   Nuclear Pre-Procedure Caffeine/Decaff Intake:  None NPO After: 7:00pm   Lungs:  clear O2 Sat: 96% on room air. IV 0.9% NS with Angio Cath:  22g  IV Site: R Forearm  IV Started by:  Matilde Haymaker, RN  Chest Size (in):  34 Cup Size: B  Height: 5\' 7"  (1.702 m)  Weight:  138 lb (62.596 kg)  BMI:  Body mass index is 21.61 kg/(m^2). Tech Comments:  No Lopressor x 12 hrs    Nuclear Med Study 1 or 2 day study: 1 day  Stress Test Type:  Lexiscan  Reading MD: n/a  Order Authorizing Provider:  Filiberto Pinks  Resting Radionuclide: Technetium 54m Sestamibi  Resting Radionuclide Dose: 11.0 mCi   Stress Radionuclide:  Technetium 14m Sestamibi  Stress Radionuclide Dose: 33.0 mCi           Stress Protocol Rest HR: 65 Stress HR: 112  Rest BP: 161/97 Stress BP: 189/83  Exercise Time (min): n/a METS: n/a   Predicted Max HR: 143 bpm % Max HR: 78.32 bpm Rate Pressure Product: 21168   Dose of Adenosine (mg):  n/a Dose of Lexiscan: 0.4 mg  Dose of Atropine (mg): n/a Dose of Dobutamine: n/a mcg/kg/min (at max HR)  Stress Test Technologist: Perrin Maltese, EMT-P  Nuclear Technologist:  Charlton Amor, CNMT     Rest Procedure:  Myocardial perfusion imaging was performed at rest 45 minutes following the intravenous administration of Technetium 28m Sestamibi. Rest ECG: NSR-LVH  Stress Procedure:  The patient received IV Lexiscan 0.4 mg over 15-seconds.  Technetium 61m  Sestamibi injected at 30-seconds.This patient had throat tightness and abdominal pain with the Lexiscan injection. Quantitative spect images were obtained after a 45 minute delay. Stress ECG: No significant change from baseline ECG  QPS Raw Data Images:  Normal; no motion artifact; normal heart/lung ratio. Stress Images:  Normal homogeneous uptake in all areas of the myocardium. Rest Images:  Normal homogeneous uptake in all areas of the myocardium. Subtraction (SDS):  Normal Transient Ischemic Dilatation (Normal <1.22):  0.97 Lung/Heart Ratio (Normal <0.45):  0.28  Quantitative Gated Spect Images QGS EDV:  56 ml QGS ESV:  22 ml  Impression Exercise Capacity:  Lexiscan with no exercise. BP Response:  Normal blood pressure response. Clinical Symptoms:  Cough, throat tightness and abdominal pain ECG Impression:  No significant ST segment change suggestive of ischemia. Comparison with Prior Nuclear Study: No images to compare  Overall Impression:  Normal stress nuclear study.  LV Ejection Fraction: 60%.  LV Wall Motion:  NL LV Function; NL Wall Motion  [place]

## 2013-07-18 ENCOUNTER — Telehealth: Payer: Self-pay | Admitting: Family Medicine

## 2013-07-18 ENCOUNTER — Other Ambulatory Visit: Payer: Self-pay | Admitting: Family Medicine

## 2013-07-18 ENCOUNTER — Telehealth: Payer: Self-pay | Admitting: *Deleted

## 2013-07-18 MED ORDER — CLOPIDOGREL BISULFATE 75 MG PO TABS
75.0000 mg | ORAL_TABLET | Freq: Every day | ORAL | Status: DC
Start: 2013-07-18 — End: 2013-07-18

## 2013-07-18 MED ORDER — CLOPIDOGREL BISULFATE 75 MG PO TABS: 75.0000 mg | ORAL_TABLET | Freq: Every day | ORAL | Status: DC

## 2013-07-18 NOTE — Progress Notes (Signed)
Quick Note:  Patient notified of her stress test results as being "negative". Patient verbalized understanding and agreement with current treatment plan. She is requesting a refill of her Plavix be called in to [facility] on [address]. Completed. ______

## 2013-07-18 NOTE — Telephone Encounter (Signed)
Pt called and would like a refill on her Plavix. jw

## 2013-07-18 NOTE — Telephone Encounter (Signed)
Patient notified of her stress test results as being "negative". Patient verbalized understanding and agreement with current treatment plan. She is requesting a refill of her Plavix be called in to [facility] on [address] Completed.

## 2013-07-23 ENCOUNTER — Ambulatory Visit (INDEPENDENT_AMBULATORY_CARE_PROVIDER_SITE_OTHER): Payer: Medicare Other | Admitting: Neurology

## 2013-07-23 ENCOUNTER — Encounter: Payer: Self-pay | Admitting: Neurology

## 2013-07-23 VITALS — BP 146/84 | HR 74 | Ht 67.0 in | Wt 141.0 lb

## 2013-07-23 DIAGNOSIS — R413 Other amnesia: Secondary | ICD-10-CM

## 2013-07-23 LAB — TSH: TSH: 2.35 u[IU]/mL (ref 0.35–5.50)

## 2013-07-23 LAB — VITAMIN B12: VITAMIN B 12: 335 pg/mL (ref 211–911)

## 2013-07-23 NOTE — Progress Notes (Signed)
[facility] Clinic Note - Initial Visit   Date: 07/23/2013    Natalie [name] MRN: [id number] DOB: 04/03/1934   Dear Dr Meda Coffee:  Thank you for your kind referral of Natalie [name] for consultation of memory loss. Although her history is well known to you, please allow [place] to reiterate it for the purpose of our medical record. The patient was accompanied to the clinic by husband who also provides collateral information.     History of Present Illness: Natalie [name] is a 80 y.o. right-handed African American female with history of CAD s/p PCI to RCA, hypertension, hyperlipidemia, GERD, colon cancer s/p resection (2000), TIA (October 2014) presenting for evaluation of memory loss.    She reports having mild problems with short-term memory for the past several years.  She forgets where she places things and says that she recently put photographs somewhere and still has not found them.  She is not misplacing things in inappropriate places. She has some problems with appointments and dates.  She does not drive much anymore because she feels nervous.  She has not been in any motor vehicle accidents.  She takes care of own finances, cooks for everyone, and cleans the home, but she wishes that she did not have all these responsibilities, because she does not have time for herself.  Mood is sometimes down, because she does not want to get up in the morning.  She still has hobbies such as reading, crossword puzzles, gardening, and stays very active in her church community.  Her highest level of education was 3 years of college for child development.  In October 2014, she was evaluated TIA which manifested with two spells of left arm numbness.  MRI/MRA showed no acute stroke. Carotid dopplers showed 0-39% stenosis bilaterally. Surface echo did not show a cardio embolic source. Patient was on aspirin 81 mg and Plavix on admission and these were continued.   She was started on statin, but stopped this due to myalgias.  Denies any new neurological symptoms such as changes in vision, numbness and tingling, or weakness.   Dementia questions Memory Are you repeating things excessively?  Not really, but somestimes to her grand children and husband Are you forgetting important details of conversations/events? No, but she can tune unimportant things out Are you prone to misplacing items more now than in the past?  Yes  Can you tell me about some recent headlines?  Natalie [place] [name], Natalie [name]  Executive Are you having trouble managing financial matters?  No Are you taking your medications regularly w/o prompting? No Can you organize and prepare a large holiday meal for multiple people?  No Can you multitask effectively?  Yes  Language Do you have any word finding difficulties?  No  Do you have trouble following instructions or a conversation?  No  Have you been avoiding reading or writing due to problems with recognition or remembering words?  No Are you using generalities when speaking because of memory trouble? No  Visuospatial Are you getting lost while driving?  No Are you getting turned around in your own home?  No Do you have trouble recognizing familiar faces/family members/close friends?  No Do you have trouble dressing, putting on cloths?  No   Out-side paper records, electronic medical record, and images have been reviewed where available and summarized as:  MRI brain 05/08/2013:  No focal intracranial mass lesion or evidence for acute infarction. Mild atrophy  with chronic microvascular ischemic change.    MRA brain 05/08/2013:  No intracranial flow reducing lesion is evident although mild irregularity of the cavernous and supraclinoid ICA is noted. Possible cervical ICA fibromuscular dysplasia  US carotids 04/28/2013: 1-39 percent stenosis involving the right internal carotid artery and the left internal  carotid artery.  Lab Results  Component Value Date   HGBA1C 6.0* 05/08/2013   Lab Results  Component Value Date   CHOL 220* 05/08/2013   HDL 54 05/08/2013   LDLCALC 133* 05/08/2013   LDLDIRECT 143.7 07/30/2007   TRIG 165* 05/08/2013   CHOLHDL 4.1 05/08/2013     Past Medical History  Diagnosis Date  . HTN (hypertension)   . Hyperlipidemia   . 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 CAD (coronary artery disease)     prior DES Jan 2007 with low risk myoview in 2008  . Back pain     with radiculopathy  . GERD (gastroesophageal reflux disease)   . Glaucoma suspect     Past Surgical History  Procedure Laterality Date  . Coronary stent placement  Jan 2007    DES to RCA  . Microdiscectomy      L4-L5  . Abdominal hysterectomy    . Back surgery       Medications:  Current Outpatient Prescriptions on File Prior to Visit  Medication Sig Dispense Refill  . acetaminophen (TYLENOL) 650 MG CR tablet Take 650 mg by mouth every 8 (eight) hours as needed. For pain      . aspirin EC 81 MG tablet Take 81 mg by mouth daily.      . clopidogrel (PLAVIX) 75 MG tablet Take 1 tablet (75 mg total) by mouth daily.  30 tablet  5  . metoprolol (LOPRESSOR) 50 MG tablet Take 1 tablet (50 mg total) by mouth 2 (two) times daily.  60 tablet  1   No current facility-administered medications on file prior to visit.    Allergies:  Allergies  Allergen Reactions  . Oxycontin [Oxycodone Hcl] Nausea And Vomiting  . Penicillins Rash    Family History: Family History  Problem Relation Age of Onset  . Stroke Father     Deceased, 41s  . Diabetes Brother   . Healthy Son     Social History: History   Social History  . Marital Status: Married    Spouse Name: N/A    Number of Children: N/A  . Years of Education: N/A   Occupational History  . Not on file.   Social History Main Topics  . Smoking status: Former Smoker -- 1.00 packs/day for 15  years    Types: Cigarettes    Quit date: 08/25/1996  . Smokeless tobacco: Never Used  . Alcohol Use: No  . Drug Use: No  . Sexual Activity: Not on file   Other Topics Concern  . Not on file   Social History Narrative   She lives with husband, daughter, and grand daughter.   She is a retired Consulting civil engineer.    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    Review of Systems:  CONSTITUTIONAL: No fevers, chills, night sweats, + 10lb weight loss.   EYES: No visual changes or eye pain ENT: No hearing changes.  No history of nose bleeds.   RESPIRATORY: No cough, wheezing and shortness of breath.   CARDIOVASCULAR: Negative for  chest pain, and palpitations.   GI: Negative for abdominal discomfort, blood in stools or black stools.  No recent change in bowel habits.   GU:  No history of incontinence.   MUSCLOSKELETAL: No history of joint pain or swelling.  No myalgias.   SKIN: Negative for lesions, rash, and itching.   HEMATOLOGY/ONCOLOGY: Negative for prolonged bleeding, bruising easily, and swollen nodes.   ENDOCRINE: Negative for cold or heat intolerance, polydipsia or goiter.   PSYCH:  +depression or anxiety symptoms.   NEURO: As Above.   Vital Signs:  BP 146/84  Pulse 74  Ht 5\' 7"  (1.702 m)  Wt 141 lb (63.957 kg)  BMI 22.08 kg/m2  Neurological Exam: Alert: normal   Oriented: x3   Dressed:well-groomed  Eye Contact:normal  Facial Expression: blunted at times   Psychomotor agitation: none  Psychomotor retardation: none  Speech/Language: normal    Paraphrasic errors:none  Hesitation/Stuttering: None  Dysarthria:none  Mood: OK      Affect: Blunted  Thought Content: Good    Hallucinations:No   Delusions:none   Responding to internal stimuli:N/A   Cognitive Exam   MOCA test 22/30 (-2 visuospatial, -2 attention, -2 abstraction, -1 delayed recall, -1 orientation) Fund of knowledge: Good  Abstraction: good  Insight: Good  Apraxia:  Normal  Current news: killed terrorists in [place], fires in [place]  CRANIAL NERVES: II:  No visual field defects.  Unremarkable fundi.   III-IV-VI: Pupils equal round and reactive to light.  Normal conjugate, extra-ocular eye movements in all directions of gaze.  No nystagmus.  No ptosis.   V:  Normal facial sensation.  Jaw jerk is absent  VII:  Normal facial symmetry and movements. Snout and Myeron's present.   VIII:  Normal hearing and vestibular function.   IX-X:  Normal palatal movement.   XI:  Normal shoulder shrug and head rotation.   XII:  Normal tongue strength and range of motion, no deviation or fasciculation.  MOTOR:  No atrophy, fasciculations or abnormal movements.  No pronator drift.  Tone is normal.    Right Upper Extremity:    Left Upper Extremity:    Deltoid  5/5   Deltoid  5/5   Biceps  5/5   Biceps  5/5   Triceps  5/5   Triceps  5/5   Wrist extensors  5/5   Wrist extensors  5/5   Wrist flexors  5/5   Wrist flexors  5/5   Finger extensors  5/5   Finger extensors  5/5   Finger flexors  5/5   Finger flexors  5/5   Dorsal interossei  5/5   Dorsal interossei  5/5   Abductor pollicis  5/5   Abductor pollicis  5/5   Tone (Ashworth scale)  0  Tone (Ashworth scale)  0   Right Lower Extremity:    Left Lower Extremity:    Hip flexors  5/5   Hip flexors  5/5   Hip extensors  5/5   Hip extensors  5/5   Knee flexors  5/5   Knee flexors  5/5   Knee extensors  5/5   Knee extensors  5/5   Dorsiflexors  5/5   Dorsiflexors  5/5   Plantarflexors  5/5   Plantarflexors  5/5   Toe extensors  5/5   Toe extensors  5/5   Toe flexors  5/5   Toe flexors  5/5   Tone (Ashworth scale)  0  Tone (Ashworth scale)  0  MSRs:  Right                                                                 Left brachioradialis 2+  brachioradialis 2+  biceps 2+  biceps 2+  triceps 2+  triceps 2+  patellar 2+  patellar 2+  ankle jerk 0  ankle jerk 0  Hoffman no  Hoffman no  plantar response down   plantar response down   SENSORY:  Vibration is reduced to 80% at the ankles bilaterally otherwise normal and symmetric perception of light touch, pinprick, and proprioception.  Romberg's sign absent.   COORDINATION/GAIT: Normal finger-to- nose-finger and heel-to-shin.  Intact rapid alternating movements bilaterally.  Able to rise from a chair without using arms.  Gait narrow based and stable. Tandem and stressed gait intact.    IMPRESSION: Natalie [name] is a 80 year-old female presenting for evaluation of memory loss and recent TIA.  There are mild deficits in nearly all domains of cognition.  Imaging of her brain shows microvascular changes throughout and although her history does not really fit a step-wise progression of symptoms, vascular dementia is possible given that she did so well on recall, which is much more involved in Alzheimer's dementia. I would like to screen for treatable causes of memory loss and will also send her for formal neuropsychiatric testing. I explained that depression can contribute to memory loss and offered a trial of antidepressants, which she would like to think about.  I also discussed starting aricept, but patient is not keen on taking medications and would like to complete work-up first, which is reasonable  Regarding her recent TIA, her workup was unrevealing. She did not have a transthoracic echocardiogram or extended telemetry, but no abnormalities were seen on the surface echocardiogram or telemetry where she was hospitalized. She has been stable for the past several months. If she continues to have intermittent neurological deficits, additional testing can't be performed. In the meantime, I stressed the importance of secondary stroke prevention.   PLAN/RECOMMENDATIONS:  1.  Check Vitamin B12, TSH, vitamin E, MMA 2.  Formal neuropsychiatric testing 3.  Continue aspirin 81 for secondary risk prevention 4.  She stopped statins due to myalgias, so I recommended  healthy dietary choices to manage cholesterol 5.  Return to clinic in 6-weeks  The duration of this appointment visit was 45 minutes of face-to-face time with the patient.  Greater than 50% of this time was spent in counseling, explanation of diagnosis, planning of further management, and coordination of care.   Thank you for allowing me to participate in patient's care.  If I can answer any additional questions, I would be pleased to do so.    Sincerely,    Donika K. Posey Pronto, DO

## 2013-07-23 NOTE — Patient Instructions (Addendum)
1. Check blood work 2. Formal neuropsychiatric testing  3. Encouraged maintaining active lifestyle, engaging in cognitive stimulating activities (crossword puzzles, reading, etc), and having scheduled sleep habits 4. Return to clinic in 6-8 weeks

## 2013-07-25 LAB — METHYLMALONIC ACID, SERUM: METHYLMALONIC ACID, QUANT: 0.14 umol/L (ref <0.40)

## 2013-07-26 LAB — VITAMIN E
GAMMA-TOCOPHEROL (VIT E): 3.9 mg/L (ref <=4.3)
VITAMIN E (ALPHA TOCOPHEROL): 16.1 mg/L (ref 5.7–19.9)

## 2013-08-19 ENCOUNTER — Other Ambulatory Visit: Payer: Self-pay | Admitting: *Deleted

## 2013-08-19 MED ORDER — METOPROLOL TARTRATE 50 MG PO TABS: 50.0000 mg | ORAL_TABLET | Freq: Two times a day (BID) | ORAL | Status: DC

## 2013-09-02 ENCOUNTER — Encounter: Payer: Self-pay | Admitting: Cardiology

## 2013-09-02 ENCOUNTER — Ambulatory Visit (INDEPENDENT_AMBULATORY_CARE_PROVIDER_SITE_OTHER): Payer: Medicare Other | Admitting: Cardiology

## 2013-09-02 VITALS — BP 157/82 | HR 89 | Ht 67.0 in | Wt 138.0 lb

## 2013-09-02 DIAGNOSIS — I10 Essential (primary) hypertension: Secondary | ICD-10-CM

## 2013-09-02 DIAGNOSIS — G459 Transient cerebral ischemic attack, unspecified: Secondary | ICD-10-CM

## 2013-09-02 DIAGNOSIS — R0789 Other chest pain: Secondary | ICD-10-CM | POA: Insufficient documentation

## 2013-09-02 DIAGNOSIS — R079 Chest pain, unspecified: Secondary | ICD-10-CM

## 2013-09-02 DIAGNOSIS — K219 Gastro-esophageal reflux disease without esophagitis: Secondary | ICD-10-CM

## 2013-09-02 DIAGNOSIS — E785 Hyperlipidemia, unspecified: Secondary | ICD-10-CM

## 2013-09-02 DIAGNOSIS — I2510 Atherosclerotic heart disease of native coronary artery without angina pectoris: Secondary | ICD-10-CM

## 2013-09-02 MED ORDER — METOPROLOL TARTRATE 50 MG PO TABS: 50.0000 mg | ORAL_TABLET | Freq: Two times a day (BID) | ORAL | Status: DC

## 2013-09-02 MED ORDER — AMLODIPINE BESYLATE 2.5 MG PO TABS
2.5000 mg | ORAL_TABLET | Freq: Every day | ORAL | Status: DC
Start: 2013-09-02 — End: 2014-09-04

## 2013-09-02 MED ORDER — CLOPIDOGREL BISULFATE 75 MG PO TABS: 75.0000 mg | ORAL_TABLET | Freq: Every day | ORAL | Status: DC

## 2013-09-02 NOTE — Progress Notes (Signed)
Patient ID: Natalie [name], female   DOB: 27-Sep-1933, 80 y.o.   MRN: [id number]    Patient Name: Natalie [name] Date of Encounter: 09/02/2013  Primary Care Provider:  Howard Pouch, DO Primary Cardiologist:  Ena Dawley, H  Problem List   Past Medical History  Diagnosis Date  . HTN (hypertension)   . Hyperlipidemia   . 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 CAD (coronary artery disease)     prior DES Jan 2007 with low risk myoview in 2008  . Back pain     with radiculopathy  . GERD (gastroesophageal reflux disease)   . Glaucoma suspect    Past Surgical History  Procedure Laterality Date  . Coronary stent placement  Jan 2007    DES to RCA  . Microdiscectomy      L4-L5  . Abdominal hysterectomy    . Back surgery     Allergies  Allergies  Allergen Reactions  . Oxycontin [Oxycodone Hcl] Nausea And Vomiting  . Penicillins Rash   HPI  Natalie [name] is a former patient of Dr Verl Blalock. She has a history of known CAD with remote PCI to the RCA back in 2007. Her last stress Myoview was in February 2013 which demonstrated an ejection fraction of 78% with no ischemia.  She was evaluated in October 2014 for TIA that presented with arm heaviness and numbness lasting less than 24 hours. MRI/MRA showed no acute stroke but did reveal microvascular changes. Carotid dopplers showed 0-39% stenosis bilaterally. Echo with no cardio embolic source but did note grade I diastolic dysfunction. Troponins x 2 were also negative. Patient was on aspirin 81 mg and Plavix on admission and these were continued.   The patient was also started on pravastatin as she didn't tolerate lipitor in the past. She stopped pravastatin as well due to the muscle pain.  Today she reports indigestion that occurs after certain foods and is always  as she had not been on a statin previously. Patient complains of DOE especially after walking stairs. Her  BP has been elevated for quite some time.  She also complains of lower extremity heaviness and pain after walking stairs, right worse then left.  Home Medications  Prior to Admission medications   Medication Sig Start Date End Date Taking? Authorizing Provider  acetaminophen (TYLENOL) 650 MG CR tablet Take 650 mg by mouth every 8 (eight) hours as needed. For pain   Yes Historical Provider, MD  aspirin EC 81 MG tablet Take 81 mg by mouth daily.   Yes Historical Provider, MD  clopidogrel (PLAVIX) 75 MG tablet Take 75 mg by mouth daily.   Yes Historical Provider, MD  metoprolol (LOPRESSOR) 50 MG tablet Take 1 tablet (50 mg total) by mouth 2 (two) times daily. 06/18/13  Yes Dorothy Spark, MD   Family History  Family History  Problem Relation Age of Onset  . Stroke Father     Deceased, 82s  . Diabetes Brother   . Healthy Son     Social History  History   Social History  . Marital Status: Married    Spouse Name: N/A    Number of Children: N/A  . Years of Education: N/A   Occupational History  . Not on file.   Social History Main Topics  . Smoking status: Former Smoker -- 1.00 packs/day for 15 years    Types: Cigarettes    Quit date:  08/25/1996  . Smokeless tobacco: Never Used  . Alcohol Use: No  . Drug Use: No  . Sexual Activity: Not on file   Other Topics Concern  . Not on file   Social History Narrative   She lives with husband, daughter, and grand daughter.   She is a retired Consulting civil engineer.    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     Review of Systems, as per HPI, otherwise negative General:  No chills, fever, night sweats or weight changes.  Cardiovascular:  No chest pain, dyspnea on exertion, edema, orthopnea, palpitations, paroxysmal nocturnal dyspnea. Dermatological: No rash, lesions/masses Respiratory: No cough, dyspnea Urologic: No hematuria, dysuria Abdominal:   No nausea, vomiting, diarrhea,  bright red blood per rectum, melena, or hematemesis Neurologic:  No visual changes, wkns, changes in mental status. All other systems reviewed and are otherwise negative except as noted above.  Physical Exam  Blood pressure 157/82, pulse 89, height 5\' 7"  (1.702 m), weight 138 lb (62.596 kg).  General: Pleasant, NAD Psych: Normal affect. Neuro: Alert and oriented X 3. Moves all extremities spontaneously. HEENT: Normal  Neck: Supple without bruits or JVD. Lungs:  Resp regular and unlabored, CTA. Heart: RRR no s3, s4, or murmurs. Abdomen: Soft, non-tender, non-distended, BS + x 4.  Extremities: No clubbing, cyanosis or edema. DP/PT weak B/L /Radials 2+ and equal bilaterally.  Labs:  No results found for this basename: CKTOTAL, CKMB, TROPONINI,  in the last 72 hours Lab Results  Component Value Date   WBC 4.8 05/08/2013   HGB 10.8* 05/08/2013   HCT 32.6* 05/08/2013   MCV 88.3 05/08/2013   PLT 450* 05/08/2013   No results found for this basename: NA, K, CL, CO2, BUN, CREATININE, CALCIUM, LABALBU, PROT, BILITOT, ALKPHOS, ALT, AST, GLUCOSE,  in the last 168 hours Lab Results  Component Value Date   CHOL 220* 05/08/2013   HDL 54 05/08/2013   LDLCALC 133* 05/08/2013   TRIG 165* 05/08/2013    US Carotids: 05/08/2013 - The vertebral arteries appear patent with antegrade flow. - Findings consistent with 1-39 percent stenosis involving the right internal carotid artery and the left internal carotid artery. - ICA/CCA ratio. right =1.4. left = 1.39 Other specific details can be found in the table(s) above. Prepared and Electronically Authenticated by  Echocardiogram 06/09/2013  - Left ventricle: The cavity size was normal. Wall thickness was increased in a pattern of mild LVH. Systolic function was normal. The estimated ejection fraction was in the range of 55% to 60%. Wall motion was normal; there were no regional wall motion abnormalities. Doppler parameters are consistent with  abnormal left ventricular relaxation (grade 1 diastolic dysfunction). - Mitral valve: Calcified annulus. - Atrial septum: No defect or patent foramen ovale was identified. Impressions: - No cardiac source of emboli was indentified.  Accessory Clinical Findings  ECG - SR, non-specific ST-T wave abnormalities    Assessment & Plan  80 year old patient   1. CAD, s/p PCI to RCA, currently no chest pain, DOE appears as she is deconditioned, normal stress test in 2014, preserved LV EF   2. Hypertension - uncontrolled with LVH and grade I diastolic dysfunction, we will start amlodipine 2.5 mg po daily  3. Hyperlipidemia - concerning especially with h/o CAD and TIA, intolerant to statins, we will refer her to the lipid clinic  4. Indigestion, GERD - seems that her chest pains are related to GERD, but  refused to take nexium as Tums work  5. Claudications - normal B/L lower extremity Duplex  6. TIA, concern about possible Alzheimer disease - we will refer to Neurology  Follow up in 6  month  Dorothy Spark, MD, [facility] 09/02/2013, 4:13 PM

## 2013-09-02 NOTE — Patient Instructions (Signed)
Your physician is referring you to our [facility], we will arrange.  Your physician has recommended you make the following change in your medication: start taking Amlodipine 2.5 mg daily.  Your physician wants you to follow-up in: 6 months. You will receive a reminder letter in the mail two months in advance. If you don't receive a letter, please call our office to schedule the follow-up appointment.

## 2013-09-16 ENCOUNTER — Ambulatory Visit (INDEPENDENT_AMBULATORY_CARE_PROVIDER_SITE_OTHER): Payer: Medicare Other | Admitting: Pharmacist

## 2013-09-16 VITALS — Wt 137.0 lb

## 2013-09-16 DIAGNOSIS — Z79899 Other long term (current) drug therapy: Secondary | ICD-10-CM

## 2013-09-16 DIAGNOSIS — E785 Hyperlipidemia, unspecified: Secondary | ICD-10-CM

## 2013-09-16 MED ORDER — PRAVASTATIN SODIUM 10 MG PO TABS: ORAL_TABLET | ORAL | Status: DC

## 2013-09-16 MED ORDER — FISH OIL 1000 MG PO CPDR
1000.0000 mg | DELAYED_RELEASE_CAPSULE | Freq: Every day | ORAL | Status: DC
Start: 2013-09-16 — End: 2015-02-23

## 2013-09-16 NOTE — Patient Instructions (Signed)
1.  Start pravastatin 10 mg once daily in the evening. 2.  If you start getting muscle or joint aches, start taking Co-Enzyme Q-10 200 mg daily (over the counter supplement). 3.  Diet - continue 4.  Start fish oil- 1000 mg capsule - take 1 daily (high amount omega 3 fatty acids) 5.  Recheck cholesterol and liver in 3 months (12/23/13 - fasting labs).  See me the following day 12/24/13 to review labs.

## 2013-09-16 NOTE — Assessment & Plan Note (Addendum)
After long discussion, patient is willing to try pravastatin 10 mg qhs.  I encouraged her to add Co-Q 10 to this, but she doesn't want to add a supplement.  We would hopefully titrate pravastatin slowly in future if needed.  Given she failed pravastatin at high dose of 80 mg in past, hopefully she'll be able to tolerate such a low dose.  Crestor at 1-2 times weekly may be an option if she can't tolerate pravastatin.  We discussed this, and she is open to this.  Cost with brand name agents is a concern however.  Patient will add fish oil 1 g/d also for hopefully memory benefit.  Continue current diet, and recheck labs in 3 months. Plan: 1.  Start pravastatin 10 mg once daily in the evening. 2.  If you start getting muscle or joint aches, start taking Co-Enzyme Q-10 200 mg daily (over the counter supplement). 3.  Diet - continue 4.  Start fish oil- 1000 mg capsule - take 1 daily (high amount omega 3 fatty acids) 5.  Recheck cholesterol and liver in 3 months (12/23/13 - fasting labs).  See me the following day 12/24/13 to review labs.

## 2013-09-16 NOTE — Progress Notes (Signed)
Patient is a pleasant 80 y.o. AAF referred to lipid clinic by Dr. Meda Coffee given h/o CAD and recent TIA (04/2013) and failed 2 statins in the past.  Her LDL was 133 in 04/2013 off lipid lowering therapy.  She failed lipitor 40 mg and pravastatin 80 mg due to joint/muscle pain.  She stated it took a couple of weeks of therapy before symptoms started on statins.  Cost is a factor, so will try to use medications which are generic if possible.  Patient is concerned about Alzheimer's in the future.  Patient is apprehensive about taking lipid lowering therapy, as she is fearful of statins.  Zetia could be an issue given cost.    RF:  CAD (PCI 2007), TIA (04/2013), HTN, age - LDL goal < 70, non-HDL goal < 100 Meds:  Not on lipid lowering therapy. Intolerant:  lipitor 40 mg (joint pain), pravastatin 80 mg (joint pain) Never tried lower doses of either of these statins.  Family history:  Father-CVA, Brother-DM Social history:  Denies alcohol use.  Denies tobacco use. Diet:  Typically skips breakfast.  Eats leftovers for lunch.  Dinner is chicken/fish, with vegetable and starch.  Rarely eats red meat.  Lucendia Herrlich food only once weekly.  Doesn't drink soda or tea.  Drinks lots of water.  Does eat chocolate often in the evening for a dessert. Exercise:  None  Labs:   04/2013:  TC 220, LDL 133, HDL 54, TG 165 (not on lipid lowering meds)  Current Outpatient Prescriptions  Medication Sig Dispense Refill  . acetaminophen (TYLENOL) 650 MG CR tablet Take 650 mg by mouth every 8 (eight) hours as needed. For pain      . amLODipine (NORVASC) 2.5 MG tablet Take 1 tablet (2.5 mg total) by mouth daily.  30 tablet  11  . aspirin EC 81 MG tablet Take 81 mg by mouth daily.      . clopidogrel (PLAVIX) 75 MG tablet Take 1 tablet (75 mg total) by mouth daily.  30 tablet  11  . metoprolol (LOPRESSOR) 50 MG tablet Take 1 tablet (50 mg total) by mouth 2 (two) times daily.  60 tablet  11   No current facility-administered  medications for this visit.   Allergies  Allergen Reactions  . Lipitor [Atorvastatin]     Muscle aches on 40 mg daily  . Oxycontin [Oxycodone Hcl] Nausea And Vomiting  . Penicillins Rash   Family History  Problem Relation Age of Onset  . Stroke Father     Deceased, 3s  . Diabetes Brother   . Healthy Son

## 2013-09-20 ENCOUNTER — Ambulatory Visit: Payer: Medicare Other | Admitting: Neurology

## 2013-10-25 ENCOUNTER — Encounter (HOSPITAL_COMMUNITY): Payer: Self-pay | Admitting: Emergency Medicine

## 2013-10-25 ENCOUNTER — Emergency Department (HOSPITAL_COMMUNITY)
Admission: EM | Admit: 2013-10-25 | Discharge: 2013-10-25 | Disposition: A | Discharge status: Home / Self Care | Payer: Medicare Other | Source: Home / Self Care | Attending: Family Medicine | Admitting: Family Medicine

## 2013-10-25 DIAGNOSIS — J309 Allergic rhinitis, unspecified: Secondary | ICD-10-CM

## 2013-10-25 MED ORDER — OLOPATADINE HCL 0.1 % OP SOLN: 1.0000 [drp] | Freq: Two times a day (BID) | OPHTHALMIC | Status: DC

## 2013-10-25 MED ORDER — IPRATROPIUM BROMIDE 0.06 % NA SOLN: 2.0000 | Freq: Four times a day (QID) | NASAL | Status: DC

## 2013-10-25 MED ORDER — LORATADINE 10 MG PO TABS: 10.0000 mg | ORAL_TABLET | Freq: Every day | ORAL | Status: DC

## 2013-10-25 NOTE — Discharge Instructions (Signed)
Allergic Rhinitis Allergic rhinitis is when the mucous membranes in the nose respond to allergens. Allergens are particles in the air that cause your body to have an allergic reaction. This causes you to release allergic antibodies. Through a chain of events, these eventually cause you to release histamine into the blood stream. Although meant to protect the body, it is this release of histamine that causes your discomfort, such as frequent sneezing, congestion, and an itchy, runny nose.  CAUSES  Seasonal allergic rhinitis (hay fever) is caused by pollen allergens that may come from grasses, trees, and weeds. Year-round allergic rhinitis (perennial allergic rhinitis) is caused by allergens such as house dust mites, pet dander, and mold spores.  SYMPTOMS   Nasal stuffiness (congestion).  Itchy, runny nose with sneezing and tearing of the eyes. DIAGNOSIS  Your health care provider can help you determine the allergen or allergens that trigger your symptoms. If you and your health care provider are unable to determine the allergen, skin or blood testing may be used. TREATMENT  Allergic Rhinitis does not have a cure, but it can be controlled by:  Medicines and allergy shots (immunotherapy).  Avoiding the allergen. Hay fever may often be treated with antihistamines in pill or nasal spray forms. Antihistamines block the effects of histamine. There are over-the-counter medicines that may help with nasal congestion and swelling around the eyes. Check with your health care provider before taking or giving this medicine.  If avoiding the allergen or the medicine prescribed do not work, there are many new medicines your health care provider can prescribe. Stronger medicine may be used if initial measures are ineffective. Desensitizing injections can be used if medicine and avoidance does not work. Desensitization is when a patient is given ongoing shots until the body becomes less sensitive to the allergen.  Make sure you follow up with your health care provider if problems continue. HOME CARE INSTRUCTIONS It is not possible to completely avoid allergens, but you can reduce your symptoms by taking steps to limit your exposure to them. It helps to know exactly what you are allergic to so that you can avoid your specific triggers. SEEK MEDICAL CARE IF:   You have a fever.  You develop a cough that does not stop easily (persistent).  You have shortness of breath.  You start wheezing.  Symptoms interfere with normal daily activities. Document Released: 03/22/2001 Document Revised: 04/17/2013 Document Reviewed: 03/04/2013 [facility] Patient Information 2014 [place].  Hay Fever Hay fever is an allergic reaction to particles in the air. It cannot be passed from person to person. It cannot be cured, but it can be controlled. CAUSES  Hay fever is caused by something that triggers an allergic reaction (allergens). The following are examples of allergens:  Ragweed.  Feathers.  Animal dander.  Grass and tree pollens.  Cigarette smoke.  House dust.  Pollution. SYMPTOMS   Sneezing.  Runny or stuffy nose.  Tearing eyes.  Itchy eyes, nose, mouth, throat, skin, or other area.  Sore throat.  Headache.  Decreased sense of smell or taste. DIAGNOSIS Your caregiver will perform a physical exam and ask questions about the symptoms you are having.Allergy testing may be done to determine exactly what triggers your hay fever.  TREATMENT   Over-the-counter medicines may help symptoms. These include:  Antihistamines.  Decongestants. These may help with nasal congestion.  Your caregiver may prescribe medicines if over-the-counter medicines do not work.  Some people benefit from allergy shots when other medicines are  not helpful. HOME CARE INSTRUCTIONS   Avoid the allergen that is causing your symptoms, if possible.  Take all medicine as told by your caregiver. SEEK MEDICAL  CARE IF:   You have severe allergy symptoms and your current medicines are not helping.  Your treatment was working at one time, but you are now experiencing symptoms.  You have sinus congestion and pressure.  You develop a fever or headache.  You have thick nasal discharge.  You have asthma and have a worsening cough and wheezing. SEEK IMMEDIATE MEDICAL CARE IF:   You have swelling of your tongue or lips.  You have trouble breathing.  You feel lightheaded or like you are going to faint.  You have cold sweats.  You have a fever. Document Released: 06/27/2005 Document Revised: 09/19/2011 Document Reviewed: 09/22/2010 [facility] Patient Information 2014 [place].

## 2013-10-25 NOTE — ED Notes (Signed)
Patient concerned about 1 week duration of dry hacking cough, runny nose. NAD at present

## 2013-10-25 NOTE — ED Provider Notes (Signed)
CSN: [id number]     Arrival date & time 10/25/13  1209 History   First MD Initiated Contact with Patient 10/25/13 1308     Chief Complaint  Patient presents with  . URI   (Consider location/radiation/quality/duration/timing/severity/associated sxs/prior Treatment) Patient is a 80 y.o. female presenting with URI. The history is provided by the patient.  URI Presenting symptoms: congestion and rhinorrhea   Presenting symptoms: no fatigue, no fever and no sore throat   Presenting symptoms comment:  Headache, itchy eyes, post nasal drainage Severity:  Mild Onset quality:  Gradual Duration:  6 days Timing:  Constant Progression:  Unchanged Ineffective treatments: Little relief when using Tylenol Cold & Sinus. Associated symptoms: headaches   Associated symptoms: no arthralgias, no myalgias, no neck pain, no sinus pain, no sneezing, no swollen glands and no wheezing     Past Medical History  Diagnosis Date  . HTN (hypertension)   . Hyperlipidemia   . 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 CAD (coronary artery disease)     prior DES Jan 2007 with low risk myoview in 2008  . Back pain     with radiculopathy  . GERD (gastroesophageal reflux disease)   . Glaucoma suspect    Past Surgical History  Procedure Laterality Date  . Coronary stent placement  Jan 2007    DES to RCA  . Microdiscectomy      L4-L5  . Abdominal hysterectomy    . Back surgery     Family History  Problem Relation Age of Onset  . Stroke Father     Deceased, 25s  . Diabetes Brother   . Healthy Son    History  Substance Use Topics  . Smoking status: Former Smoker -- 1.00 packs/day for 15 years    Types: Cigarettes    Quit date: 08/25/1996  . Smokeless tobacco: Never Used  . Alcohol Use: No   OB History   Grav Para Term Preterm Abortions TAB SAB Ect Mult Living                 Review of Systems  Constitutional: Negative for fever and fatigue.   HENT: Positive for congestion, postnasal drip and rhinorrhea. Negative for sneezing and sore throat.   Eyes: Negative.   Respiratory: Negative for wheezing.   Gastrointestinal: Positive for nausea. Negative for vomiting, abdominal pain, diarrhea and constipation.  Genitourinary: Negative.   Musculoskeletal: Negative for arthralgias, myalgias and neck pain.  Skin: Negative.   Neurological: Positive for headaches.    Allergies  Lipitor; Oxycontin; and Penicillins  Home Medications   Prior to Admission medications   Medication Sig Start Date End Date Taking? Authorizing Provider  acetaminophen (TYLENOL) 650 MG CR tablet Take 650 mg by mouth every 8 (eight) hours as needed. For pain   Yes Historical Provider, MD  amLODipine (NORVASC) 2.5 MG tablet Take 1 tablet (2.5 mg total) by mouth daily. 09/02/13  Yes Dorothy Spark, MD  aspirin EC 81 MG tablet Take 81 mg by mouth daily.   Yes Historical Provider, MD  clopidogrel (PLAVIX) 75 MG tablet Take 1 tablet (75 mg total) by mouth daily. 09/02/13  Yes Dorothy Spark, MD  metoprolol (LOPRESSOR) 50 MG tablet Take 1 tablet (50 mg total) by mouth 2 (two) times daily. 09/02/13  Yes Dorothy Spark, MD  Omega-3 Fatty Acids (FISH OIL) 1000 MG CPDR Take 1,000 mg by mouth daily. 09/16/13  Yes Dorothy Spark, MD  pravastatin (PRAVACHOL) 10 MG tablet Take 1 tablet daily in the evening. 09/16/13  Yes Dorothy Spark, MD   BP 155/86  Pulse 66  Temp(Src) 97.6 F (36.4 C) (Oral)  Resp 16  SpO2 97% Physical Exam  Nursing note and vitals reviewed. Constitutional: She is oriented to person, place, and time. She appears well-developed and well-nourished. No distress.  HENT:  Head: Normocephalic and atraumatic.  Right Ear: Hearing, tympanic membrane, external ear and ear canal normal.  Left Ear: Hearing, tympanic membrane, external ear and ear canal normal.  Nose: Nose normal.  Mouth/Throat: Uvula is midline, oropharynx is clear and moist and mucous  membranes are normal.  Eyes: Conjunctivae are normal. Right eye exhibits no discharge. Left eye exhibits no discharge. No scleral icterus.  Neck: Normal range of motion. Neck supple. No thyromegaly present.  Cardiovascular: Normal rate, regular rhythm and normal heart sounds.   Pulmonary/Chest: Effort normal and breath sounds normal. No respiratory distress. She has no wheezes.  Abdominal: Soft. Bowel sounds are normal. She exhibits no distension. There is no tenderness.  Musculoskeletal: Normal range of motion.  Lymphadenopathy:    She has no cervical adenopathy.  Neurological: She is alert and oriented to person, place, and time.  Skin: Skin is warm and dry.  Psychiatric: She has a normal mood and affect. Her behavior is normal.    ED Course  Procedures (including critical care time) Labs Review Labs Reviewed - No data to display  Results for orders placed in visit on 07/23/13  VITAMIN B12      Result Value Ref Range   Vitamin B-12 335  211 - 911 pg/mL  TSH      Result Value Ref Range   TSH 2.35  0.35 - 5.50 uIU/mL  METHYLMALONIC ACID, SERUM      Result Value Ref Range   Methylmalonic Acid, Quant 0.14  <0.40 umol/L  VITAMIN E      Result Value Ref Range   Alpha-Tocopherol 16.1  5.7 - 19.9 mg/L   Gamma-Tocopherol (Vit E) 3.9  <=4.3 mg/L   Imaging Review No results found.   MDM   1. Allergic rhinitis    Exam unremarkable. Will treat with Pataday, Claritin, and Atrovent nasal spray and advise PCP follow up if no improvement.    [place], [place] 10/25/13 [phone]

## 2013-11-01 NOTE — ED Provider Notes (Signed)
Medical screening examination/treatment/procedure(s) were performed by resident physician or non-physician practitioner and as supervising physician I was immediately available for consultation/collaboration.   Pauline Good MD.   Billy Fischer, MD 11/01/13 1005

## 2013-12-18 ENCOUNTER — Telehealth: Payer: Self-pay | Admitting: Family Medicine

## 2013-12-23 ENCOUNTER — Other Ambulatory Visit: Payer: Medicare Other

## 2013-12-24 ENCOUNTER — Ambulatory Visit: Payer: Medicare Other | Admitting: Pharmacist

## 2014-01-01 NOTE — Telephone Encounter (Signed)
Left Message - Called to schedule IAWV. When pt returns call please schedule appt. for 60 minutes.

## 2014-03-15 ENCOUNTER — Encounter (HOSPITAL_COMMUNITY): Payer: Self-pay | Admitting: Emergency Medicine

## 2014-03-15 ENCOUNTER — Emergency Department (HOSPITAL_COMMUNITY)
Admission: EM | Admit: 2014-03-15 | Discharge: 2014-03-15 | Disposition: A | Discharge status: Home / Self Care | Payer: Medicare Other | Source: Home / Self Care | Attending: Family Medicine | Admitting: Family Medicine

## 2014-03-15 DIAGNOSIS — J069 Acute upper respiratory infection, unspecified: Secondary | ICD-10-CM

## 2014-03-15 LAB — POCT RAPID STREP A: STREPTOCOCCUS, GROUP A SCREEN (DIRECT): NEGATIVE

## 2014-03-15 MED ORDER — AZITHROMYCIN 250 MG PO TABS: ORAL_TABLET | ORAL | Status: DC

## 2014-03-15 NOTE — ED Notes (Signed)
Pt  Reports      Symptoms     Of  Body    Aches       sorethroat         Headache  Since   The  Middle       Of  Last  Week      -        Sitting  Upright  On exam  Table  In no  Severe  Distress           Pt  Reports  Had  Some  Chills  As  Well  Earlier

## 2014-03-15 NOTE — ED Provider Notes (Signed)
CSN: [id number]     Arrival date & time 03/15/14  1236 History   First MD Initiated Contact with Patient 03/15/14 1258     Chief Complaint  Patient presents with  . Sore Throat   (Consider location/radiation/quality/duration/timing/severity/associated sxs/prior Treatment) Patient is a 80 y.o. female presenting with pharyngitis. The history is provided by the patient.  Sore Throat This is a new problem. The current episode started more than 2 days ago. Pertinent negatives include no headaches. The symptoms are aggravated by swallowing.    Past Medical History  Diagnosis Date  . HTN (hypertension)   . Hyperlipidemia   . 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 CAD (coronary artery disease)     prior DES Jan 2007 with low risk myoview in 2008  . Back pain     with radiculopathy  . GERD (gastroesophageal reflux disease)   . Glaucoma suspect    Past Surgical History  Procedure Laterality Date  . Coronary stent placement  Jan 2007    DES to RCA  . Microdiscectomy      L4-L5  . Abdominal hysterectomy    . Back surgery     Family History  Problem Relation Age of Onset  . Stroke Father     Deceased, 47s  . Diabetes Brother   . Healthy Son    History  Substance Use Topics  . Smoking status: Former Smoker -- 1.00 packs/day for 15 years    Types: Cigarettes    Quit date: 08/25/1996  . Smokeless tobacco: Never Used  . Alcohol Use: No   OB History   Grav Para Term Preterm Abortions TAB SAB Ect Mult Living                 Review of Systems  Constitutional: Negative.  Negative for fever and chills.  HENT: Positive for congestion and rhinorrhea.   Respiratory: Negative for cough.   Cardiovascular: Negative.   Neurological: Negative for headaches.    Allergies  Lipitor; Oxycontin; and Penicillins  Home Medications   Prior to Admission medications   Medication Sig Start Date End Date Taking? Authorizing Provider   acetaminophen (TYLENOL) 650 MG CR tablet Take 650 mg by mouth every 8 (eight) hours as needed. For pain    Historical Provider, MD  amLODipine (NORVASC) 2.5 MG tablet Take 1 tablet (2.5 mg total) by mouth daily. 09/02/13   Dorothy Spark, MD  aspirin EC 81 MG tablet Take 81 mg by mouth daily.    Historical Provider, MD  azithromycin (ZITHROMAX Z-PAK) 250 MG tablet Take as directed on pack 03/15/14   Billy Fischer, MD  clopidogrel (PLAVIX) 75 MG tablet Take 1 tablet (75 mg total) by mouth daily. 09/02/13   Dorothy Spark, MD  ipratropium (ATROVENT) 0.06 % nasal spray Place 2 sprays into both nostrils 4 (four) times daily. 10/25/13   Audelia Hives Presson, PA  loratadine (CLARITIN) 10 MG tablet Take 1 tablet (10 mg total) by mouth daily. 10/25/13   Audelia Hives Presson, PA  metoprolol (LOPRESSOR) 50 MG tablet Take 1 tablet (50 mg total) by mouth 2 (two) times daily. 09/02/13   Dorothy Spark, MD  olopatadine (PATANOL) 0.1 % ophthalmic solution Place 1 drop into both eyes 2 (two) times daily. 10/25/13   Audelia Hives Presson, PA  Omega-3 Fatty Acids (FISH OIL) 1000 MG CPDR Take 1,000 mg by mouth daily. 09/16/13  Dorothy Spark, MD  pravastatin (PRAVACHOL) 10 MG tablet Take 1 tablet daily in the evening. 09/16/13   Dorothy Spark, MD   BP 116/67  Pulse 70  Temp(Src) 98.6 F (37 C) (Oral) Physical Exam  Nursing note and vitals reviewed. Constitutional: She is oriented to person, place, and time. She appears well-developed and well-nourished. No distress.  HENT:  Head: Normocephalic.  Right Ear: External ear normal.  Left Ear: External ear normal.  Nose: Nose normal.  Mouth/Throat: Uvula is midline and mucous membranes are normal. Posterior oropharyngeal erythema present. No oropharyngeal exudate or posterior oropharyngeal edema.  Neck: Normal range of motion. Neck supple.  Pulmonary/Chest: Effort normal and breath sounds normal.  Lymphadenopathy:    She has no cervical adenopathy.   Neurological: She is alert and oriented to person, place, and time.  Skin: Skin is warm and dry.    ED Course  Procedures (including critical care time) Labs Review Labs Reviewed  POCT RAPID STREP A ([facility] CARE ONLY)  strep neg.  Imaging Review No results found.   MDM   1. URI (upper respiratory infection)        Billy Fischer, MD 03/15/14 [phone]

## 2014-03-15 NOTE — Discharge Instructions (Signed)
Drink plenty of fluids as discussed, use medicine as prescribed, and mucinex or delsym for cough. Return or see your doctor if further problems °

## 2014-03-17 LAB — CULTURE, GROUP A STREP

## 2014-04-21 ENCOUNTER — Encounter: Payer: Self-pay | Admitting: Family Medicine

## 2014-04-21 ENCOUNTER — Telehealth: Payer: Self-pay | Admitting: Family Medicine

## 2014-04-21 ENCOUNTER — Other Ambulatory Visit: Payer: Self-pay | Admitting: Cardiology

## 2014-04-21 ENCOUNTER — Ambulatory Visit (INDEPENDENT_AMBULATORY_CARE_PROVIDER_SITE_OTHER): Payer: Medicare Other | Admitting: Family Medicine

## 2014-04-21 ENCOUNTER — Ambulatory Visit
Admission: RE | Admit: 2014-04-21 | Discharge: 2014-04-21 | Disposition: A | Discharge status: Home / Self Care | Payer: Medicare Other | Source: Ambulatory Visit | Attending: Family Medicine | Admitting: Family Medicine

## 2014-04-21 VITALS — BP 152/92 | HR 68 | Wt 138.0 lb

## 2014-04-21 DIAGNOSIS — M545 Low back pain, unspecified: Secondary | ICD-10-CM

## 2014-04-21 DIAGNOSIS — Z9889 Other specified postprocedural states: Secondary | ICD-10-CM

## 2014-04-21 DIAGNOSIS — Z23 Encounter for immunization: Secondary | ICD-10-CM

## 2014-04-21 NOTE — Patient Instructions (Signed)
Natalie [name] it was great to meet you today!  I am sorry that you are not feeling well Limit pain medications as you are able  Tylenol for mild-moderate pain relief   I will call with results of xray if anything looks abnormal   Please return to clinic if symptoms do not improve or worsen Feel better soon Bernadene Bell, MD

## 2014-04-21 NOTE — Progress Notes (Signed)
Patient ID: Natalie [name], female   DOB: Mar 10, 1934, 80 y.o.   MRN: [id number]   [facility] Bernadene Bell, MD Phone: [phone]  Subjective:  Natalie [name] is a 80 y.o F who presents to clinic with back pain  # back pain -has known herniated lumbar disk with radiculopathy -DG spine 07/25/2011 IMPRESSION:  Spondylosis, worst at L4-5 and L5-S1. -in last few weeks has been feeling much worse  -has been working in her garden quite a bit more  -no acute inciting event  -potential back surgery-- unclear of Doctor   -denies bowel or bladder incontinence  All relevant systems were reviewed and were negative unless otherwise noted in the HPI  Past Medical History Reviewed problem list.  Medications- reviewed and updated Current Outpatient Prescriptions  Medication Sig Dispense Refill  . acetaminophen (TYLENOL) 650 MG CR tablet Take 650 mg by mouth every 8 (eight) hours as needed. For pain      . amLODipine (NORVASC) 2.5 MG tablet Take 1 tablet (2.5 mg total) by mouth daily.  30 tablet  11  . aspirin EC 81 MG tablet Take 81 mg by mouth daily.      Marland Kitchen azithromycin (ZITHROMAX Z-PAK) 250 MG tablet Take as directed on pack  6 tablet  0  . clopidogrel (PLAVIX) 75 MG tablet Take 1 tablet (75 mg total) by mouth daily.  30 tablet  11  . ipratropium (ATROVENT) 0.06 % nasal spray Place 2 sprays into both nostrils 4 (four) times daily.  15 mL  1  . loratadine (CLARITIN) 10 MG tablet Take 1 tablet (10 mg total) by mouth daily.  30 tablet  1  . metoprolol (LOPRESSOR) 50 MG tablet Take 1 tablet (50 mg total) by mouth 2 (two) times daily.  60 tablet  11  . olopatadine (PATANOL) 0.1 % ophthalmic solution Place 1 drop into both eyes 2 (two) times daily.  5 mL  1  . Omega-3 Fatty Acids (FISH OIL) 1000 MG CPDR Take 1,000 mg by mouth daily.      . pravastatin (PRAVACHOL) 10 MG tablet Take 1 tablet daily in the evening.  30 tablet  6   No current facility-administered  medications for this visit.   Chief complaint-noted No additions to family history Social history- patient is a former smoker  Objective: BP 152/92  Pulse 68  Wt 138 lb (62.596 kg) Gen: NAD, alert, cooperative with exam Ext: No edema, warm, normal tone, moves UE/LE spontaneously but cautiously; Full ROM of legs; hip flexion limited to 100 degrees by hip discomfort; neg straight leg raise, neg FABER, neg stork test, full flexion and extension; no palpable step offs Neuro: Alert and oriented, No gross deficits, intact by testing Skin: no rashes; mid line spinal scar  Assessment/Plan: See problem based a/p

## 2014-04-21 NOTE — Assessment & Plan Note (Signed)
Chronic with acute worsening in last 3 weeks Likely msk strain given exam and history  However with pmh of spinal surgery will proceed with xray of this area No NSAIDs given asa and plavix use Tylenol prn Further eval per PCP

## 2014-04-21 NOTE — Telephone Encounter (Signed)
IMPRESSION:  Lumbar spine spondylosis most severe at L4-5 and L5-S1.  Would she like to see Spine doctor again? Or does she want physical therapy? Thanks [facility]

## 2014-04-22 ENCOUNTER — Other Ambulatory Visit: Payer: Self-pay | Admitting: Family Medicine

## 2014-04-22 MED ORDER — PRAVASTATIN SODIUM 10 MG PO TABS: ORAL_TABLET | ORAL | Status: DC

## 2014-04-22 NOTE — Telephone Encounter (Signed)
Pt states that she would like to discuss these 2 options with her pcp at her next visit.  Also asked about her refill her pravastatin.  Do you think you can send this in?  She is out of medication. Deyra Perdomo,CMA

## 2014-04-23 MED ORDER — PRAVASTATIN SODIUM 10 MG PO TABS: 10.0000 mg | ORAL_TABLET | Freq: Every day | ORAL | Status: DC

## 2014-04-23 NOTE — Telephone Encounter (Signed)
Done  Will defer further management to Dr. Raoul Pitch (pt patient's request) [facility], MD

## 2014-05-02 ENCOUNTER — Ambulatory Visit (INDEPENDENT_AMBULATORY_CARE_PROVIDER_SITE_OTHER): Payer: Medicare Other | Admitting: Family Medicine

## 2014-05-02 ENCOUNTER — Encounter: Payer: Self-pay | Admitting: Family Medicine

## 2014-05-02 VITALS — BP 156/83 | HR 72 | Temp 97.4°F | Ht 67.0 in | Wt 137.0 lb

## 2014-05-02 DIAGNOSIS — C189 Malignant neoplasm of colon, unspecified: Secondary | ICD-10-CM

## 2014-05-02 DIAGNOSIS — M545 Low back pain, unspecified: Secondary | ICD-10-CM

## 2014-05-02 MED ORDER — HYDROCODONE-ACETAMINOPHEN 5-325 MG PO TABS: 1.0000 | ORAL_TABLET | Freq: Four times a day (QID) | ORAL | Status: DC | PRN

## 2014-05-02 NOTE — Progress Notes (Signed)
   Subjective:    Patient ID: Natalie [name], female    DOB: 08-Jul-1934, 80 y.o.   MRN: [id number]  HPI Natalie [name] is 80 y.o. female presents to followup to back pain, and has many questions about other complaint  Back pain: Patient states She had complaints of lower lumbar back pain after gardening. She had no alarm symptoms at that time and has been treating herself with Tylenol. Her x-ray results showed worsening spondylosis. These results were discussed with her today. She states her back is feeling much better. She had an old prescription of Vicodin in which she needed to take one of, but other than that she controlled her pain with Tylenol. She was applying heat as encouraged, and this feels like it helped a great deal. She still feels a small nagging discomfort in her lower lumbar area it does not radiate. She states she would not want to have surgery again in this area. She states she still has right leg numbness from her prior surgery. She would not want to have any type of injections or be referred to pain management as well. She feels she can control it on her own with use of Tylenol, and rarely of Vicodin if needed. Her last Vicodin prescription was written to years ago and she still has 2 left.  Fecal incontinence: Patient states she that she's had recent fecal incontinence x1, bowel movement changes in weight loss.She states this is new for her. She does have a history of colon cancer in approximately 2002. Her most recent colonoscopy was in 2012 and should some polyps, and was recommended for 5 year followup. He is no family history of colon cancer. Per records it appears she's fluctuated approximately 5 pounds in the past year, however the patient states that she was as high as 150s and likes that weight in her. She has started supplementing and sure one time a day to help her gain weight. She denies any abdominal pain, hematochezia or melena. She does state her bowels  have become more loose over the past month or so. She states when she had her fecal incontinence she felt like she never had this feeling or urge to use the bathroom before it came out. She denies any fevers or night sweats.   Review of Systems Per history of present illness    Objective:   Physical Exam BP 156/83  Pulse 72  Temp(Src) 97.4 F (36.3 C) (Oral)  Ht 5\' 7"  (1.702 m)  Wt 137 lb (62.143 kg)  BMI 21.45 kg/m2 Gen: Pleasant, African American female, no acute distress, nontoxic in appearance, well-developed, well-nourished. HEENT: AT. [place]. Bilateral eyes without injections or icterus. MMM. CV: RRR, no murmur appreciated Chest: CTAB, no wheeze or crackles Abd: Soft. Flat. NTND. BS present. No Masses palpated.  Muscle skeletal: No erythema, no swelling, no bruising. Mild tissue texture change right paraspinal. No tenderness to palpation. Neurovascularly intact.        Assessment & Plan:

## 2014-05-02 NOTE — Patient Instructions (Addendum)
Back Pain, Adult Low back pain is very common. About 1 in 5 people have back pain.The cause of low back pain is rarely dangerous. The pain often gets better over time.About half of people with a sudden onset of back pain feel better in just 2 weeks. About 8 in 10 people feel better by 6 weeks.  CAUSES Some common causes of back pain include:  Strain of the muscles or ligaments supporting the spine.  Wear and tear (degeneration) of the spinal discs.  Arthritis.  Direct injury to the back. DIAGNOSIS Most of the time, the direct cause of low back pain is not known.However, back pain can be treated effectively even when the exact cause of the pain is unknown.Answering your caregiver's questions about your overall health and symptoms is one of the most accurate ways to make sure the cause of your pain is not dangerous. If your caregiver needs more information, he or she may order lab work or imaging tests (X-rays or MRIs).However, even if imaging tests show changes in your back, this usually does not require surgery. HOME CARE INSTRUCTIONS For many people, back pain returns.Since low back pain is rarely dangerous, it is often a condition that people can learn to manageon their own.   Remain active. It is stressful on the back to sit or stand in one place. Do not sit, drive, or stand in one place for more than 30 minutes at a time. Take short walks on level surfaces as soon as pain allows.Try to increase the length of time you walk each day.  Do not stay in bed.Resting more than 1 or 2 days can delay your recovery.  Do not avoid exercise or work.Your body is made to move.It is not dangerous to be active, even though your back may hurt.Your back will likely heal faster if you return to being active before your pain is gone.  Pay attention to your body when you bend and lift. Many people have less discomfortwhen lifting if they bend their knees, keep the load close to their bodies,and  avoid twisting. Often, the most comfortable positions are those that put less stress on your recovering back.  Find a comfortable position to sleep. Use a firm mattress and lie on your side with your knees slightly bent. If you lie on your back, put a pillow under your knees.  Only take over-the-counter or prescription medicines as directed by your caregiver. Over-the-counter medicines to reduce pain and inflammation are often the most helpful.Your caregiver may prescribe muscle relaxant drugs.These medicines help dull your pain so you can more quickly return to your normal activities and healthy exercise.  Put ice on the injured area.  Put ice in a plastic bag.  Place a towel between your skin and the bag.  Leave the ice on for 15-20 minutes, 03-04 times a day for the first 2 to 3 days. After that, ice and heat may be alternated to reduce pain and spasms.  Ask your caregiver about trying back exercises and gentle massage. This may be of some benefit.  Avoid feeling anxious or stressed.Stress increases muscle tension and can worsen back pain.It is important to recognize when you are anxious or stressed and learn ways to manage it.Exercise is a great option. SEEK MEDICAL CARE IF:  You have pain that is not relieved with rest or medicine.  You have pain that does not improve in 1 week.  You have new symptoms.  You are generally not feeling well. SEEK   IMMEDIATE MEDICAL CARE IF:   You have pain that radiates from your back into your legs.  You develop new bowel or bladder control problems.  You have unusual weakness or numbness in your arms or legs.  You develop nausea or vomiting.  You develop abdominal pain.  You feel faint. Document Released: 06/27/2005 Document Revised: 12/27/2011 Document Reviewed: 10/29/2013 [facility] Patient Information 2015 [place], [place]. This information is not intended to replace advice given to you by your health care provider. Make sure you  discuss any questions you have with your health care provider.  We have made an urgent referral to a gastroenterologist for you, they will be calling you with the appointment I have prescribed to #30 Vicodin, if you need additional refills we will need to decide a pain contract or be sent to pain management. Continue heat application when necessary. Please make an appointment within the next one to 2 months to have your yearly physical.

## 2014-05-02 NOTE — Assessment & Plan Note (Signed)
Pain much improved. Patient would not desire surgery or injections through pain management. Advised patient to continue taking Tylenol as needed, prescribed #30 Vicodin for use for severe pain if needed. Patient is aware this is a one-time prescription, and if she needs additional pain she will need to be referred to pain management are signed pain contract. Followup as needed

## 2014-05-02 NOTE — Assessment & Plan Note (Signed)
Patient history of colon cancer now with fecal incontinence, weight loss and bowel changes. Have placed her urgent referral with gastroenterology, she has an appointment coming up this Monday. Followup as needed

## 2014-07-08 ENCOUNTER — Other Ambulatory Visit: Payer: Self-pay | Admitting: Family Medicine

## 2014-07-08 MED ORDER — CLOPIDOGREL BISULFATE 75 MG PO TABS: 75.0000 mg | ORAL_TABLET | Freq: Every day | ORAL | Status: DC

## 2014-07-08 NOTE — Telephone Encounter (Signed)
LVM for patient to call back to inform her that Dr. Raoul Pitch has sent in rx

## 2014-07-08 NOTE — Telephone Encounter (Signed)
Pt called and needs a refill on her Plavix called in. jw

## 2014-09-04 ENCOUNTER — Other Ambulatory Visit: Payer: Self-pay | Admitting: Cardiology

## 2014-12-30 ENCOUNTER — Encounter: Payer: Self-pay | Admitting: Family Medicine

## 2014-12-30 ENCOUNTER — Ambulatory Visit (INDEPENDENT_AMBULATORY_CARE_PROVIDER_SITE_OTHER): Payer: Medicare Other | Admitting: Family Medicine

## 2014-12-30 ENCOUNTER — Other Ambulatory Visit: Payer: Self-pay | Admitting: Family Medicine

## 2014-12-30 VITALS — BP 156/84 | HR 93 | Temp 97.8°F | Ht 67.0 in | Wt 134.7 lb

## 2014-12-30 DIAGNOSIS — J019 Acute sinusitis, unspecified: Secondary | ICD-10-CM | POA: Insufficient documentation

## 2014-12-30 DIAGNOSIS — J0110 Acute frontal sinusitis, unspecified: Secondary | ICD-10-CM

## 2014-12-30 MED ORDER — LORATADINE 10 MG PO TABS: 10.0000 mg | ORAL_TABLET | Freq: Every day | ORAL | Status: DC

## 2014-12-30 MED ORDER — DOXYCYCLINE HYCLATE 100 MG PO TABS: 100.0000 mg | ORAL_TABLET | Freq: Two times a day (BID) | ORAL | Status: DC

## 2014-12-30 MED ORDER — FLUTICASONE PROPIONATE 50 MCG/ACT NA SUSP: 2.0000 | Freq: Every day | NASAL | Status: DC

## 2014-12-30 NOTE — Assessment & Plan Note (Signed)
Natalie [name] is a 81 y.o. American female, presents to family medicine clinic with signs and symptoms of acute maxillary sinusitis. - A she is allergic to penicillin, prescribe doxycycline 100 mg twice a day 10 days - Started Flonase, called in Claritin if she feels Flonase is not covering all of her allergies she can add Claritin to the regimen as well. - Encouraged fluids, rest, Tylenol for headaches. - She is to return to clinic in 1-2 weeks if no improvement, or in 4 weeks for a visit with new PCP for annual visit.

## 2014-12-30 NOTE — Patient Instructions (Signed)
Sinusitis Sinusitis is redness, soreness, and inflammation of the paranasal sinuses. Paranasal sinuses are air pockets within the bones of your face (beneath the eyes, the middle of the forehead, or above the eyes). In healthy paranasal sinuses, mucus is able to drain out, and air is able to circulate through them by way of your nose. However, when your paranasal sinuses are inflamed, mucus and air can become trapped. This can allow bacteria and other germs to grow and cause infection. Sinusitis can develop quickly and last only a short time (acute) or continue over a long period (chronic). Sinusitis that lasts for more than 12 weeks is considered chronic.  CAUSES  Causes of sinusitis include:  Allergies.  Structural abnormalities, such as displacement of the cartilage that separates your nostrils (deviated septum), which can decrease the air flow through your nose and sinuses and affect sinus drainage.  Functional abnormalities, such as when the small hairs (cilia) that line your sinuses and help remove mucus do not work properly or are not present. SIGNS AND SYMPTOMS  Symptoms of acute and chronic sinusitis are the same. The primary symptoms are pain and pressure around the affected sinuses. Other symptoms include:  Upper toothache.  Earache.  Headache.  Bad breath.  Decreased sense of smell and taste.  A cough, which worsens when you are lying flat.  Fatigue.  Fever.  Thick drainage from your nose, which often is green and may contain pus (purulent).  Swelling and warmth over the affected sinuses. DIAGNOSIS  Your health care provider will perform a physical exam. During the exam, your health care provider may:  Look in your nose for signs of abnormal growths in your nostrils (nasal polyps).  Tap over the affected sinus to check for signs of infection.  View the inside of your sinuses (endoscopy) using an imaging device that has a light attached (endoscope). If your health  care provider suspects that you have chronic sinusitis, one or more of the following tests may be recommended:  Allergy tests.  Nasal culture. A sample of mucus is taken from your nose, sent to a lab, and screened for bacteria.  Nasal cytology. A sample of mucus is taken from your nose and examined by your health care provider to determine if your sinusitis is related to an allergy. TREATMENT  Most cases of acute sinusitis are related to a viral infection and will resolve on their own within 10 days. Sometimes medicines are prescribed to help relieve symptoms (pain medicine, decongestants, nasal steroid sprays, or saline sprays).  However, for sinusitis related to a bacterial infection, your health care provider will prescribe antibiotic medicines. These are medicines that will help kill the bacteria causing the infection.  Rarely, sinusitis is caused by a fungal infection. In theses cases, your health care provider will prescribe antifungal medicine. For some cases of chronic sinusitis, surgery is needed. Generally, these are cases in which sinusitis recurs more than 3 times per year, despite other treatments. HOME CARE INSTRUCTIONS   Drink plenty of water. Water helps thin the mucus so your sinuses can drain more easily.  Use a humidifier.  Inhale steam 3 to 4 times a day (for example, sit in the bathroom with the shower running).  Apply a warm, moist washcloth to your face 3 to 4 times a day, or as directed by your health care provider.  Use saline nasal sprays to help moisten and clean your sinuses.  Take medicines only as directed by your health care provider.    If you were prescribed either an antibiotic or antifungal medicine, finish it all even if you start to feel better. SEEK IMMEDIATE MEDICAL CARE IF:  You have increasing pain or severe headaches.  You have nausea, vomiting, or drowsiness.  You have swelling around your face.  You have vision problems.  You have a stiff  neck.  You have difficulty breathing. MAKE SURE YOU:   Understand these instructions.  Will watch your condition.  Will get help right away if you are not doing well or get worse. Document Released: 06/27/2005 Document Revised: 11/11/2013 Document Reviewed: 07/12/2011 [facility] Patient Information 2015 [place], [place]. This information is not intended to replace advice given to you by your health care provider. Make sure you discuss any questions you have with your health care provider.    I Am starting you on Flonase nasal spray for you to use daily at least for the next 30 days. This is safe to take every day to help with allergies. I have also called in refills for your Claritin, this is an anti-histamine for you to take the help with your mold allergies. You do have a sinus infection today, I have called in an anti-biotic called doxycycline for you to take twice a day for 10 days. Make sure to stay well-hydrated, drinking plenty of fluids, and getting plenty of rest. You can take regular Tylenol if you have a headache. Please make certain to reschedule your annual physical with your new primary care physician in July.

## 2014-12-30 NOTE — Progress Notes (Signed)
Subjective:    Patient ID: Natalie [name], female    DOB: 07-29-1933, 81 y.o.   MRN: [id number]  HPI  Sinus pressure: She presents to family medicine clinic today with a greater than 10 day history of sinus pressure and pain. She reports that over the last week all of her symptoms are worsening. She is getting headaches over her forehead, eye pressure and maxillary sinus pain. Her appetite has decreased, she's feeling more fatigued, fevers, chills, nausea and rhinorrhea. She endorses mild hoarseness, but denies sore throat. She denies any sick contacts. She denies a cough. She reports she does have seasonal allergies, took Claritin last year that seemed to help. He certainly she's just been trying some Tylenol sinus without great benefit. She has also tried taking airborne daily. She has concerns that her home had a mold problem that kick this off, but she states that it has been taking care of since. She also thinks her air conditioner may also be playing a role in her symptoms. She is tolerating foods and fluids, without vomiting.  Patient is due for her annual physical, she states she will reschedule this for next month when she is feeling better.  Former smoker Past Medical History  Diagnosis Date  . HTN (hypertension)   . Hyperlipidemia   . 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 CAD (coronary artery disease)     prior DES Jan 2007 with low risk myoview in 2008  . Back pain     with radiculopathy  . GERD (gastroesophageal reflux disease)   . Glaucoma suspect   ] Allergies  Allergen Reactions  . Lipitor [Atorvastatin]     Muscle aches on 40 mg daily  . Oxycontin [Oxycodone Hcl] Nausea And Vomiting  . Penicillins Rash   Past Surgical History  Procedure Laterality Date  . Coronary stent placement  Jan 2007    DES to RCA  . Microdiscectomy      L4-L5  . Abdominal hysterectomy    . Back surgery      Review of  Systems Per history of present illness    Objective:   Physical Exam BP 156/84 mmHg  Pulse 93  Temp(Src) 97.8 F (36.6 C) (Oral)  Ht 5\' 7"  (1.702 m)  Wt 134 lb 11.2 oz (61.1 kg)  BMI 21.09 kg/m2 Gen: NAD. Nontoxic in appearance, appears fatigued, well-developed, well-nourished, African-American female, pleasant, cooperative with exam, mild hoarseness. HEENT: AT. [place]. Bilateral TM visualized shiny/fluid-filled, no erythema. Bilateral eyes without injections or icterus. MMM. Bilateral nares erythema, drainage and mild swelling. Throat without erythema or exudates. Cobblestoning present. No cough on exam. Neck: Supple, mild lymphadenopathy anterior cervical.  CV: RRR no murmurs appreciated Chest: CTAB, no wheeze or crackles; diminished lung sounds bilaterally. Abd: Soft. Round. NTND. BS present. No Masses palpated.  Skin: No rashes, purpura or petechiae.     Assessment & Plan:  Natalie [name] is a 81 y.o. American female, presents to family medicine clinic with signs and symptoms of acute maxillary sinusitis. - A she is allergic to penicillin, prescribe doxycycline 100 mg twice a day 10 days - Started Flonase, called in Claritin if she feels Flonase is not covering all of her allergies she can add Claritin to the regimen as well. - Encouraged fluids, rest, Tylenol for headaches. - She is to return to clinic in 1-2 weeks if no improvement, or in 4 weeks for a visit  with new PCP for annual visit.

## 2015-01-04 ENCOUNTER — Other Ambulatory Visit: Payer: Self-pay | Admitting: Cardiology

## 2015-01-05 ENCOUNTER — Other Ambulatory Visit: Payer: Self-pay

## 2015-01-05 MED ORDER — METOPROLOL TARTRATE 50 MG PO TABS: ORAL_TABLET | ORAL | Status: DC

## 2015-01-05 MED ORDER — AMLODIPINE BESYLATE 2.5 MG PO TABS: ORAL_TABLET | ORAL | Status: DC

## 2015-01-29 ENCOUNTER — Other Ambulatory Visit: Payer: Self-pay | Admitting: Cardiology

## 2015-02-12 ENCOUNTER — Encounter: Payer: Self-pay | Admitting: *Deleted

## 2015-02-23 ENCOUNTER — Encounter: Payer: Self-pay | Admitting: Cardiology

## 2015-02-23 ENCOUNTER — Ambulatory Visit (INDEPENDENT_AMBULATORY_CARE_PROVIDER_SITE_OTHER): Payer: Medicare Other | Admitting: Cardiology

## 2015-02-23 VITALS — BP 124/76 | HR 74 | Ht 67.0 in | Wt 136.0 lb

## 2015-02-23 DIAGNOSIS — I10 Essential (primary) hypertension: Secondary | ICD-10-CM | POA: Diagnosis not present

## 2015-02-23 DIAGNOSIS — Z7901 Long term (current) use of anticoagulants: Secondary | ICD-10-CM

## 2015-02-23 DIAGNOSIS — I2510 Atherosclerotic heart disease of native coronary artery without angina pectoris: Secondary | ICD-10-CM | POA: Diagnosis not present

## 2015-02-23 DIAGNOSIS — I2583 Coronary atherosclerosis due to lipid rich plaque: Secondary | ICD-10-CM

## 2015-02-23 DIAGNOSIS — E785 Hyperlipidemia, unspecified: Secondary | ICD-10-CM

## 2015-02-23 MED ORDER — RED YEAST RICE EXTRACT 600 MG PO TABS: 600.0000 mg | ORAL_TABLET | Freq: Every day | ORAL | Status: DC

## 2015-02-23 NOTE — Progress Notes (Signed)
Patient ID: Natalie [name], female   DOB: [birth year], 81 y.o.   MRN: [id number]    Patient Name: Natalie [name] Date of Encounter: 02/23/2015  Primary Care Provider:  Tawanna Sat, MD Primary Cardiologist:  Dorothy Spark  Problem List   Past Medical History  Diagnosis Date  . HTN (hypertension)   . Hyperlipidemia   . 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 CAD (coronary artery disease)     prior DES Jan 2007 with low risk myoview in 2008  . Back pain     with radiculopathy  . GERD (gastroesophageal reflux disease)   . Glaucoma suspect    Past Surgical History  Procedure Laterality Date  . Coronary stent placement  Jan 2007    DES to RCA  . Microdiscectomy      L4-L5  . Abdominal hysterectomy    . Back surgery     Allergies  Allergies  Allergen Reactions  . Claritin [Loratadine] Other (See Comments)    Headaches, Sinus pain, Swelling of the sinuses  . Lipitor [Atorvastatin]     Muscle aches on 40 mg daily  . Oxycontin [Oxycodone Hcl] Nausea And Vomiting  . Penicillins Rash   HPI  Ms. Natalie [name] is a former patient of Dr Natalie [name]. She has a history of known CAD with remote PCI to the RCA back in 2007. Her last stress Myoview was in February 2013 which demonstrated an ejection fraction of 78% with no ischemia.  She was evaluated in October 2014 for TIA that presented with arm heaviness and numbness lasting less than 24 hours. MRI/MRA showed no acute stroke but did reveal microvascular changes. Carotid dopplers showed 0-39% stenosis bilaterally. Echo with no cardio embolic source but did note grade I diastolic dysfunction. Troponins x 2 were also negative. Patient was on aspirin 81 mg and Plavix on admission and these were continued.   The patient was also started on pravastatin as she didn't tolerate lipitor in the past. She stopped pravastatin as well due to the muscle pain.  Today she reports  indigestion that occurs after certain foods and is always  as she had not been on a statin previously. Patient complains of DOE especially after walking stairs. Her BP has been elevated for quite some time.  02/23/15 - 1.5 years follow up, no chest pain, occasional SOB, not lately, she is going through a stressful period, taking care of her granddaughter who is bipolar. No claudications, palpitations, or syncope. No LE edema, PND. She stopped taking pravastatin as she didn't tolerate it and stopped taking fish oil. She is exercising, eating healthy.  Home Medications  Prior to Admission medications   Medication Sig Start Date End Date Taking? Authorizing Provider  acetaminophen (TYLENOL) 650 MG CR tablet Take 650 mg by mouth every 8 (eight) hours as needed. For pain   Yes Historical Provider, MD  aspirin EC 81 MG tablet Take 81 mg by mouth daily.   Yes Historical Provider, MD  clopidogrel (PLAVIX) 75 MG tablet Take 75 mg by mouth daily.   Yes Historical Provider, MD  metoprolol (LOPRESSOR) 50 MG tablet Take 1 tablet (50 mg total) by mouth 2 (two) times daily. 06/18/13  Yes Dorothy Spark, MD   Family History  Family History  Problem Relation Age of Onset  . Stroke Father     Deceased, 64s  . Diabetes Brother   . Healthy Son  Social History  Social History   Social History  . Marital Status: Married    Spouse Name: N/A  . Number of Children: N/A  . Years of Education: N/A   Occupational History  . Not on file.   Social History Main Topics  . Smoking status: Former Smoker -- 1.00 packs/day for 15 years    Types: Cigarettes    Quit date: 08/25/1996  . Smokeless tobacco: Never Used  . Alcohol Use: No  . Drug Use: No  . Sexual Activity: Not on file   Other Topics Concern  . Not on file   Social History Narrative   She lives with husband, daughter, and grand daughter.   She is a retired Consulting civil engineer.    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     Review of Systems, as per HPI, otherwise negative General:  No chills, fever, night sweats or weight changes.  Cardiovascular:  No chest pain, dyspnea on exertion, edema, orthopnea, palpitations, paroxysmal nocturnal dyspnea. Dermatological: No rash, lesions/masses Respiratory: No cough, dyspnea Urologic: No hematuria, dysuria Abdominal:   No nausea, vomiting, diarrhea, bright red blood per rectum, melena, or hematemesis Neurologic:  No visual changes, wkns, changes in mental status. All other systems reviewed and are otherwise negative except as noted above.  Physical Exam  Blood pressure 124/76, pulse 74, height 5\' 7"  (1.702 m), weight 136 lb (61.689 kg).  General: Pleasant, NAD Psych: Normal affect. Neuro: Alert and oriented X 3. Moves all extremities spontaneously. HEENT: Normal  Neck: Supple without bruits or JVD. Lungs:  Resp regular and unlabored, CTA. Heart: RRR no s3, s4, or murmurs. Abdomen: Soft, non-tender, non-distended, BS + x 4.  Extremities: No clubbing, cyanosis or edema. DP/PT weak B/L /Radials 2+ and equal bilaterally.  Labs:  No results for input(s): CKTOTAL, CKMB, TROPONINI in the last 72 hours. Lab Results  Component Value Date   WBC 4.8 05/08/2013   HGB 10.8* 05/08/2013   HCT 32.6* 05/08/2013   MCV 88.3 05/08/2013   PLT 450* 05/08/2013   No results for input(s): NA, K, CL, CO2, BUN, CREATININE, CALCIUM, PROT, BILITOT, ALKPHOS, ALT, AST, GLUCOSE in the last 168 hours.  Invalid input(s): LABALBU Lab Results  Component Value Date   CHOL 220* 05/08/2013   HDL 54 05/08/2013   LDLCALC 133* 05/08/2013   TRIG 165* 05/08/2013    US Carotids: 05/08/2013 - The vertebral arteries appear patent with antegrade flow. - Findings consistent with 1-39 percent stenosis involving the right internal carotid artery and the left internal carotid artery. - ICA/CCA ratio. right =1.4. left = 1.39 Other specific details can  be found in the table(s) above. Prepared and Electronically Authenticated by  Echocardiogram 06/09/2013  - Left ventricle: The cavity size was normal. Wall thickness was increased in a pattern of mild LVH. Systolic function was normal. The estimated ejection fraction was in the range of 55% to 60%. Wall motion was normal; there were no regional wall motion abnormalities. Doppler parameters are consistent with abnormal left ventricular relaxation (grade 1 diastolic dysfunction). - Mitral valve: Calcified annulus. - Atrial septum: No defect or patent foramen ovale was identified. Impressions: - No cardiac source of emboli was indentified.  Accessory Clinical Findings  ECG - SR, LVH, non-specific ST-T wave abnormalities    Assessment & Plan  81 year old patient   1. CAD, s/p PCI to RCA, currently no chest pain, DOE appears as she is  deconditioned, normal stress test in 2015, preserved LV EF, o ECG changes, she is asymptomatic.   2. Hypertension - controlled.  3. Hyperlipidemia - concerning especially with h/o CAD and TIA, intolerant to statins, start red yeast rice.  4. Indigestion, GERD - seems that her chest pains are related to GERD, but refused to take nexium as Tums work  5. Claudications - normal B/L lower extremity Duplex  6. TIA, concern about possible Alzheimer disease - we will refer to Neurology  Follow up in 1 year, check lipids, CMP, CBC, TSH.  Dorothy Spark, MD, [facility] 02/23/2015, 3:44 PM

## 2015-02-23 NOTE — Patient Instructions (Signed)
Medication Instructions:  Start taking Red Yeast Rice 600 mg daily.   Labwork: On 02/26/15 anytime between 7:30 and 5 pm. Please do not eat or drink after midnight the night before labs are drawn.  Testing/Procedures: None  Follow-Up: Your physician wants you to follow-up in: 1 year. You will receive a reminder letter in the mail two months in advance. If you don't receive a letter, please call our office to schedule the follow-up appointment.

## 2015-02-26 ENCOUNTER — Other Ambulatory Visit (INDEPENDENT_AMBULATORY_CARE_PROVIDER_SITE_OTHER): Payer: Medicare Other | Admitting: *Deleted

## 2015-02-26 DIAGNOSIS — E785 Hyperlipidemia, unspecified: Secondary | ICD-10-CM

## 2015-02-26 DIAGNOSIS — Z7901 Long term (current) use of anticoagulants: Secondary | ICD-10-CM | POA: Diagnosis not present

## 2015-02-26 DIAGNOSIS — I10 Essential (primary) hypertension: Secondary | ICD-10-CM

## 2015-02-26 LAB — CBC WITH DIFFERENTIAL/PLATELET
Basophils Absolute: 0 10*3/uL (ref 0.0–0.1)
Basophils Relative: 0.4 % (ref 0.0–3.0)
Eosinophils Absolute: 0.1 10*3/uL (ref 0.0–0.7)
Eosinophils Relative: 1.2 % (ref 0.0–5.0)
HCT: 35.7 % — ABNORMAL LOW (ref 36.0–46.0)
Hemoglobin: 11.9 g/dL — ABNORMAL LOW (ref 12.0–15.0)
Lymphocytes Relative: 18.7 % (ref 12.0–46.0)
Lymphs Abs: 1.4 10*3/uL (ref 0.7–4.0)
MCHC: 33.2 g/dL (ref 30.0–36.0)
MCV: 90 fl (ref 78.0–100.0)
Monocytes Absolute: 0.5 10*3/uL (ref 0.1–1.0)
Monocytes Relative: 6.3 % (ref 3.0–12.0)
Neutro Abs: 5.4 10*3/uL (ref 1.4–7.7)
Neutrophils Relative %: 73.4 % (ref 43.0–77.0)
Platelets: 603 10*3/uL — ABNORMAL HIGH (ref 150.0–400.0)
RBC: 3.97 Mil/uL (ref 3.87–5.11)
RDW: 14.4 % (ref 11.5–15.5)
WBC: 7.4 10*3/uL (ref 4.0–10.5)

## 2015-02-26 LAB — LIPID PANEL
Cholesterol: 252 mg/dL — ABNORMAL HIGH (ref 0–200)
HDL: 55.9 mg/dL (ref >39.00)
LDL Cholesterol: 164 mg/dL — ABNORMAL HIGH (ref 0–99)
NonHDL: 196.25
Total CHOL/HDL Ratio: 5
Triglycerides: 162 mg/dL — ABNORMAL HIGH (ref 0.0–149.0)
VLDL: 32.4 mg/dL (ref 0.0–40.0)

## 2015-02-26 LAB — BASIC METABOLIC PANEL
BUN: 14 mg/dL (ref 6–23)
CO2: 32 mEq/L (ref 19–32)
Calcium: 9.5 mg/dL (ref 8.4–10.5)
Chloride: 105 mEq/L (ref 96–112)
Creatinine, Ser: 0.76 mg/dL (ref 0.40–1.20)
GFR: 94.29 mL/min (ref >60.00)
Glucose, Bld: 106 mg/dL — ABNORMAL HIGH (ref 70–99)
Potassium: 4.5 mEq/L (ref 3.5–5.1)
Sodium: 141 mEq/L (ref 135–145)

## 2015-02-26 LAB — HEPATIC FUNCTION PANEL
ALT: 12 U/L (ref 0–35)
AST: 17 U/L (ref 0–37)
Albumin: 4.2 g/dL (ref 3.5–5.2)
Alkaline Phosphatase: 45 U/L (ref 39–117)
Bilirubin, Direct: 0.1 mg/dL (ref 0.0–0.3)
Total Bilirubin: 0.4 mg/dL (ref 0.2–1.2)
Total Protein: 7.7 g/dL (ref 6.0–8.3)

## 2015-03-05 ENCOUNTER — Ambulatory Visit (INDEPENDENT_AMBULATORY_CARE_PROVIDER_SITE_OTHER): Payer: Medicare Other | Admitting: Pharmacist

## 2015-03-05 ENCOUNTER — Other Ambulatory Visit: Payer: Self-pay | Admitting: Cardiology

## 2015-03-05 VITALS — Ht 67.0 in | Wt 134.2 lb

## 2015-03-05 DIAGNOSIS — E785 Hyperlipidemia, unspecified: Secondary | ICD-10-CM

## 2015-03-05 MED ORDER — ROSUVASTATIN CALCIUM 5 MG PO TABS: 5.0000 mg | ORAL_TABLET | Freq: Every day | ORAL | Status: DC

## 2015-03-05 NOTE — Patient Instructions (Signed)
Start Crestor 5mg  once a week.  If you have any problems at all, please call Gay Filler at [phone].  If you are able to take the Crestor, we will check your labs again in 4 months.

## 2015-03-05 NOTE — Progress Notes (Signed)
Patient is a pleasant 81 y.o. AAF referred to lipid clinic by Dr. Meda Coffee given h/o CAD and recent TIA (04/2013) and failed 2 statins in the past.  Her LDL was 133 in 04/2013 off lipid lowering therapy.  She failed lipitor 40 mg and pravastatin 80 mg due to joint/muscle pain.  She was started on pravastatin 10mg  in March 2015 but stated it also caused malgias.  She stated it took a couple of weeks of therapy before symptoms started on statins and would go away within a week of stopping therapy.    Patient is apprehensive about taking lipid lowering therapy, as she is fearful of statins.    RF:  CAD (PCI 2007), TIA (04/2013), HTN, age - LDL goal < 70, non-HDL goal < 100 Meds:  Not on lipid lowering therapy. Intolerant:  lipitor 40 mg (joint pain), pravastatin 80 mg (joint pain), pravastatin 10mg  (joint pain)  Family history:  Father-CVA in his late 60s/early 109s Social history:  Denies alcohol use.  Denies tobacco use. Diet:  She will have a small breakfast of fruit, toast, milk or juice.  Occassional egg and sausage.   Lunch is a sandwich and chips.  Dinner is chicken/fish, with vegetable and starch.  Rarely eats red meat.  Lucendia Herrlich food only once weekly.  Doesn't drink soda or tea.  Drinks lots of water.  Does eat chocolate often in the evening for a dessert. Exercise:  She keeps busy with gardening and housework.   Labs:   02/2015: TC 252, LDL 164, HDL 56, TG 162 (not on lipid lowering therapy) 04/2013:  TC 220, LDL 133, HDL 54, TG 165 (not on lipid lowering meds)  Current Outpatient Prescriptions  Medication Sig Dispense Refill  . acetaminophen (TYLENOL) 650 MG CR tablet Take 650 mg by mouth every 8 (eight) hours as needed. For pain    . amLODipine (NORVASC) 2.5 MG tablet TAKE 1 TABLET (2.5 MG TOTAL) BY MOUTH DAILY. 30 tablet 0  . aspirin EC 81 MG tablet Take 81 mg by mouth daily.    . clopidogrel (PLAVIX) 75 MG tablet Take 1 tablet (75 mg total) by mouth daily. 30 tablet 11  . metoprolol  (LOPRESSOR) 50 MG tablet TAKE 1 TABLET (50 MG TOTAL) BY MOUTH 2 (TWO) TIMES DAILY. 60 tablet 0  . rosuvastatin (CRESTOR) 5 MG tablet Take 1 tablet (5 mg total) by mouth daily. Or as directed 30 tablet 0   No current facility-administered medications for this visit.   Allergies  Allergen Reactions  . Claritin [Loratadine] Other (See Comments)    Headaches, Sinus pain, Swelling of the sinuses  . Lipitor [Atorvastatin]     Muscle aches on 40 mg daily  . Oxycontin [Oxycodone Hcl] Nausea And Vomiting  . Penicillins Rash   Family History  Problem Relation Age of Onset  . Stroke Father     Deceased, 48s  . Diabetes Brother   . Healthy Son    Assessment and Plan 1.  Hyperlipidemia- Pt's LDL has increased over the past 2 years.  She has failed 2 statins.  Unsure cause of myalgias as pt also mentions similar reaction to other medications such as vitamin D.  Discussed potential options including once weekly Crestor and Zetia.  She will not qualify for PCSK-9 until she has failed both of these therapies.  She is agreeable to try once weekly Crestor.  Will start at 5mg .  If she has any problems, will discuss Zetia at that time.  If she  is able to tolerate Crestor, will recheck labs in 4 months.

## 2015-03-09 ENCOUNTER — Other Ambulatory Visit: Payer: Self-pay | Admitting: Cardiology

## 2015-04-20 ENCOUNTER — Encounter (INDEPENDENT_AMBULATORY_CARE_PROVIDER_SITE_OTHER): Payer: Medicare Other | Admitting: Ophthalmology

## 2015-04-20 DIAGNOSIS — H35033 Hypertensive retinopathy, bilateral: Secondary | ICD-10-CM | POA: Diagnosis not present

## 2015-04-20 DIAGNOSIS — H43813 Vitreous degeneration, bilateral: Secondary | ICD-10-CM

## 2015-04-20 DIAGNOSIS — I10 Essential (primary) hypertension: Secondary | ICD-10-CM

## 2015-04-20 DIAGNOSIS — H35373 Puckering of macula, bilateral: Secondary | ICD-10-CM | POA: Diagnosis not present

## 2015-04-20 DIAGNOSIS — H33001 Unspecified retinal detachment with retinal break, right eye: Secondary | ICD-10-CM

## 2015-05-04 ENCOUNTER — Ambulatory Visit (INDEPENDENT_AMBULATORY_CARE_PROVIDER_SITE_OTHER): Payer: Medicare Other | Admitting: Ophthalmology

## 2015-05-04 DIAGNOSIS — H33302 Unspecified retinal break, left eye: Secondary | ICD-10-CM | POA: Diagnosis not present

## 2015-05-18 ENCOUNTER — Ambulatory Visit (INDEPENDENT_AMBULATORY_CARE_PROVIDER_SITE_OTHER): Payer: Medicare Other | Admitting: Ophthalmology

## 2015-05-18 DIAGNOSIS — H33303 Unspecified retinal break, bilateral: Secondary | ICD-10-CM

## 2015-06-15 ENCOUNTER — Ambulatory Visit (INDEPENDENT_AMBULATORY_CARE_PROVIDER_SITE_OTHER): Payer: Medicare Other | Admitting: *Deleted

## 2015-06-15 ENCOUNTER — Ambulatory Visit: Payer: Medicare Other

## 2015-06-15 DIAGNOSIS — Z23 Encounter for immunization: Secondary | ICD-10-CM | POA: Diagnosis not present

## 2015-06-18 ENCOUNTER — Ambulatory Visit (INDEPENDENT_AMBULATORY_CARE_PROVIDER_SITE_OTHER): Payer: Medicare Other | Admitting: Student

## 2015-06-18 VITALS — BP 150/69 | HR 69 | Temp 97.9°F | Wt 135.7 lb

## 2015-06-18 DIAGNOSIS — M25519 Pain in unspecified shoulder: Secondary | ICD-10-CM

## 2015-06-18 DIAGNOSIS — M542 Cervicalgia: Secondary | ICD-10-CM | POA: Diagnosis not present

## 2015-06-18 NOTE — Patient Instructions (Signed)
Follow up 2 weeks with PCP for neck muscle strain Try heating pad and tylenol as needed for neck and shoulder strain If you have questions or concerns, call the office at [phone]

## 2015-06-19 DIAGNOSIS — M542 Cervicalgia: Principal | ICD-10-CM

## 2015-06-19 DIAGNOSIS — M25519 Pain in unspecified shoulder: Secondary | ICD-10-CM | POA: Insufficient documentation

## 2015-06-19 NOTE — Assessment & Plan Note (Signed)
Neck and shoulder pain after flexing her neck without signs or symptoms of bony or neurologic damage.pain likely musculoskeletal in nature - Will treat with tylenol and heating pad as needed for now - follow up in 2 weeks to re-assess - if continued or new symptoms, consider c spine XR

## 2015-06-19 NOTE — Progress Notes (Signed)
Subjective:    Patient ID: Natalie [name], female    DOB: 1934/04/18, 81 y.o.   MRN: [id number]   CC: neck pain  HPI 81 y/o with neck pain that started last night after leaning down to pick up books  Neck pain - last night 12/7, she was cleaning her home and went to pick up some books from the floor and bent her neck, when she stood up he felt a "pop" in her neck then soreness - as she stood she felt transiently dizzy, so she sat down and the dizziness resolved - She had no weakness, radiation of the pain down her arms or back - she then placed a heating pad on her neck. This helped and she went to sleep - This AM she continues to have pain in her neck and shoulders, no headache, weakness, radiation of the pain, numbness or tingling - She has a significant history of lumbar disk herniation with radiculopathy and her current neck symptoms feel different from her lower back symptoms  Otherwise denies recent illness, fever/chills, N/V/D, chest pain or SOB   Review of Systems   See HPI for ROS.   Past Medical History  Diagnosis Date  . HTN (hypertension)   . Hyperlipidemia   . 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 CAD (coronary artery disease)     prior DES Jan 2007 with low risk myoview in 2008  . Back pain     with radiculopathy  . GERD (gastroesophageal reflux disease)   . Glaucoma suspect    Past Surgical History  Procedure Laterality Date  . Coronary stent placement  Jan 2007    DES to RCA  . Microdiscectomy      L4-L5  . Abdominal hysterectomy    . Back surgery     OB History    No data available     Social History   Social History  . Marital Status: Married    Spouse Name: N/A  . Number of Children: N/A  . Years of Education: N/A   Occupational History  . Not on file.   Social History Main Topics  . Smoking status: Former Smoker -- 1.00 packs/day for 15 years    Types: Cigarettes    Quit  date: 08/25/1996  . Smokeless tobacco: Never Used  . Alcohol Use: No  . Drug Use: No  . Sexual Activity: Not on file   Other Topics Concern  . Not on file   Social History Narrative   She lives with husband, daughter, and grand daughter.   She is a retired Consulting civil engineer.    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    Objective:  BP 150/69 mmHg  Pulse 69  Temp(Src) 97.9 F (36.6 C) (Oral)  Wt 135 lb 11.2 oz (61.553 kg) Vitals and nursing note reviewed  General: NAD Cardiac: RRR,  Respiratory: CTAB, normal effort MSK- no tenderness to cervical or thoracic spine, mild tenderness to cervical paraspinous muscles, neg Spurling's test,  Skin: warm and dry, no rashes noted Neuro:  II - Visual field intact OU. III, IV, VI - Extraocular movements intact. V - Facial sensation intact bilaterally. VII - Facial movement intact bilaterally. VIII - Hearing & vestibular intact bilaterally. X - Palate elevates symmetrically, no dysarthria. XI - Chin turning & shoulder shrug intact bilaterally. XII - Tongue protrusion intact.  Motor Strength - The patient's strength was 5/5 in all extremities. Bulk was normal and fasciculations were absent.      Assessment & Plan:    Neck and shoulder pain Neck and shoulder pain after flexing her neck without signs or symptoms of bony or neurologic damage.pain likely musculoskeletal in nature - Will treat with tylenol and heating pad as needed for now - follow up in 2 weeks to re-assess - if continued or new symptoms, consider c spine XR     Summer Parthasarathy A. Lincoln Brigham MD, [facility] Resident PGY-2 Pager [phone]

## 2015-07-07 ENCOUNTER — Other Ambulatory Visit: Payer: Self-pay | Admitting: *Deleted

## 2015-07-07 MED ORDER — CLOPIDOGREL BISULFATE 75 MG PO TABS: 75.0000 mg | ORAL_TABLET | Freq: Every day | ORAL | Status: DC

## 2015-08-05 ENCOUNTER — Ambulatory Visit: Payer: Medicare Other | Admitting: Family Medicine

## 2015-09-16 ENCOUNTER — Ambulatory Visit (INDEPENDENT_AMBULATORY_CARE_PROVIDER_SITE_OTHER): Payer: Medicare Other | Admitting: Ophthalmology

## 2015-10-02 ENCOUNTER — Ambulatory Visit (INDEPENDENT_AMBULATORY_CARE_PROVIDER_SITE_OTHER): Payer: Medicare Other | Admitting: Cardiology

## 2015-10-02 ENCOUNTER — Encounter: Payer: Self-pay | Admitting: Cardiology

## 2015-10-02 VITALS — BP 132/76 | HR 77 | Ht 67.0 in | Wt 137.0 lb

## 2015-10-02 DIAGNOSIS — R079 Chest pain, unspecified: Secondary | ICD-10-CM

## 2015-10-02 MED ORDER — NITROGLYCERIN 0.4 MG SL SUBL: 0.4000 mg | SUBLINGUAL_TABLET | SUBLINGUAL | Status: AC | PRN

## 2015-10-02 MED ORDER — SULFAMETHOXAZOLE-TRIMETHOPRIM 800-160 MG PO TABS: 1.0000 | ORAL_TABLET | Freq: Every day | ORAL | Status: DC

## 2015-10-02 NOTE — Progress Notes (Signed)
[name] ID: Natalie [name], female   DOB: 1934-02-06, 82 y.o.   MRN: [id number]    [name] Name: Natalie [name] Date of Encounter: 10/02/2015  Primary Care Provider:  Tawanna Sat, MD Primary Cardiologist:  Dorothy Spark  Problem List   Past Medical History  Diagnosis Date  . HTN (hypertension)   . Hyperlipidemia   . Colon cancer ([place])     prior adenocarcinoma of cecum in 2000 s/p chemo and colon resection  . CAD (coronary artery disease)     prior DES Jan 2007 with low risk myoview in 2008  . Back pain     with radiculopathy  . GERD (gastroesophageal reflux disease)   . Glaucoma suspect    Past Surgical History  Procedure Laterality Date  . Coronary stent placement  Jan 2007    DES to RCA  . Microdiscectomy      L4-L5  . Abdominal hysterectomy    . Back surgery     Allergies  Allergies  Allergen Reactions  . Claritin [Loratadine] Other (See Comments)    Headaches, Sinus pain, Swelling of the sinuses  . Crestor [Rosuvastatin Calcium] Other (See Comments)    Muscle aches and pains   . Lipitor [Atorvastatin]     Muscle aches on 40 mg daily  . Oxycontin [Oxycodone Hcl] Nausea And Vomiting  . Penicillins Rash   HPI  Natalie [name] of Dr Verl Blalock. She has a history of known CAD with remote PCI to the RCA back in 2007. Her last stress Myoview was in February 2013 which demonstrated an ejection fraction of 78% with no ischemia.  She was evaluated in October 2014 for TIA that presented with arm heaviness and numbness lasting less than 24 hours. MRI/MRA showed no acute stroke but did reveal microvascular changes. Carotid dopplers showed 0-39% stenosis bilaterally. Echo with no cardio embolic source but did note grade I diastolic dysfunction. Troponins x 2 were also negative. [name] was on aspirin 81 mg and Plavix on admission and these were continued.   The [name] was also started on pravastatin as she didn't tolerate lipitor in the  past. She stopped pravastatin as well due to the muscle pain.  Today she reports indigestion that occurs after certain foods and is always  as she had not been on a statin previously. [name] complains of DOE especially after walking stairs. Her BP has been elevated for quite some time.  10/02/2015 - this is 6 months follow-up the [name] is complaining that for the last 4 weeks she has been experiencing more dyspnea on exertion associated with chest tightness that resolves at rest. No palpitations or syncope no lower extremity edema orthopnea or paroxysmal nocturnal dyspnea.  The [name] is compliant with her medications however she's not taking Crestor that was advised by lipid clinic because of muscle pain. She is also complaining of foul smelling urine and burning urination.  Home Medications  Prior to Admission medications   Medication Sig Start Date End Date Taking? Authorizing Provider  acetaminophen (TYLENOL) 650 MG CR tablet Take 650 mg by mouth every 8 (eight) hours as needed. For pain   Yes Historical Provider, MD  aspirin EC 81 MG tablet Take 81 mg by mouth daily.   Yes Historical Provider, MD  clopidogrel (PLAVIX) 75 MG tablet Take 75 mg by mouth daily.   Yes Historical Provider, MD  metoprolol (LOPRESSOR) 50 MG tablet Take 1 tablet (50 mg total) by mouth 2 (two) times daily.  06/18/13  Yes Dorothy Spark, MD   Family History  Family History  Problem Relation Age of Onset  . Stroke Father     Deceased, 74s  . Diabetes Brother   . Healthy Son    Social History  Social History   Social History  . Marital Status: Married    Spouse Name: N/A  . Number of Children: N/A  . Years of Education: N/A   Occupational History  . Not on file.   Social History Main Topics  . Smoking status: Former Smoker -- 1.00 packs/day for 15 years    Types: Cigarettes    Quit date: 08/25/1996  . Smokeless tobacco: Never Used  . Alcohol Use: No  . Drug Use: No  . Sexual Activity: Not on  file   Other Topics Concern  . Not on file   Social History Narrative   She lives with husband, daughter, and grand daughter.   She is a retired Consulting civil engineer.    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     Review of Systems, as per HPI, otherwise negative General:  No chills, fever, night sweats or weight changes.  Cardiovascular:  No chest pain, dyspnea on exertion, edema, orthopnea, palpitations, paroxysmal nocturnal dyspnea. Dermatological: No rash, lesions/masses Respiratory: No cough, dyspnea Urologic: No hematuria, dysuria Abdominal:   No nausea, vomiting, diarrhea, bright red blood per rectum, melena, or hematemesis Neurologic:  No visual changes, wkns, changes in mental status. All other systems reviewed and are otherwise negative except as noted above.  Physical Exam  Blood pressure 132/76, pulse 77, height 5\' 7"  (1.702 m), weight 137 lb (62.143 kg).  General: Pleasant, NAD Psych: Normal affect. Neuro: Alert and oriented X 3. Moves all extremities spontaneously. HEENT: Normal  Neck: Supple without bruits or JVD. Lungs:  Resp regular and unlabored, CTA. Heart: RRR no s3, s4, or murmurs. Abdomen: Soft, non-tender, non-distended, BS + x 4.  Extremities: No clubbing, cyanosis or edema. DP/PT weak B/L /Radials 2+ and equal bilaterally.  Labs:  No results for input(s): CKTOTAL, CKMB, TROPONINI in the last 72 hours. Lab Results  Component Value Date   WBC 7.4 02/26/2015   HGB 11.9* 02/26/2015   HCT 35.7* 02/26/2015   MCV 90.0 02/26/2015   PLT 603.0* 02/26/2015   No results for input(s): NA, K, CL, CO2, BUN, CREATININE, CALCIUM, PROT, BILITOT, ALKPHOS, ALT, AST, GLUCOSE in the last 168 hours.  Invalid input(s): LABALBU Lab Results  Component Value Date   CHOL 252* 02/26/2015   HDL 55.90 02/26/2015   LDLCALC 164* 02/26/2015   TRIG 162.0* 02/26/2015    US Carotids: 05/08/2013 - The vertebral arteries appear  patent with antegrade flow. - Findings consistent with 1-39 percent stenosis involving the right internal carotid artery and the left internal carotid artery. - ICA/CCA ratio. right =1.4. left = 1.39 Other specific details can be found in the table(s) above. Prepared and Electronically Authenticated by  Echocardiogram 06/09/2013  - Left ventricle: The cavity size was normal. Wall thickness was increased in a pattern of mild LVH. Systolic function was normal. The estimated ejection fraction was in the range of 55% to 60%. Wall motion was normal; there were no regional wall motion abnormalities. Doppler parameters are consistent with abnormal left ventricular relaxation (grade 1 diastolic dysfunction). - Mitral valve: Calcified annulus. - Atrial septum: No defect or patent foramen ovale was identified. Impressions: - No cardiac source of emboli was  indentified.  Accessory Clinical Findings  ECG - SR, LVH, non-specific ST-T wave abnormalities    Assessment & Plan  82 year old [name]   1. CAD, s/p PCI to RCA, she has worsening exertional chest pain and shortness of breath, we will schedule an exercise nuclear stress test.  2. Hypertension - controlled.  3. Hyperlipidemia - concerning especially with h/o CAD and TIA, intolerant to statins, she is now intolerant to 3 statins including pravastatin atorvastatin and rosuvastatin. We will send her back to the lipid clinic as now she would qualify for PCSK 9 inhibitors.  4. UTI - start Bactrim DS twice a day for 5 days.  5. Claudications - normal B/L lower extremity Duplex  Follow up in 2 months.  Dorothy Spark, MD, [facility] 10/02/2015, 12:05 PM

## 2015-10-02 NOTE — Patient Instructions (Addendum)
Medication Instructions:   BACTRIM DS  800/60 MG  1 TAB DAILY FOR 5  DAYS  Labwork:  NONE Testing/Procedures: Your physician has requested that you have en exercise stress myoview. For further information please visit [email or web address]. Please follow instruction sheet, as given.   Follow-Up: Your physician recommends that you schedule a follow-up appointment in:  2 MONTHS   WITH  DR Meda Coffee  Any Other Special Instructions Will Be Listed Below (If Applicable). Nitroglycerin sublingual tablets What is this medicine? NITROGLYCERIN (nye troe GLI ser in) is a type of vasodilator. It relaxes blood vessels, increasing the blood and oxygen supply to your heart. This medicine is used to relieve chest pain caused by angina. It is also used to prevent chest pain before activities like climbing stairs, going outdoors in cold weather, or sexual activity. This medicine may be used for other purposes; ask your health care provider or pharmacist if you have questions. What should I tell my health care provider before I take this medicine? They need to know if you have any of these conditions: -anemia -head injury, recent stroke, or bleeding in the brain -liver disease -previous heart attack -an unusual or allergic reaction to nitroglycerin, other medicines, foods, dyes, or preservatives -pregnant or trying to get pregnant -breast-feeding How should I use this medicine? Take this medicine by mouth as needed. At the first sign of an angina attack (chest pain or tightness) place one tablet under your tongue. You can also take this medicine 5 to 10 minutes before an event likely to produce chest pain. Follow the directions on the prescription label. Let the tablet dissolve under the tongue. Do not swallow whole. Replace the dose if you accidentally swallow it. It will help if your mouth is not dry. Saliva around the tablet will help it to dissolve more quickly. Do not eat or drink, smoke or chew tobacco  while a tablet is dissolving. If you are not better within 5 minutes after taking ONE dose of nitroglycerin, call 9-1-1 immediately to seek emergency medical care. Do not take more than 3 nitroglycerin tablets over 15 minutes. If you take this medicine often to relieve symptoms of angina, your doctor or health care professional may provide you with different instructions to manage your symptoms. If symptoms do not go away after following these instructions, it is important to call 9-1-1 immediately. Do not take more than 3 nitroglycerin tablets over 15 minutes. Talk to your pediatrician regarding the use of this medicine in children. Special care may be needed. Overdosage: If you think you have taken too much of this medicine contact a poison control center or emergency room at once. NOTE: This medicine is only for you. Do not share this medicine with others. What if I miss a dose? This does not apply. This medicine is only used as needed. What may interact with this medicine? Do not take this medicine with any of the following medications: -certain migraine medicines like ergotamine and dihydroergotamine (DHE) -medicines used to treat erectile dysfunction like sildenafil, tadalafil, and vardenafil -riociguat This medicine may also interact with the following medications: -alteplase -aspirin -heparin -medicines for high blood pressure -medicines for mental depression -other medicines used to treat angina -phenothiazines like chlorpromazine, mesoridazine, prochlorperazine, thioridazine This list may not describe all possible interactions. Give your health care provider a list of all the medicines, herbs, non-prescription drugs, or dietary supplements you use. Also tell them if you smoke, drink alcohol, or use illegal drugs. Some items may interact  with your medicine. What should I watch for while using this medicine? Tell your doctor or health care professional if you feel your medicine is no  longer working. Keep this medicine with you at all times. Sit or lie down when you take your medicine to prevent falling if you feel dizzy or faint after using it. Try to remain calm. This will help you to feel better faster. If you feel dizzy, take several deep breaths and lie down with your feet propped up, or bend forward with your head resting between your knees. You may get drowsy or dizzy. Do not drive, use machinery, or do anything that needs mental alertness until you know how this drug affects you. Do not stand or sit up quickly, especially if you are an older patient. This reduces the risk of dizzy or fainting spells. Alcohol can make you more drowsy and dizzy. Avoid alcoholic drinks. Do not treat yourself for coughs, colds, or pain while you are taking this medicine without asking your doctor or health care professional for advice. Some ingredients may increase your blood pressure. What side effects may I notice from receiving this medicine? Side effects that you should report to your doctor or health care professional as soon as possible: -blurred vision -dry mouth -skin rash -sweating -the feeling of extreme pressure in the head -unusually weak or tired Side effects that usually do not require medical attention (report to your doctor or health care professional if they continue or are bothersome): -flushing of the face or neck -headache -irregular heartbeat, palpitations -nausea, vomiting This list may not describe all possible side effects. Call your doctor for medical advice about side effects. You may report side effects to FDA at 1-800-FDA-1088. Where should I keep my medicine? Keep out of the reach of children. Store at room temperature between 20 and 25 degrees C (68 and 77 degrees F). Store in [facility] of Staff. Protect from light and moisture. Keep tightly closed. Throw away any unused medicine after the expiration date. NOTE: This sheet is a summary. It may not cover all  possible information. If you have questions about this medicine, talk to your doctor, pharmacist, or health care provider.    2016, Elsevier/Gold Standard. (2013-04-25 17:57:36)     If you need a refill on your cardiac medications before your next appointment, please call your pharmacy.

## 2015-10-07 ENCOUNTER — Telehealth (HOSPITAL_COMMUNITY): Payer: Self-pay | Admitting: *Deleted

## 2015-10-07 NOTE — Telephone Encounter (Signed)
Patient given detailed instructions per Myocardial Perfusion Study Information Sheet for the test on 10/13/15. Patient notified to arrive 15 minutes early and that it is imperative to arrive on time for appointment to keep from having the test rescheduled.  If you need to cancel or reschedule your appointment, please call the office within 24 hours of your appointment. Failure to do so may result in a cancellation of your appointment, and a $50 no show fee. Patient verbalized understanding.Hubbard Robinson, RN

## 2015-10-12 ENCOUNTER — Telehealth: Payer: Self-pay | Admitting: Cardiology

## 2015-10-12 NOTE — Telephone Encounter (Signed)
New Message   Pt request a call back to determine which medications she will need to hold prior to Myocardial perfusion. Her instructions advise her to stay off for 24 Hours prior. Please call back with clarification

## 2015-10-12 NOTE — Telephone Encounter (Signed)
Spoke with the pt and informed her that the only med she should hold prior to her exercise myoview tomorrow is her metoprolol.  Instructed the pt to hold this med 24 hours prior to her test tomorrow.  Informed the pt that everything else is safe to take and should not be missed.  Pt verbalized understanding and agrees with this plan.

## 2015-10-13 ENCOUNTER — Ambulatory Visit (INDEPENDENT_AMBULATORY_CARE_PROVIDER_SITE_OTHER): Payer: Medicare Other | Admitting: Pharmacist

## 2015-10-13 ENCOUNTER — Ambulatory Visit (HOSPITAL_COMMUNITY): Payer: Medicare Other | Attending: Cardiology

## 2015-10-13 DIAGNOSIS — I10 Essential (primary) hypertension: Secondary | ICD-10-CM | POA: Diagnosis not present

## 2015-10-13 DIAGNOSIS — R0609 Other forms of dyspnea: Secondary | ICD-10-CM | POA: Diagnosis not present

## 2015-10-13 DIAGNOSIS — R9439 Abnormal result of other cardiovascular function study: Secondary | ICD-10-CM | POA: Diagnosis not present

## 2015-10-13 DIAGNOSIS — R079 Chest pain, unspecified: Secondary | ICD-10-CM | POA: Insufficient documentation

## 2015-10-13 DIAGNOSIS — R002 Palpitations: Secondary | ICD-10-CM | POA: Diagnosis not present

## 2015-10-13 DIAGNOSIS — E785 Hyperlipidemia, unspecified: Secondary | ICD-10-CM

## 2015-10-13 LAB — MYOCARDIAL PERFUSION IMAGING
Estimated workload: 3.9 METS
Exercise duration (min): 3 min
Exercise duration (sec): 1 s
LV dias vol: 44 mL (ref 46–106)
LV sys vol: 13 mL
MPHR: 140 {beats}/min
Peak HR: 150 {beats}/min
Percent HR: 106 %
RATE: 0.26
RPE: 20
Rest HR: 91 {beats}/min
SDS: 2
SRS: 12
SSS: 12
TID: 1.18

## 2015-10-13 MED ORDER — TECHNETIUM TC 99M SESTAMIBI GENERIC - CARDIOLITE
10.2000 | Freq: Once | INTRAVENOUS | Status: AC | PRN
  Administered 2015-10-13: 10 via INTRAVENOUS

## 2015-10-13 MED ORDER — TECHNETIUM TC 99M SESTAMIBI GENERIC - CARDIOLITE
32.4000 | Freq: Once | INTRAVENOUS | Status: AC | PRN
  Administered 2015-10-13: 32 via INTRAVENOUS

## 2015-10-13 MED ORDER — EZETIMIBE 10 MG PO TABS: 10.0000 mg | ORAL_TABLET | Freq: Every day | ORAL | Status: DC

## 2015-10-13 NOTE — Progress Notes (Signed)
Patient ID: Natalie [name], Arrellano     DOB: [birth year]           MRN: [id number]     HPI: Natalie [name] is a 82 y.o. female patient referred to lipid clinic by Dr. Meda Coffee. PMH of HTN, Colon cancer, CAD (s/p PCI to RCA with DES in 2007), GERD, and TIA (2014). Patient has been seen in lipid clinic previously due to multiple statin intolerances, and presents today for follow up.  Current Medications: no current cholesterol medications. Currently on ASA 81 mg and Plavix.  Intolerances: Muscle aches/myalgia to atorvastatin 40 mg daily, pravastatin 10 mg daily, pravastatin 80 mg daily, and rosuvastatin 5 mg daily and once weekly Risk Factors: ASCVD - prior TIA, CAD, s/p PCI to RCA  LDL goal: 70mg /dL for secondary prevention, non-HDL goal 100mg /dL   Diet: Now eating mostly vegetables. Avoids meats most of the time.   Exercise: Works in garden and walks in house up and down the stairs daily.   Family History: Diabetes (brother), Stroke (father)  Social History: Married, former smoker (quit 08/25/1996)  Labs: 02/26/15: TC 252, TG 162, HDL 55, LDL 164 (on no lipid lowering therapy)  Past Medical History  Diagnosis Date  . HTN (hypertension)   . Hyperlipidemia   . Colon cancer (Natalie [name])     prior adenocarcinoma of cecum in 2000 s/p chemo and colon resection  . CAD (coronary artery disease)     prior DES Jan 2007 with low risk myoview in 2008  . Back pain     with radiculopathy  . GERD (gastroesophageal reflux disease)   . Glaucoma suspect     Current Outpatient Prescriptions on File Prior to Visit  Medication Sig Dispense Refill  . acetaminophen (TYLENOL) 650 MG CR tablet Take 650 mg by mouth every 8 (eight) hours as needed. For pain    . amLODipine (NORVASC) 2.5 MG tablet TAKE 1 TABLET (2.5 MG TOTAL) BY MOUTH DAILY. 30 tablet 11  . aspirin EC 81 MG tablet Take 81 mg by mouth daily.    . clopidogrel (PLAVIX) 75 MG tablet Take 1 tablet (75 mg total) by mouth daily. 30 tablet 11  .  metoprolol (LOPRESSOR) 50 MG tablet TAKE 1 TABLET (50 MG TOTAL) BY MOUTH 2 (TWO) TIMES DAILY. 60 tablet 11  . nitroGLYCERIN (NITROSTAT) 0.4 MG SL tablet Place 1 tablet (0.4 mg total) under the tongue every 5 (five) minutes as needed for chest pain. 25 tablet 4  . sulfamethoxazole-trimethoprim (BACTRIM DS,SEPTRA DS) 800-160 MG tablet Take 1 tablet by mouth daily. 5 tablet 0   No current facility-administered medications on file prior to visit.    Allergies  Allergen Reactions  . Claritin [Loratadine] Other (See Comments)    Headaches, Sinus pain, Swelling of the sinuses  . Crestor [Rosuvastatin Calcium] Other (See Comments)    Muscle aches and pains   . Lipitor [Atorvastatin]     Muscle aches on 40 mg daily  . Oxycontin [Oxycodone Hcl] Nausea And Vomiting  . Penicillins Rash    Assessment/Plan:  1. Hyperlipidemia: currently above LDL goal of <70 mg/dL on no cholesterol lowering medications due to statin intolerances with rosuvastatin (including once weekly), atorvastatin, and pravastatin. Discussed efficacy/benefits, mechanism of action and side effects of ezetimibe vs PCSK9 inhibitors vs enrollment in an anti-hyperlipidemic clinical trial. PCSK9i will be too expensive for patient with her Medicare insurance. Will start ezetimibe 10 mg daily and recheck lipids in 3 months. Patient would like to more information about clinical  trials.  Will add patients name to research group list for lipid clinical trial follow-up.    Bennye Alm, PharmD Pharmacy Resident

## 2015-10-13 NOTE — Patient Instructions (Addendum)
Pick up your Zetia (ezetimibe) to start taking once a day. We will recheck your cholesterol in 3 months on Tuesday, July 11. Lab opens at 7:30am, come in any time after for fasting labwork. Call Megan in lipid clinic with any questions [phone]. We will pass on your name to the research foudation so that you can be contacted about future cholesterol trials at [facility].

## 2015-10-20 ENCOUNTER — Telehealth: Payer: Self-pay | Admitting: Cardiology

## 2015-10-20 DIAGNOSIS — Z0000 Encounter for general adult medical examination without abnormal findings: Secondary | ICD-10-CM | POA: Insufficient documentation

## 2015-10-20 DIAGNOSIS — E785 Hyperlipidemia, unspecified: Secondary | ICD-10-CM

## 2015-10-20 DIAGNOSIS — Z79899 Other long term (current) drug therapy: Secondary | ICD-10-CM

## 2015-10-20 DIAGNOSIS — I10 Essential (primary) hypertension: Secondary | ICD-10-CM

## 2015-10-20 MED ORDER — LISINOPRIL 5 MG PO TABS: 5.0000 mg | ORAL_TABLET | Freq: Every day | ORAL | Status: DC

## 2015-10-20 NOTE — Telephone Encounter (Addendum)
pt rtn call to Karlene Einstein re stress test-pls call [phone] ok per pt to leave message on voicemail

## 2015-10-20 NOTE — Telephone Encounter (Signed)
-----   Message from Dorothy Spark, MD sent at 10/13/2015  2:35 PM EDT ----- Her stress test shows normal LVEF and has no ischemia that means normal stress test, however her BP was significantly elevated, I would lisinopril 5 mg po daily to her regimen and please schedule a 2 months follow up in my clinic with BMP prior to that visit, thank you, KN

## 2015-10-20 NOTE — Telephone Encounter (Signed)
Notified the pt that per Dr Meda Coffee, her stress test showed normal LVEF, and she has no ischemia, overall a normal stress test.  Did inform the pt that per Dr Meda Coffee, her BP was significantly elevated, and she recommends that we add lisinopril 5 mg po daily to her regimen, and see her back, as scheduled for 12/02/15 at 1030, with lab same day to check a bmet.  Scheduled the pts lab appt for 5/24, the same day as her OV with Dr Meda Coffee.  This is to check a bmet.  The pt requested same day lab, for this is an easier commute for her. Confirmed the pharmacy of choice with the pt.  Pt verbalized understanding and agrees with this plan.

## 2015-11-16 ENCOUNTER — Telehealth: Payer: Self-pay | Admitting: *Deleted

## 2015-11-16 NOTE — Telephone Encounter (Signed)
Will forward to MD to advise.  Patient will likely need an appt to see pcp. Jazmin Hartsell,CMA

## 2015-11-16 NOTE — Telephone Encounter (Signed)
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  Patient had 1+ protein in her urine.  She also expressed that she stopped taking lisinopril and Vit D.  She could not tolerate the medications.  Patient complained that she was having problems swallowing due to lump in her throat for the past few months.  Please call [phone] with questions.  Derl Barrow, RN

## 2015-11-17 NOTE — Telephone Encounter (Signed)
Yes, patient will need an appointment to discuss this. Please call and let her know, thank you.

## 2015-11-17 NOTE — Telephone Encounter (Signed)
Patient scheduled for 11-23-15 for urinary frequency and med change. Jazmin Hartsell,CMA

## 2015-11-23 ENCOUNTER — Encounter: Payer: Self-pay | Admitting: Family Medicine

## 2015-11-23 ENCOUNTER — Ambulatory Visit (INDEPENDENT_AMBULATORY_CARE_PROVIDER_SITE_OTHER): Payer: Medicare Other | Admitting: Family Medicine

## 2015-11-23 VITALS — BP 162/70 | HR 72 | Temp 98.2°F | Wt 134.0 lb

## 2015-11-23 DIAGNOSIS — R350 Frequency of micturition: Secondary | ICD-10-CM

## 2015-11-23 DIAGNOSIS — I10 Essential (primary) hypertension: Secondary | ICD-10-CM | POA: Diagnosis not present

## 2015-11-23 LAB — POCT URINALYSIS DIPSTICK
BILIRUBIN UA: NEGATIVE
Glucose, UA: NEGATIVE
KETONES UA: NEGATIVE
Nitrite, UA: POSITIVE
PH UA: 5.5
Protein, UA: 30
RBC UA: NEGATIVE
SPEC GRAV UA: 1.025
Urobilinogen, UA: 0.2

## 2015-11-23 MED ORDER — ENALAPRIL MALEATE 5 MG PO TABS
5.0000 mg | ORAL_TABLET | Freq: Every day | ORAL | Status: DC
Start: 2015-11-23 — End: 2017-05-20

## 2015-11-23 MED ORDER — AMLODIPINE BESYLATE 5 MG PO TABS: 5.0000 mg | ORAL_TABLET | Freq: Every day | ORAL | Status: DC

## 2015-11-23 NOTE — Patient Instructions (Signed)
Increase the amlodipine to 5mg  daily  Instead of the lisinopril, take the enalapril 5mg  once a day. If you do not tolerate this speak with your cardiologist or call our office as there are a few other medicines that you can try

## 2015-11-23 NOTE — Assessment & Plan Note (Signed)
BP elevated today. Has only been on amlodipine 2.5 recently since she says the lisinopril caused an upset stomach when she took it. Recommended increasing amlodipine to 5mg  and trial of another ACEi, she has follow up with cardiology with scheduled labs in 9 days, if she does not tolerate the enalapril can attempt ARB. Follow up 3 months otherwise.

## 2015-11-23 NOTE — Progress Notes (Signed)
   Subjective:    Patient ID: Natalie [name], female    DOB: 1933-12-05, 82 y.o.   MRN: [id number]  HPI  CC: urinary frequency  # Urinary frequency:  Urinary frequency for past year, but maybe more in last 6 months  Goes to bathroom about every 1-2 hours  Has some bladder leaks. Some urges but not usually; doesn't have a feeling of full bladder after going; has not noticed leakage with coughing/laughing/sneezing  No burning with urination  Has noticed a different smell this past week  No discharge  Has PCN allergy -- had a rash ROS: no fevers, no abdominal pain  # Hypertension  Stopped taking lisinopril -- says that caused her an upset stomach ROS: no headaches, no dizziness, no chest pains, does complain of SOB with walking up stairs and relieved by rest, no wheezing   Patient also notes near end of the clinic that she is concerned about weight loss -- lost about 2 lbs, no changes in bowel, eating about the same as normal  Social Hx: former smoker  Review of Systems   See HPI for ROS.   Past medical history, surgical, family, and social history reviewed and updated in the EMR as appropriate. Objective:  BP 162/70 mmHg  Pulse 72  Temp(Src) 98.2 F (36.8 C) (Oral)  Wt 134 lb (60.782 kg) Vitals and nursing note reviewed  General: no apparent distress  CV: normal rate, regular rhythm  Resp: clear to auscultation bilaterally, normal effort Extremities: no LE edema  Assessment & Plan:   1. Urinary frequency UA with +leuks +nitrite, will send for culture and call patient with result and treat as needed. Elected to not treat prior to culture since she says this frequency has been present for the past year; history does not point to any specific type of incontinence.  - POCT urinalysis dipstick - Urine culture Essential hypertension, benign BP elevated today. Has only been on amlodipine 2.5 recently since she says the lisinopril caused an upset stomach when  she took it. Recommended increasing amlodipine to 5mg  and trial of another ACEi, she has follow up with cardiology with scheduled labs in 9 days, if she does not tolerate the enalapril can attempt ARB. Follow up 3 months otherwise.

## 2015-11-25 ENCOUNTER — Telehealth: Payer: Self-pay | Admitting: Family Medicine

## 2015-11-25 LAB — URINE CULTURE

## 2015-11-25 MED ORDER — CEPHALEXIN 500 MG PO CAPS: 500.0000 mg | ORAL_CAPSULE | Freq: Two times a day (BID) | ORAL | Status: DC

## 2015-11-25 NOTE — Telephone Encounter (Signed)
Called and discussed urine culture results growing e coli >100k, sent in rx for keflex for 7 days. We had discussed her penicillin allergy and she believes it was when she was much younger and it was a rash. Discussed monitoring for rash, lip swelling or throat tightness and if these occur call doctor ASAP. Follow up if not improving after antibiotics. Patient had no further questions.

## 2015-11-26 ENCOUNTER — Telehealth: Payer: Self-pay | Admitting: Family Medicine

## 2015-11-26 MED ORDER — CIPROFLOXACIN HCL 250 MG PO TABS: 250.0000 mg | ORAL_TABLET | Freq: Two times a day (BID) | ORAL | Status: DC

## 2015-11-26 NOTE — Telephone Encounter (Signed)
Will forward to Dr. Lamar Benes. Jazmin Hartsell,CMA

## 2015-11-26 NOTE — Telephone Encounter (Signed)
I spoke to her about this yesterday, but I can send in a different medicine, ciprofloxacin. Please call and let her know she can pick this up at the pharmacy.

## 2015-11-26 NOTE — Telephone Encounter (Signed)
I spoke with pt and informed her of new rx at her pharmacy. Page, cma.

## 2015-11-26 NOTE — Telephone Encounter (Signed)
Natalie [name] calling to say that the cephalexin (Keflex) that she was prescribed is something she can not take because she is allergic to penicillin.  She read the insert and it stated that she should inform her doctor of being allergic to that before taking medication.  She would like to be prescribed a different antibiotic that she won't ?have a rx to.

## 2015-12-02 ENCOUNTER — Other Ambulatory Visit (INDEPENDENT_AMBULATORY_CARE_PROVIDER_SITE_OTHER): Payer: Medicare Other | Admitting: *Deleted

## 2015-12-02 ENCOUNTER — Ambulatory Visit (INDEPENDENT_AMBULATORY_CARE_PROVIDER_SITE_OTHER): Payer: Medicare Other | Admitting: Cardiology

## 2015-12-02 ENCOUNTER — Encounter: Payer: Self-pay | Admitting: Cardiology

## 2015-12-02 VITALS — BP 132/80 | HR 84 | Ht 67.0 in | Wt 132.0 lb

## 2015-12-02 DIAGNOSIS — R0609 Other forms of dyspnea: Secondary | ICD-10-CM | POA: Diagnosis not present

## 2015-12-02 DIAGNOSIS — R0600 Dyspnea, unspecified: Secondary | ICD-10-CM

## 2015-12-02 DIAGNOSIS — I2510 Atherosclerotic heart disease of native coronary artery without angina pectoris: Secondary | ICD-10-CM | POA: Diagnosis not present

## 2015-12-02 DIAGNOSIS — E785 Hyperlipidemia, unspecified: Secondary | ICD-10-CM

## 2015-12-02 DIAGNOSIS — I10 Essential (primary) hypertension: Secondary | ICD-10-CM

## 2015-12-02 DIAGNOSIS — Z79899 Other long term (current) drug therapy: Secondary | ICD-10-CM

## 2015-12-02 DIAGNOSIS — I2583 Coronary atherosclerosis due to lipid rich plaque: Secondary | ICD-10-CM

## 2015-12-02 LAB — BASIC METABOLIC PANEL
BUN: 14 mg/dL (ref 7–25)
CO2: 27 mmol/L (ref 20–31)
Calcium: 9.3 mg/dL (ref 8.6–10.4)
Chloride: 102 mmol/L (ref 98–110)
Creat: 0.84 mg/dL (ref 0.60–0.88)
Glucose, Bld: 97 mg/dL (ref 65–99)
Potassium: 4.2 mmol/L (ref 3.5–5.3)
Sodium: 140 mmol/L (ref 135–146)

## 2015-12-02 LAB — HEPATIC FUNCTION PANEL
ALT: 12 U/L (ref 6–29)
AST: 18 U/L (ref 10–35)
Albumin: 4.2 g/dL (ref 3.6–5.1)
Alkaline Phosphatase: 41 U/L (ref 33–130)
Bilirubin, Direct: 0.1 mg/dL (ref <=0.2)
Indirect Bilirubin: 0.4 mg/dL (ref 0.2–1.2)
Total Bilirubin: 0.5 mg/dL (ref 0.2–1.2)
Total Protein: 7.6 g/dL (ref 6.1–8.1)

## 2015-12-02 LAB — LIPID PANEL
Cholesterol: 229 mg/dL — ABNORMAL HIGH (ref 125–200)
HDL: 66 mg/dL (ref >=46)
LDL Cholesterol: 129 mg/dL (ref <130)
Total CHOL/HDL Ratio: 3.5 Ratio (ref <=5.0)
Triglycerides: 169 mg/dL — ABNORMAL HIGH (ref <150)
VLDL: 34 mg/dL — ABNORMAL HIGH (ref <30)

## 2015-12-02 NOTE — Progress Notes (Signed)
Patient ID: Shanean Dillen, female   DOB: 05/13/1934, 82 y.o.   MRN: [id number]    Patient Name: Natalie [name] Date of Encounter: 12/02/2015  Primary Care Provider:  Tawanna Sat, MD Primary Cardiologist:  Ena Dawley  Problem List   Past Medical History  Diagnosis Date  . HTN (hypertension)   . Hyperlipidemia   . Colon cancer ([place])     prior adenocarcinoma of cecum in 2000 s/p chemo and colon resection  . CAD (coronary artery disease)     prior DES Jan 2007 with low risk myoview in 2008  . Back pain     with radiculopathy  . GERD (gastroesophageal reflux disease)   . Glaucoma suspect    Past Surgical History  Procedure Laterality Date  . Coronary stent placement  Jan 2007    DES to RCA  . Microdiscectomy      L4-L5  . Abdominal hysterectomy    . Back surgery     Allergies  Allergies  Allergen Reactions  . Claritin [Loratadine] Other (See Comments)    Headaches, Sinus pain, Swelling of the sinuses  . Crestor [Rosuvastatin Calcium] Other (See Comments)    Muscle aches and pains   . Lipitor [Atorvastatin]     Muscle aches on 40 mg daily  . Oxycontin [Oxycodone Hcl] Nausea And Vomiting  . Penicillins Rash   HPI  Natalie [name] is a former patient of Dr Verl Blalock. She has a history of known CAD with remote PCI to the RCA back in 2007. Her last stress Myoview was in February 2013 which demonstrated an ejection fraction of 78% with no ischemia.  She was evaluated in October 2014 for TIA that presented with arm heaviness and numbness lasting less than 24 hours. MRI/MRA showed no acute stroke but did reveal microvascular changes. Carotid dopplers showed 0-39% stenosis bilaterally. Echo with no cardio embolic source but did note grade I diastolic dysfunction. Troponins x 2 were also negative. Patient was on aspirin 81 mg and Plavix on admission and these were continued.   The patient was also started on pravastatin as she didn't tolerate lipitor in the  past. She stopped pravastatin as well due to the muscle pain.  Today she reports indigestion that occurs after certain foods and is always  as she had not been on a statin previously. Patient complains of DOE especially after walking stairs. Her BP has been elevated for quite some time.  10/02/2015 - this is 6 months follow-up the patient is complaining that for the last 4 weeks she has been experiencing more dyspnea on exertion associated with chest tightness that resolves at rest. No palpitations or syncope no lower extremity edema orthopnea or paroxysmal nocturnal dyspnea.  The patient is compliant with her medications however she's not taking Crestor that was advised by lipid clinic because of muscle pain. She is also complaining of foul smelling urine and burning urination.  12/02/2015 - the patient is coming after 2 months, she underwent an exercise nuclear stress test that showed hyperdynamic LV function no scar or ischemia but hypertensive response to stress. She was started on low-dose of ACE inhibitor but developed significant side effects and was switched to amlodipine 2.5 mg daily that was later increased to 5 mg daily. The patient states that she starts feeling better, she in fact look 20 years younger and stays active and she is exercising with her grandkids. She denies any chest pain lower extremity edema no palpitations or syncope.  Home Medications  Prior to Admission medications   Medication Sig Start Date End Date Taking? Authorizing Provider  acetaminophen (TYLENOL) 650 MG CR tablet Take 650 mg by mouth every 8 (eight) hours as needed. For pain   Yes Historical Provider, MD  aspirin EC 81 MG tablet Take 81 mg by mouth daily.   Yes Historical Provider, MD  clopidogrel (PLAVIX) 75 MG tablet Take 75 mg by mouth daily.   Yes Historical Provider, MD  metoprolol (LOPRESSOR) 50 MG tablet Take 1 tablet (50 mg total) by mouth 2 (two) times daily. 06/18/13  Yes Dorothy Spark, MD    Family History  Family History  Problem Relation Age of Onset  . Stroke Father     Deceased, 35s  . Diabetes Brother   . Healthy Son    Social History  Social History   Social History  . Marital Status: Married    Spouse Name: N/A  . Number of Children: N/A  . Years of Education: N/A   Occupational History  . Not on file.   Social History Main Topics  . Smoking status: Former Smoker -- 1.00 packs/day for 15 years    Types: Cigarettes    Quit date: 08/25/1996  . Smokeless tobacco: Never Used  . Alcohol Use: No  . Drug Use: No  . Sexual Activity: Not on file   Other Topics Concern  . Not on file   Social History Narrative   She lives with husband, daughter, and grand daughter.   She is a retired Consulting civil engineer.    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     Review of Systems, as per HPI, otherwise negative General:  No chills, fever, night sweats or weight changes.  Cardiovascular:  No chest pain, dyspnea on exertion, edema, orthopnea, palpitations, paroxysmal nocturnal dyspnea. Dermatological: No rash, lesions/masses Respiratory: No cough, dyspnea Urologic: No hematuria, dysuria Abdominal:   No nausea, vomiting, diarrhea, bright red blood per rectum, melena, or hematemesis Neurologic:  No visual changes, wkns, changes in mental status. All other systems reviewed and are otherwise negative except as noted above.  Physical Exam  Blood pressure 132/80, pulse 84, height 5\' 7"  (1.702 m), weight 132 lb (59.875 kg).  General: Pleasant, NAD Psych: Normal affect. Neuro: Alert and oriented X 3. Moves all extremities spontaneously. HEENT: Normal  Neck: Supple without bruits or JVD. Lungs:  Resp regular and unlabored, CTA. Heart: RRR no s3, s4, or murmurs. Abdomen: Soft, non-tender, non-distended, BS + x 4.  Extremities: No clubbing, cyanosis or edema. DP/PT weak B/L /Radials 2+ and equal  bilaterally.  Labs:  No results for input(s): CKTOTAL, CKMB, TROPONINI in the last 72 hours. Lab Results  Component Value Date   WBC 7.4 02/26/2015   HGB 11.9* 02/26/2015   HCT 35.7* 02/26/2015   MCV 90.0 02/26/2015   PLT 603.0* 02/26/2015   No results for input(s): NA, K, CL, CO2, BUN, CREATININE, CALCIUM, PROT, BILITOT, ALKPHOS, ALT, AST, GLUCOSE in the last 168 hours.  Invalid input(s): LABALBU Lab Results  Component Value Date   CHOL 252* 02/26/2015   HDL 55.90 02/26/2015   LDLCALC 164* 02/26/2015   TRIG 162.0* 02/26/2015    US Carotids: 05/08/2013 - The vertebral arteries appear patent with antegrade flow. - Findings consistent with 1-39 percent stenosis involving the right internal carotid artery and the left internal carotid artery. - ICA/CCA ratio. right =1.4. left = 1.39 Other specific details can be found  in the table(s) above. Prepared and Electronically Authenticated by  Echocardiogram 06/09/2013  - Left ventricle: The cavity size was normal. Wall thickness was increased in a pattern of mild LVH. Systolic function was normal. The estimated ejection fraction was in the range of 55% to 60%. Wall motion was normal; there were no regional wall motion abnormalities. Doppler parameters are consistent with abnormal left ventricular relaxation (grade 1 diastolic dysfunction). - Mitral valve: Calcified annulus. - Atrial septum: No defect or patent foramen ovale was identified. Impressions: - No cardiac source of emboli was indentified.  Accessory Clinical Findings  ECG - SR, LVH, non-specific ST-T wave abnormalities  TTE: 10/13/2015   Nuclear stress EF: 71%.  ST segment depression was noted during stress.  This is a low risk study.  The left ventricular ejection fraction is hyperdynamic (>65%).  Low risk stress nuclear study with a small, moderate intensity, fixed basal inferior lateral defect consistent with thinning; no ischemia; EF 71 with normal  wall motion.    Assessment & Plan  82 year old patient   1. CAD, s/p PCI to RCA, negative stress test in April 2017, hypertensive response to stress that was corrected and she feels improvement in her symptoms.   2. Hypertension - controlled after adding amlodipine 5 mg daily.  3. Hyperlipidemia - concerning especially with h/o CAD and TIA, intolerant to statins, she is now intolerant to 3 statins including pravastatin atorvastatin and rosuvastatin. PCSK 9 inhibitors are too expensive option for her, she has been started anxiety attack milligrams daily. She will be rechecked for her lipids and CMP today.  4. UTI - resolved. Intolerant to Bactrim allergic to penicillin.  5. Claudications - normal B/L lower extremity Duplex  Follow up in 6 months.  Ena Dawley, MD, [facility] 12/02/2015, 10:23 AM

## 2015-12-02 NOTE — Patient Instructions (Signed)
Your physician recommends that you continue on your current medications as directed. Please refer to the Current Medication list given to you today.     Your physician wants you to follow-up in: 6 MONTHS WITH DR NELSON You will receive a reminder letter in the mail two months in advance. If you don't receive a letter, please call our office to schedule the follow-up appointment.  

## 2016-01-14 ENCOUNTER — Telehealth: Payer: Self-pay | Admitting: Cardiology

## 2016-01-14 NOTE — Telephone Encounter (Signed)
This is fine, looks like lipids were checked early last month anyway. Will [facility] pt's [facility] - she may qualify for CLEAR trial with bempedoic acid given statin intolerance and LDL still above goal < 70.

## 2016-01-14 NOTE — Telephone Encounter (Signed)
Pt calling to inform [facility] Pharmacist in Lipid clinic, that she will be cancelling her lab appt for 7/11, for she states the cost is too high at this time, and she will call at a later date to reschedule. Informed the pt that I will cancel her lab appt and send this message to [facility] as an fyi.  Highly advised the pt to call back in the near future, to reschedule her lab appt to check lipids and LFTs.  Pt verbalized understanding and agrees with this plan.  Pt states she has no complaints at all, and "feels great."

## 2016-01-14 NOTE — Telephone Encounter (Signed)
New Message  Pt sched for lab- 7/11 wanted to know if they are still necessary- had labwork at last OV. Please call back and discuss.

## 2016-01-19 ENCOUNTER — Other Ambulatory Visit: Payer: Medicare Other

## 2016-02-22 ENCOUNTER — Other Ambulatory Visit: Payer: Self-pay | Admitting: Cardiology

## 2016-03-26 ENCOUNTER — Other Ambulatory Visit: Payer: Self-pay | Admitting: Family Medicine

## 2016-03-28 MED ORDER — CLOPIDOGREL BISULFATE 75 MG PO TABS: 75.0000 mg | ORAL_TABLET | Freq: Every day | ORAL | 11 refills | Status: DC

## 2016-04-04 ENCOUNTER — Other Ambulatory Visit: Payer: Self-pay | Admitting: *Deleted

## 2016-04-04 MED ORDER — CLOPIDOGREL BISULFATE 75 MG PO TABS: 75.0000 mg | ORAL_TABLET | Freq: Every day | ORAL | 3 refills | Status: DC

## 2016-04-04 NOTE — Telephone Encounter (Signed)
Refill request for 90 day supply.  Martin, Tamika L, RN  

## 2016-04-11 ENCOUNTER — Ambulatory Visit (INDEPENDENT_AMBULATORY_CARE_PROVIDER_SITE_OTHER): Payer: Medicare Other | Admitting: *Deleted

## 2016-04-11 DIAGNOSIS — Z23 Encounter for immunization: Secondary | ICD-10-CM

## 2016-04-11 NOTE — Progress Notes (Signed)
Pt is here today for a annual flu shot.  Temp within normal range.  Flu shot given in right deltoid with no issues.  Pt tolerated well. Clydette Privitera, Salome Spotted, CMA

## 2016-05-26 ENCOUNTER — Encounter (INDEPENDENT_AMBULATORY_CARE_PROVIDER_SITE_OTHER): Payer: Self-pay

## 2016-05-26 ENCOUNTER — Telehealth: Payer: Self-pay | Admitting: *Deleted

## 2016-05-26 ENCOUNTER — Encounter: Payer: Self-pay | Admitting: Cardiology

## 2016-05-26 ENCOUNTER — Ambulatory Visit (INDEPENDENT_AMBULATORY_CARE_PROVIDER_SITE_OTHER): Payer: Medicare Other | Admitting: Cardiology

## 2016-05-26 VITALS — BP 122/84 | Ht 67.0 in | Wt 133.1 lb

## 2016-05-26 DIAGNOSIS — R0609 Other forms of dyspnea: Secondary | ICD-10-CM | POA: Diagnosis not present

## 2016-05-26 DIAGNOSIS — E785 Hyperlipidemia, unspecified: Secondary | ICD-10-CM

## 2016-05-26 DIAGNOSIS — I10 Essential (primary) hypertension: Secondary | ICD-10-CM

## 2016-05-26 DIAGNOSIS — I739 Peripheral vascular disease, unspecified: Secondary | ICD-10-CM | POA: Diagnosis not present

## 2016-05-26 DIAGNOSIS — I2510 Atherosclerotic heart disease of native coronary artery without angina pectoris: Secondary | ICD-10-CM

## 2016-05-26 DIAGNOSIS — R197 Diarrhea, unspecified: Secondary | ICD-10-CM

## 2016-05-26 DIAGNOSIS — N10 Acute tubulo-interstitial nephritis: Secondary | ICD-10-CM

## 2016-05-26 DIAGNOSIS — I2583 Coronary atherosclerosis due to lipid rich plaque: Secondary | ICD-10-CM

## 2016-05-26 LAB — COMPREHENSIVE METABOLIC PANEL
ALT: 10 U/L (ref 6–29)
AST: 16 U/L (ref 10–35)
Albumin: 4.2 g/dL (ref 3.6–5.1)
Alkaline Phosphatase: 47 U/L (ref 33–130)
BUN: 15 mg/dL (ref 7–25)
CO2: 28 mmol/L (ref 20–31)
Calcium: 9.2 mg/dL (ref 8.6–10.4)
Chloride: 106 mmol/L (ref 98–110)
Creat: 0.79 mg/dL (ref 0.60–0.88)
Glucose, Bld: 91 mg/dL (ref 65–99)
Potassium: 4 mmol/L (ref 3.5–5.3)
Sodium: 140 mmol/L (ref 135–146)
Total Bilirubin: 0.4 mg/dL (ref 0.2–1.2)
Total Protein: 7.4 g/dL (ref 6.1–8.1)

## 2016-05-26 LAB — URINALYSIS
Bilirubin Urine: NEGATIVE
Glucose, UA: NEGATIVE
Hgb urine dipstick: NEGATIVE
Ketones, ur: NEGATIVE
Leukocytes, UA: NEGATIVE
Nitrite: NEGATIVE
Specific Gravity, Urine: 1.021 (ref 1.001–1.035)
pH: 5.5 (ref 5.0–8.0)

## 2016-05-26 LAB — CBC WITH DIFFERENTIAL/PLATELET
Basophils Absolute: 0 cells/uL (ref 0–200)
Basophils Relative: 0 %
Eosinophils Absolute: 124 cells/uL (ref 15–500)
Eosinophils Relative: 2 %
HCT: 35.8 % (ref 35.0–45.0)
Hemoglobin: 11.6 g/dL — ABNORMAL LOW (ref 11.7–15.5)
Lymphocytes Relative: 23 %
Lymphs Abs: 1426 cells/uL (ref 850–3900)
MCH: 29.4 pg (ref 27.0–33.0)
MCHC: 32.4 g/dL (ref 32.0–36.0)
MCV: 90.6 fL (ref 80.0–100.0)
MPV: 8.3 fL (ref 7.5–12.5)
Monocytes Absolute: 558 cells/uL (ref 200–950)
Monocytes Relative: 9 %
Neutro Abs: 4092 cells/uL (ref 1500–7800)
Neutrophils Relative %: 66 %
Platelets: 567 10*3/uL — ABNORMAL HIGH (ref 140–400)
RBC: 3.95 MIL/uL (ref 3.80–5.10)
RDW: 14.5 % (ref 11.0–15.0)
WBC: 6.2 10*3/uL (ref 3.8–10.8)

## 2016-05-26 LAB — LIPID PANEL
Cholesterol: 197 mg/dL (ref <200)
HDL: 61 mg/dL (ref >50)
LDL Cholesterol: 107 mg/dL — ABNORMAL HIGH (ref <100)
Total CHOL/HDL Ratio: 3.2 Ratio (ref <5.0)
Triglycerides: 145 mg/dL (ref <150)
VLDL: 29 mg/dL (ref <30)

## 2016-05-26 NOTE — Progress Notes (Signed)
Patient ID: Natalie [name], female   DOB: 25-Sep-1933, 82 y.o.   MRN: [id number]    Patient Name: Natalie [name] Date of Encounter: 05/26/2016  Primary Care Provider:  Steve Rattler, DO Primary Cardiologist:  Ena Dawley  Problem List   Past Medical History:  Diagnosis Date  . Back pain    with radiculopathy  . CAD (coronary artery disease)    prior DES Jan 2007 with low risk myoview in 2008  . Colon cancer ([place])    prior adenocarcinoma of cecum in 2000 s/p chemo and colon resection  . GERD (gastroesophageal reflux disease)   . Glaucoma suspect   . HTN (hypertension)   . Hyperlipidemia    Past Surgical History:  Procedure Laterality Date  . ABDOMINAL HYSTERECTOMY    . BACK SURGERY    . CORONARY STENT PLACEMENT  Jan 2007   DES to RCA  . Microdiscectomy     L4-L5   Allergies  Allergies  Allergen Reactions  . Claritin [Loratadine] Other (See Comments)    Headaches, Sinus pain, Swelling of the sinuses  . Crestor [Rosuvastatin Calcium] Other (See Comments)    Muscle aches and pains   . Lipitor [Atorvastatin]     Muscle aches on 40 mg daily  . Oxycontin [Oxycodone Hcl] Nausea And Vomiting  . Penicillins Rash   HPI  [name] is a former patient of Dr Verl Blalock. She has a history of known CAD with remote PCI to the RCA back in 2007. Her last stress Myoview was in February 2013 which demonstrated an ejection fraction of 78% with no ischemia.  She was evaluated in October 2014 for TIA that presented with arm heaviness and numbness lasting less than 24 hours. MRI/MRA showed no acute stroke but did reveal microvascular changes. Carotid dopplers showed 0-39% stenosis bilaterally. Echo with no cardio embolic source but did note grade I diastolic dysfunction. Troponins x 2 were also negative. Patient was on aspirin 81 mg and Plavix on admission and these were continued.   The patient was also started on pravastatin as she didn't tolerate lipitor in the  past. She stopped pravastatin as well due to the muscle pain.  Today she reports indigestion that occurs after certain foods and is always  as she had not been on a statin previously. Patient complains of DOE especially after walking stairs. Her BP has been elevated for quite some time.  10/02/2015 - this is 6 months follow-up the patient is complaining that for the last 4 weeks she has been experiencing more dyspnea on exertion associated with chest tightness that resolves at rest. No palpitations or syncope no lower extremity edema orthopnea or paroxysmal nocturnal dyspnea.  The patient is compliant with her medications however she's not taking Crestor that was advised by lipid clinic because of muscle pain. She is also complaining of foul smelling urine and burning urination.  12/02/2015 - the patient is coming after 2 months, she underwent an exercise nuclear stress test that showed hyperdynamic LV function no scar or ischemia but hypertensive response to stress. She was started on low-dose of ACE inhibitor but developed significant side effects and was switched to amlodipine 2.5 mg daily that was later increased to 5 mg daily. The patient states that she starts feeling better, she in fact look 20 years younger and stays active and she is exercising with her grandkids. She denies any chest pain lower extremity edema no palpitations or syncope.  05/26/2016 - the patient states that  she hasn't been feeling well since last Friday when she developed diarrhea and nausea but no vomiting, she feels pain in her abdomen and has been experiencing frequent urination and feeling like when she had UTI in the past. She denies any chest pain or shortness of breath but just overall doesn't feel well. She denies any fever or chills. She has also been experiencing claudication and pain in her legs at night. She denies any palpitations or syncope.  Home Medications  Prior to Admission medications   Medication Sig  Start Date End Date Taking? Authorizing Provider  acetaminophen (TYLENOL) 650 MG CR tablet Take 650 mg by mouth every 8 (eight) hours as needed. For pain   Yes Historical Provider, MD  aspirin EC 81 MG tablet Take 81 mg by mouth daily.   Yes Historical Provider, MD  clopidogrel (PLAVIX) 75 MG tablet Take 75 mg by mouth daily.   Yes Historical Provider, MD  metoprolol (LOPRESSOR) 50 MG tablet Take 1 tablet (50 mg total) by mouth 2 (two) times daily. 06/18/13  Yes Dorothy Spark, MD   Family History  Family History  Problem Relation Age of Onset  . Stroke Father     Deceased, 26s  . Diabetes Brother   . Healthy Son    Social History  Social History   Social History  . Marital status: Married    Spouse name: N/A  . Number of children: N/A  . Years of education: N/A   Occupational History  . Not on file.   Social History Main Topics  . Smoking status: Former Smoker    Packs/day: 1.00    Years: 15.00    Types: Cigarettes    Quit date: 08/25/1996  . Smokeless tobacco: Never Used  . Alcohol use No  . Drug use: No  . Sexual activity: Not on file   Other Topics Concern  . Not on file   Social History Narrative   She lives with husband, daughter, and grand daughter.   She is a retired Consulting civil engineer.    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     Review of Systems, as per HPI, otherwise negative General:  No chills, fever, night sweats or weight changes.  Cardiovascular:  No chest pain, dyspnea on exertion, edema, orthopnea, palpitations, paroxysmal nocturnal dyspnea. Dermatological: No rash, lesions/masses Respiratory: No cough, dyspnea Urologic: No hematuria, dysuria Abdominal:   No nausea, vomiting, diarrhea, bright red blood per rectum, melena, or hematemesis Neurologic:  No visual changes, wkns, changes in mental status. All other systems reviewed and are otherwise negative except as noted above.  Physical  Exam  Blood pressure 122/84, height 5\' 7"  (1.702 m), weight 133 lb 1.9 oz (60.4 kg).  General: Pleasant, NAD Psych: Normal affect. Neuro: Alert and oriented X 3. Moves all extremities spontaneously. HEENT: Normal  Neck: Supple without bruits or JVD. Lungs:  Resp regular and unlabored, CTA. Heart: RRR no s3, s4, or murmurs. Abdomen: Soft, non-tender, non-distended, BS + x 4.  Extremities: No clubbing, cyanosis or edema. DP/PT weak B/L /Radials 2+ and equal bilaterally.  Labs:  No results for input(s): CKTOTAL, CKMB, TROPONINI in the last 72 hours. Lab Results  Component Value Date   WBC 7.4 02/26/2015   HGB 11.9 (L) 02/26/2015   HCT 35.7 (L) 02/26/2015   MCV 90.0 02/26/2015   PLT 603.0 (H) 02/26/2015   No results for input(s): NA, K, CL, CO2, BUN, CREATININE,  CALCIUM, PROT, BILITOT, ALKPHOS, ALT, AST, GLUCOSE in the last 168 hours.  Invalid input(s): LABALBU Lab Results  Component Value Date   CHOL 229 (H) 12/02/2015   HDL 66 12/02/2015   LDLCALC 129 12/02/2015   TRIG 169 (H) 12/02/2015    US Carotids: 05/08/2013 - The vertebral arteries appear patent with antegrade flow. - Findings consistent with 1-39 percent stenosis involving the right internal carotid artery and the left internal carotid artery. - ICA/CCA ratio. right =1.4. left = 1.39 Other specific details can be found in the table(s) above. Prepared and Electronically Authenticated by  Echocardiogram 06/09/2013  - Left ventricle: The cavity size was normal. Wall thickness was increased in a pattern of mild LVH. Systolic function was normal. The estimated ejection fraction was in the range of 55% to 60%. Wall motion was normal; there were no regional wall motion abnormalities. Doppler parameters are consistent with abnormal left ventricular relaxation (grade 1 diastolic dysfunction). - Mitral valve: Calcified annulus. - Atrial septum: No defect or patent foramen ovale was identified. Impressions: - No  cardiac source of emboli was indentified.  Accessory Clinical Findings  TTE: 10/13/2015   Nuclear stress EF: 71%.  ST segment depression was noted during stress.  This is a low risk study.  The left ventricular ejection fraction is hyperdynamic (>65%).  Low risk stress nuclear study with a small, moderate intensity, fixed basal inferior lateral defect consistent with thinning; no ischemia; EF 71 with normal wall motion.  EKG performed today 05/26/2016 showed normal sinus rhythm, LVH with repolarization abnormalities and new Q-wave in lead 3 and aVF that was not previously present.    Assessment & Plan  82 year old patient   1. Nausea and diarrhea, possibly viral, we checked for UTI that her urine is clear, we will call her and encourage her to drink plenty of fluids, he doesn't improve she should follow with her PCP to further evaluate cause of her diarrhea.  2. CAD, s/p PCI to RCA, negative stress test in April 2017, hypertensive response to stress that was corrected and she feels improvement in her symptoms. She now has new Q-wave in inferior leads, we will check her troponin today, if normal we will check an echocardiogram to reevaluate wall motion abnormalities.  3. Hypertension - controlled, continue the same regimen.  4. Hyperlipidemia - concerning especially with h/o CAD and TIA, intolerant to statins, she is now intolerant to 3 statins including pravastatin atorvastatin and rosuvastatin. PCSK 9 inhibitors are too expensive option for her, she is on zetia her LDL has improved now 107, HDL 61, triglycerides 145.  5. Claudications - we will check B/L lower extremity Duplex  Follow up in 2 weeks.  Ena Dawley, MD, [facility] 05/26/2016, 9:43 AM

## 2016-05-26 NOTE — Telephone Encounter (Signed)
-----   Message from Dorothy Spark, MD sent at 05/26/2016  5:17 PM EST ----- Stress test doesn't show any signs of UTI, normal CBC, improved lipids, Tell her to increase her fluid intake to replete fluid from diarrhea. If her diarrhea doesn't improve next few days she should follow with her primary care physician. We'll order an echocardiogram to evaluate for her heart function.

## 2016-05-26 NOTE — Patient Instructions (Signed)
Medication Instructions:   Your physician recommends that you continue on your current medications as directed. Please refer to the Current Medication list given to you today.   Labwork:  TODAY---URINALYSIS, CMET, LIPIDS, CBC W DIFF, AND TROPONIN    Testing/Procedures:  Your physician has requested that you have a lower extremity arterial duplex. This test is an ultrasound of the arteries in the legs or arms. It looks at arterial blood flow in the legs and arms. Allow one hour for Lower and Upper Arterial scans. There are no restrictions or special instructions    Follow-Up:  2 WEEKS WITH AN EXTENDER IN OUR OFFICE       If you need a refill on your cardiac medications before your next appointment, please call your pharmacy.

## 2016-05-26 NOTE — Telephone Encounter (Signed)
Notified the pt that per Dr Meda Coffee, her labs showed that she doesn't show any signs of UTI, she had a normal CBC, and improved lipids. Informed the pt that per Dr Meda Coffee, she recommends that she increase her fluid intake to replete fluid from diarrhea, and if her diarrhea doesn't improve over the next few days,  she should follow with her primary care physician.  Informed the pt that per Dr Meda Coffee, she recommends that we order an echocardiogram to evaluate  her heart function. Informed the pt that I will place the order in the system, and have one of our [facility] schedulers call her back to arrange this appt.  Pt verbalized understanding and agrees with this plan.

## 2016-05-27 LAB — TROPONIN I

## 2016-06-14 ENCOUNTER — Ambulatory Visit (INDEPENDENT_AMBULATORY_CARE_PROVIDER_SITE_OTHER): Payer: Medicare Other | Admitting: Family Medicine

## 2016-06-14 ENCOUNTER — Encounter: Payer: Self-pay | Admitting: Family Medicine

## 2016-06-14 VITALS — BP 128/60 | HR 76 | Temp 97.5°F | Wt 131.8 lb

## 2016-06-14 DIAGNOSIS — I10 Essential (primary) hypertension: Secondary | ICD-10-CM | POA: Diagnosis not present

## 2016-06-14 DIAGNOSIS — R739 Hyperglycemia, unspecified: Secondary | ICD-10-CM | POA: Diagnosis not present

## 2016-06-14 DIAGNOSIS — E2839 Other primary ovarian failure: Secondary | ICD-10-CM

## 2016-06-14 LAB — POCT GLYCOSYLATED HEMOGLOBIN (HGB A1C): Hemoglobin A1C: 5.6

## 2016-06-14 NOTE — Progress Notes (Signed)
    Subjective:    Patient ID: Natalie [name], female    DOB: Dec 07, 1933, 82 y.o.   MRN: [id number]   Natalie [name] is here for follow up- she is unsure why this appointment was made for her. Was last seen in June. She is feeling well. Has no complaints other than typical aches and pains from arthritis. She follows with cardiology closely outpatient. She is taking medications as prescribed.   She is not interested in getting pneumonia vaccine. She had flu vaccine earlier in the season. She is open to getting Dexa scan. Denies any falls or weakness.    Smoking status reviewed- not current smoker  Review of Systems- denies chest pain, shortness of breath, weakness, recent illness   Objective:  BP 128/60   Pulse 76   Temp 97.5 F (36.4 C) (Oral)   Wt 131 lb 12.8 oz (59.8 kg)   SpO2 96%   BMI 20.64 kg/m  Vitals and nursing note reviewed  General: elderly lady, well nourished, in no acute distress HEENT: moist mucous membranes, good dentition without erythema or discharge noted in posterior oropharynx Cardiac: RRR, clear S1 and S2, no murmurs, rubs, or gallops Respiratory: clear to auscultation bilaterally, no increased work of breathing Abdomen: soft, nontender, nondistended Extremities: no edema or cyanosis. Neuro: alert and oriented, no focal deficits   Assessment & Plan:    Essential hypertension, benign At goal, BP today 128/60  -continue home medications -follow up with cardiology as scheduled  Pre-diabetes- last A1C 6.0 in 2014 Will check a1c today- 5.6 No need for intervention at this time Follow up in 6 months  Health Maintenance Due for PCV13- patient refused. Up to date on Flu vaccine Dexa scan ordered, information given to patient   Return in about 6 months (around 12/13/2016).   Lucila Maine, DO Family Medicine Resident PGY-1

## 2016-06-14 NOTE — Assessment & Plan Note (Signed)
At goal, BP today 128/60  -continue home medications -follow up with cardiology as scheduled

## 2016-06-20 ENCOUNTER — Encounter (HOSPITAL_COMMUNITY): Payer: Medicare Other

## 2016-06-21 ENCOUNTER — Ambulatory Visit (HOSPITAL_COMMUNITY): Payer: Medicare Other

## 2016-06-22 ENCOUNTER — Telehealth: Payer: Self-pay | Admitting: Cardiology

## 2016-06-22 ENCOUNTER — Other Ambulatory Visit (HOSPITAL_COMMUNITY): Payer: Medicare Other

## 2016-06-22 NOTE — Telephone Encounter (Signed)
Spoke with Natalie [name] [name] find out why she cancel the Echo/Arterial/cartiod and f/u with [facility].   She stated that she overreacted and is feeling better.  This is why she cancel the tests and f/u appointment.

## 2016-06-23 ENCOUNTER — Ambulatory Visit: Payer: Medicare Other | Admitting: Cardiology

## 2016-07-20 NOTE — Telephone Encounter (Signed)
Hi Megan, I have made multiple attempts to reach this subject, I spoke with her granddaughter and explained why I was calling.  Gay Filler made several attempts and left messages.  At this point I'm going to say no to Research.  Thanks!

## 2016-07-20 NOTE — Telephone Encounter (Signed)
Thank you for the effort and multiple attempts at reaching patient.

## 2016-08-17 ENCOUNTER — Encounter: Payer: Self-pay | Admitting: Internal Medicine

## 2016-08-17 ENCOUNTER — Ambulatory Visit (INDEPENDENT_AMBULATORY_CARE_PROVIDER_SITE_OTHER): Payer: Medicare Other | Admitting: Internal Medicine

## 2016-08-17 VITALS — BP 128/64 | HR 73 | Temp 97.9°F | Ht 67.0 in | Wt 131.0 lb

## 2016-08-17 DIAGNOSIS — J069 Acute upper respiratory infection, unspecified: Secondary | ICD-10-CM | POA: Diagnosis not present

## 2016-08-17 MED ORDER — GUAIFENESIN-DM 100-10 MG/5ML PO SYRP: 5.0000 mL | ORAL_SOLUTION | ORAL | 0 refills | Status: DC | PRN

## 2016-08-17 MED ORDER — BENZOCAINE 10 MG MT LOZG: LOZENGE | OROMUCOSAL | 0 refills | Status: DC

## 2016-08-17 MED ORDER — BENZONATATE 100 MG PO CAPS: 100.0000 mg | ORAL_CAPSULE | Freq: Two times a day (BID) | ORAL | 0 refills | Status: DC | PRN

## 2016-08-17 NOTE — Patient Instructions (Addendum)
I will provide you with lozenges for the sore throat. Medications for cough. You should improve over the next couple of days

## 2016-08-17 NOTE — Assessment & Plan Note (Addendum)
URI symptoms with physical exam consistent with viral process. Swollen right tonsil /tonsillith noted.  - Symptomatic treatment with tessalon Perles, bezocain lozenges, and Robitussin   - Follow up as need if no improvement

## 2016-08-17 NOTE — Progress Notes (Signed)
   [name] Kerrin Mo, MD Phone: [phone]  Reason For Visit: SDA for Cold   #URI Patient with history of acute sinusitis presenting with URI symptoms for 4 days. Indicates symptoms started with pressure in right ear. She states that over the weekend she developed sore throat and cough. She also has had nasal discharge. She has felt tired. She denies sick contacts. Has tried Tylenol. Patient denies having fever or chills. She denies having any muscle aches. Symptoms Fever: None  Headache: None  Sneezing: yes  Scratchy throat: none Muscle aches: none  Stiff neck:None  Shortness of breath: None  Rash: None  Sore throat or swollen glands: yes    Past Medical History Reviewed problem list.  Medications- reviewed and updated No additions to family history Social history- patient is a non- smoker  Objective: BP 128/64   Pulse 73   Temp 97.9 F (36.6 C) (Oral)   Ht 5\' 7"  (1.702 m)   Wt 131 lb (59.4 kg)   SpO2 95%   BMI 20.52 kg/m  Gen: NAD, alert, cooperative with exam HEENT: Normal    Neck: Positive for spotty lymphadenopathy     Nose: nasal turbinates swollen     Throat: Swollen right tonsil 2 +, tonsillith noted  Cardio: regular rate and rhythm, S1S2 heard, no murmurs appreciated Pulm: clear to auscultation bilaterally, no wheezes, rhonchi or rales Skin: dry, intact, no rashes or lesions  Assessment/Plan: See problem based a/p  Acute upper respiratory infection URI symptoms with physical exam consistent with viral process. Swollen right tonsil /tonsillith noted.  - Symptomatic treatment with tessalon Perles, bezocain lozenges, and Robitussin   - Follow up as need if no improvement

## 2016-08-26 ENCOUNTER — Encounter (HOSPITAL_COMMUNITY): Payer: Self-pay

## 2016-08-26 ENCOUNTER — Ambulatory Visit (HOSPITAL_COMMUNITY)
Admission: EM | Admit: 2016-08-26 | Discharge: 2016-08-26 | Disposition: A | Discharge status: Home / Self Care | Payer: Medicare Other | Attending: Family Medicine | Admitting: Family Medicine

## 2016-08-26 DIAGNOSIS — J40 Bronchitis, not specified as acute or chronic: Secondary | ICD-10-CM

## 2016-08-26 MED ORDER — ALBUTEROL SULFATE HFA 108 (90 BASE) MCG/ACT IN AERS: 1.0000 | INHALATION_SPRAY | Freq: Four times a day (QID) | RESPIRATORY_TRACT | 0 refills | Status: DC | PRN

## 2016-08-26 MED ORDER — AZITHROMYCIN 250 MG PO TABS: 250.0000 mg | ORAL_TABLET | Freq: Every day | ORAL | 0 refills | Status: DC

## 2016-08-26 NOTE — Discharge Instructions (Signed)
I am treating you today for acute bronchitis. I prescribed an antibiotic, azithromycin. Take 2 tablets today then 1 tablet daily until finished. I have also prescribed an albuterol inhaler, take 1-2 puffs every 4-6 hours as needed, for wheezing, or shortness of breath. I would encourage you to continue taking the cough medicine that was prescribed by your primary care doctor at your last visit. This medicine will help you symptoms. If your symptoms fail to improve or at any time if symptoms worsen follow up with your primary care provider, return to clinic, or to the emergency room as needed.

## 2016-08-26 NOTE — ED Provider Notes (Signed)
CSN: [id number]     Arrival date & time 08/26/16  1138 History   None    Chief Complaint  Patient presents with  . Shortness of Breath   (Consider location/radiation/quality/duration/timing/severity/associated sxs/prior Treatment) 83 year old female presents to clinic with 2 week history of cough. Worse at night, nonproductive, dry, hacking, with clear sputum. Was seen on February 2 by her primary care provider and diagnosed with a viral URI. She was started on Tessalon, Tussionex DM, and given benzocaine lozenges for sore throat. She has not had any improvement in her symptoms, states she has not been taking her cough medicine. She denies history of fever, nausea, diarrhea, or vomiting. Denies any sinus pain, or sinus pressure.   The history is provided by the patient.  Shortness of Breath    Past Medical History:  Diagnosis Date  . Back pain    with radiculopathy  . CAD (coronary artery disease)    prior DES Jan 2007 with low risk myoview in 2008  . Colon cancer ([place])    prior adenocarcinoma of cecum in 2000 s/p chemo and colon resection  . GERD (gastroesophageal reflux disease)   . Glaucoma suspect   . HTN (hypertension)   . Hyperlipidemia    Past Surgical History:  Procedure Laterality Date  . ABDOMINAL HYSTERECTOMY    . BACK SURGERY    . CORONARY STENT PLACEMENT  Jan 2007   DES to RCA  . Microdiscectomy     L4-L5   Family History  Problem Relation Age of Onset  . Stroke Father     Deceased, 54s  . Diabetes Brother   . Healthy Son    Social History  Substance Use Topics  . Smoking status: Former Smoker    Packs/day: 1.00    Years: 15.00    Types: Cigarettes    Quit date: 08/25/1996  . Smokeless tobacco: Never Used  . Alcohol use No   OB History    No data available     Review of Systems  Reason unable to perform ROS: as covered in HPI.  Respiratory: Positive for shortness of breath.   All other systems reviewed and are negative.   Allergies   Claritin [loratadine]; Crestor [rosuvastatin calcium]; Lipitor [atorvastatin]; Oxycontin [oxycodone hcl]; and Penicillins  Home Medications   Prior to Admission medications   Medication Sig Start Date End Date Taking? Authorizing Provider  amLODipine (NORVASC) 5 MG tablet Take 1 tablet (5 mg total) by mouth daily. 11/23/15  Yes Leone Brand, MD  clopidogrel (PLAVIX) 75 MG tablet Take 1 tablet (75 mg total) by mouth daily. 04/04/16  Yes Steve Rattler, DO  ezetimibe (ZETIA) 10 MG tablet Take 1 tablet (10 mg total) by mouth daily. 10/13/15  Yes Dorothy Spark, MD  metoprolol (LOPRESSOR) 50 MG tablet TAKE 1 TABLET (50 MG TOTAL) BY MOUTH 2 (TWO) TIMES DAILY. 02/23/16  Yes Dorothy Spark, MD  nitroGLYCERIN (NITROSTAT) 0.4 MG SL tablet Place 1 tablet (0.4 mg total) under the tongue every 5 (five) minutes as needed for chest pain. 10/02/15  Yes Dorothy Spark, MD  acetaminophen (TYLENOL) 650 MG CR tablet Take 650 mg by mouth every 8 (eight) hours as needed. For pain    Historical Provider, MD  albuterol (PROVENTIL HFA;VENTOLIN HFA) 108 (90 Base) MCG/ACT inhaler Inhale 1-2 puffs into the lungs every 6 (six) hours as needed for wheezing or shortness of breath. 08/26/16   Barnet Glasgow, NP  aspirin EC 81 MG tablet Take 81  mg by mouth daily.    Historical Provider, MD  azithromycin (ZITHROMAX) 250 MG tablet Take 1 tablet (250 mg total) by mouth daily. Take first 2 tablets together, then 1 every day until finished. 08/26/16   Barnet Glasgow, NP  Benzocaine 10 MG LOZG As needed for sore throat 08/17/16   Asiyah Cletis Media, MD  benzonatate (TESSALON) 100 MG capsule Take 1 capsule (100 mg total) by mouth 2 (two) times daily as needed for cough. 08/17/16   Asiyah Cletis Media, MD  enalapril (VASOTEC) 5 MG tablet Take 1 tablet (5 mg total) by mouth daily. Patient not taking: Reported on 05/26/2016 11/23/15   Leone Brand, MD  guaiFENesin-dextromethorphan [facility] DM) 100-10 MG/5ML syrup Take 5 mLs by  mouth every 4 (four) hours as needed for cough. 08/17/16   Asiyah Cletis Media, MD   Meds Ordered and Administered this Visit  Medications - No data to display  BP (!) 137/114 (BP Location: Left Arm)   Pulse 77   Temp 98.2 F (36.8 C) (Oral)   Resp 20   SpO2 100%  No data found.   Physical Exam  Constitutional: She is oriented to person, place, and time. She appears well-developed and well-nourished. She does not have a sickly appearance. She does not appear ill. No distress.  HENT:  Head: Normocephalic and atraumatic.  Right Ear: Tympanic membrane and external ear normal.  Left Ear: Tympanic membrane and external ear normal.  Nose: Nose normal. Right sinus exhibits no maxillary sinus tenderness and no frontal sinus tenderness. Left sinus exhibits no maxillary sinus tenderness and no frontal sinus tenderness.  Mouth/Throat: Uvula is midline, oropharynx is clear and moist and mucous membranes are normal. No oropharyngeal exudate. Tonsils are 1+ on the right. Tonsils are 1+ on the left. No tonsillar exudate.  Eyes: Pupils are equal, round, and reactive to light.  Neck: Normal range of motion. Neck supple. No JVD present.  Cardiovascular: Normal rate and regular rhythm.   Pulmonary/Chest: Effort normal. No respiratory distress. She has wheezes (Slight expiratory wheezes heard throughout all fields).  Abdominal: Soft. Bowel sounds are normal. She exhibits no distension. There is no tenderness. There is no guarding.  Lymphadenopathy:       Head (right side): No submental, no submandibular, no tonsillar and no preauricular adenopathy present.       Head (left side): No submental, no submandibular, no tonsillar and no preauricular adenopathy present.    She has no cervical adenopathy.  Neurological: She is alert and oriented to person, place, and time.  Skin: Skin is warm and dry. Capillary refill takes less than 2 seconds. She is not diaphoretic.  Psychiatric: She has a normal mood and  affect.  Nursing note and vitals reviewed.   Urgent Care Course     Procedures (including critical care time)  Labs Review Labs Reviewed - No data to display  Imaging Review No results found.   Visual Acuity Review  Right Eye Distance:   Left Eye Distance:   Bilateral Distance:    Right Eye Near:   Left Eye Near:    Bilateral Near:         MDM   1. Bronchitis   I am treating you today for acute bronchitis. I prescribed an antibiotic, azithromycin. Take 2 tablets today then 1 tablet daily until finished. I have also prescribed an albuterol inhaler, take 1-2 puffs every 4-6 hours as needed, for wheezing, or shortness of breath. I would encourage you to continue  taking the cough medicine that was prescribed by your primary care doctor at your last visit. This medicine will help you symptoms. If your symptoms fail to improve or at any time if symptoms worsen follow up with your primary care provider, return to clinic, or to the emergency room as needed.    Barnet Glasgow, NP 08/26/16 1309

## 2016-08-26 NOTE — ED Triage Notes (Signed)
Has been sick for 2-3 Weeks. SOB, productive Cough, chest congestion, headache, fatigue, ears itching, Hot/cold spells. Taking airborne and drinking liquids. Stopped taking the medication her pcp gave her. She went to the PCP a week ago and they gave her tessalon, benzocaine and tussionex

## 2016-09-26 ENCOUNTER — Telehealth: Payer: Self-pay | Admitting: Cardiology

## 2016-09-26 NOTE — Telephone Encounter (Signed)
Pt was seen in ER 2/16 and Dx with acute bronchitis.  She was treated with an antibiotic and reports it did help her s/s.  She was advised then to f/u with her PCP however she has not done this yet.  She is reporting the SOB has gotten some worse in the last 2 weeks.  She has a NP, dry, hacking cough per her report.  She does not have any chest pain today but reports she did yesterday while at church.  She also complaints of stomach pain and a decreased appetite, occasionally left arm pain, shooting pains in legs and a spot in her back that improves with changing positions.  Advised if having any activity chest pain, SOB, left arm pain she will need to be seen the in ED for evaluation.  She doesn't feel as though this is necessary.  Advised I will schedule for her to be evaluated in the office with NP/PA but pt refused stating she will call her PCP office first to be seen there.  Advised to c/b if further concerns.

## 2016-09-26 NOTE — Telephone Encounter (Signed)
Mrs. Hauswirth is calling because she is having shortness of breath , unable to do very much without being short of breath . Also have been having some chest pains which has been coming and going , seem to be consistent with indigestion. Like after she eats or have stress full conversation and when she gets  out of breath is when the chest pains happen . States its been going on for about a week. Please call

## 2016-09-27 ENCOUNTER — Ambulatory Visit (HOSPITAL_COMMUNITY)
Admission: RE | Admit: 2016-09-27 | Discharge: 2016-09-27 | Disposition: A | Discharge status: Home / Self Care | Payer: Medicare Other | Source: Ambulatory Visit | Attending: Family Medicine | Admitting: Family Medicine

## 2016-09-27 ENCOUNTER — Ambulatory Visit (INDEPENDENT_AMBULATORY_CARE_PROVIDER_SITE_OTHER): Payer: Medicare Other | Admitting: Family Medicine

## 2016-09-27 ENCOUNTER — Encounter: Payer: Self-pay | Admitting: Family Medicine

## 2016-09-27 VITALS — BP 132/76 | HR 73 | Wt 132.2 lb

## 2016-09-27 DIAGNOSIS — R0609 Other forms of dyspnea: Secondary | ICD-10-CM | POA: Diagnosis not present

## 2016-09-27 DIAGNOSIS — R079 Chest pain, unspecified: Secondary | ICD-10-CM | POA: Diagnosis not present

## 2016-09-27 DIAGNOSIS — R0600 Dyspnea, unspecified: Secondary | ICD-10-CM | POA: Insufficient documentation

## 2016-09-27 DIAGNOSIS — R9431 Abnormal electrocardiogram [ECG] [EKG]: Secondary | ICD-10-CM | POA: Insufficient documentation

## 2016-09-27 DIAGNOSIS — R0602 Shortness of breath: Secondary | ICD-10-CM

## 2016-09-27 MED ORDER — ALBUTEROL SULFATE HFA 108 (90 BASE) MCG/ACT IN AERS: 2.0000 | INHALATION_SPRAY | Freq: Four times a day (QID) | RESPIRATORY_TRACT | 1 refills | Status: DC | PRN

## 2016-09-27 NOTE — Progress Notes (Signed)
Subjective:     Patient ID: Natalie [name], female   DOB: 10-Jan-1934, 83 y.o.   MRN: [id number]  Shortness of Breath  This is a new problem. The current episode started more than 1 month ago (She has been having SOB for some few months, but worsened 1 week ago after she got over bronchitis). The problem occurs intermittently. The problem has been gradually worsening. Associated symptoms include orthopnea. Pertinent negatives include no leg swelling, PND or wheezing. The symptoms are aggravated by exercise. Associated symptoms comments: SOB is worse with exertion and improve with rest. At time she has chest pain which improves with taking Tums, she feels it is indigestion. Chest pain is usually also triggered by food. Cough improved. Risk factors: Past hx of tobacco use, quited 20 yrs ago. She has tried nothing for the symptoms. There is no history of allergies, asthma, bronchiolitis, COPD or a heart failure.  She contacted her cardiologist's office yesterday to discuss her symptoms with Dr. Ottie Glazier, she was advised to see her PCP instead per patient.   Current Outpatient Prescriptions on File Prior to Visit  Medication Sig Dispense Refill  . amLODipine (NORVASC) 5 MG tablet Take 1 tablet (5 mg total) by mouth daily. 90 tablet 1  . aspirin EC 81 MG tablet Take 81 mg by mouth daily.    . benzonatate (TESSALON) 100 MG capsule Take 1 capsule (100 mg total) by mouth 2 (two) times daily as needed for cough. 20 capsule 0  . clopidogrel (PLAVIX) 75 MG tablet Take 1 tablet (75 mg total) by mouth daily. 90 tablet 3  . enalapril (VASOTEC) 5 MG tablet Take 1 tablet (5 mg total) by mouth daily. 30 tablet 1  . ezetimibe (ZETIA) 10 MG tablet Take 1 tablet (10 mg total) by mouth daily. 30 tablet 11  . metoprolol (LOPRESSOR) 50 MG tablet TAKE 1 TABLET (50 MG TOTAL) BY MOUTH 2 (TWO) TIMES DAILY. 60 tablet 11  . nitroGLYCERIN (NITROSTAT) 0.4 MG SL tablet Place 1 tablet (0.4 mg total) under the tongue  every 5 (five) minutes as needed for chest pain. 25 tablet 4  . acetaminophen (TYLENOL) 650 MG CR tablet Take 650 mg by mouth every 8 (eight) hours as needed. For pain    . Benzocaine 10 MG LOZG As needed for sore throat 1 each 0  . guaiFENesin-dextromethorphan (ROBITUSSIN DM) 100-10 MG/5ML syrup Take 5 mLs by mouth every 4 (four) hours as needed for cough. (Patient not taking: Reported on 09/27/2016) 118 mL 0   No current facility-administered medications on file prior to visit.    Past Medical History:  Diagnosis Date  . Back pain    with radiculopathy  . CAD (coronary artery disease)    prior DES Jan 2007 with low risk myoview in 2008  . Colon cancer ([place])    prior adenocarcinoma of cecum in 2000 s/p chemo and colon resection  . GERD (gastroesophageal reflux disease)   . Glaucoma suspect   . HTN (hypertension)   . Hyperlipidemia      Review of Systems  Respiratory: Positive for shortness of breath. Negative for wheezing.   Cardiovascular: Positive for orthopnea. Negative for leg swelling and PND.       Vitals:   09/27/16 1057 09/27/16 1122  BP: 132/76   Pulse: 76 73  SpO2: 90% 95%  Weight: 132 lb 3.2 oz (60 kg)     Objective:   Physical Exam  Constitutional: She is oriented to person, place,  and time. She appears well-developed. No distress.  Cardiovascular: Normal rate, regular rhythm and normal heart sounds.   No murmur heard. Pulmonary/Chest: Effort normal and breath sounds normal. No respiratory distress. She has no wheezes. She has no rhonchi.  Abdominal: Soft. Bowel sounds are normal. She exhibits no distension and no mass. There is no tenderness.  Musculoskeletal: Normal range of motion. She exhibits no edema.  Neurological: She is alert and oriented to person, place, and time.  Nursing note and vitals reviewed.      Assessment:     SOB Atypical chest pain    Plan:     Check problem list

## 2016-09-27 NOTE — Assessment & Plan Note (Signed)
Currently without symptoms. She attributed to to heart burn. Symptoms improved with Tums. She is advised if she is currently having chest pain she need to go to the ED. EKG done and reviewed by me today. NSR @ 70 BPM, non-specifit ST and T wave changes, almost similar to previous. She is advised to contact her cardiologist for follow up given Cardiac risk factor. Strict ED return precaution discussed. She verbalized understanding. I will also give referral to her cardiologist' office in case she need one.

## 2016-09-27 NOTE — Patient Instructions (Addendum)
It was nice seeing you today. I am sorry about your SOB. EKG looks okay today. We will get chest xray and an ultrasound of your heart to further evaluate the cause of your breathing problem. I will also like you to schedule and appointment with Dr. Valentina Lucks for lung function test. Follow up with your PCP soon.  Please go to the ED in the event of chest pain or worsening SOB.

## 2016-09-27 NOTE — Assessment & Plan Note (Signed)
R/O CHF vs post viral syndrome vs COPD given hx of tobacco use. O2 Sat between 90-95% on RA. Does not appear fluid overloaded. Chest xray ordered. She is aware she needed to get this done at the hospital. ECHO ordered. Albuterol prescribed prn SOB. Schedule PFT appointment with Dr. Valentina Lucks. Advised ED visit if symptoms worsens. She verbalized understanding.

## 2016-09-27 NOTE — Telephone Encounter (Signed)
FYI to Dr. Nelson

## 2016-09-28 ENCOUNTER — Ambulatory Visit
Admission: RE | Admit: 2016-09-28 | Discharge: 2016-09-28 | Disposition: A | Discharge status: Home / Self Care | Payer: Medicare Other | Source: Ambulatory Visit | Attending: Family Medicine | Admitting: Family Medicine

## 2016-09-28 DIAGNOSIS — R0602 Shortness of breath: Secondary | ICD-10-CM

## 2016-09-28 DIAGNOSIS — R079 Chest pain, unspecified: Secondary | ICD-10-CM

## 2016-09-30 ENCOUNTER — Ambulatory Visit (INDEPENDENT_AMBULATORY_CARE_PROVIDER_SITE_OTHER): Payer: Medicare Other | Admitting: Pharmacist

## 2016-09-30 ENCOUNTER — Encounter: Payer: Self-pay | Admitting: Pharmacist

## 2016-09-30 DIAGNOSIS — R0609 Other forms of dyspnea: Secondary | ICD-10-CM | POA: Diagnosis not present

## 2016-09-30 MED ORDER — UMECLIDINIUM-VILANTEROL 62.5-25 MCG/INH IN AEPB: 1.0000 | INHALATION_SPRAY | Freq: Every day | RESPIRATORY_TRACT | 0 refills | Status: AC

## 2016-09-30 NOTE — Assessment & Plan Note (Signed)
Patient has been experiencing SOB for the past few months but it has worsened within the past 2 weeks following treatment for bronchitis with azithromycin on 08/26/2016. Spirometry evaluation reveals moderate obstructive lung disease corresponding to GOLD Classification B based on spirometry,  1 recent exacerbation in the past year and CAT score of 24, FEV1/FVC 37%. Post nebulized albuterol tx revealed minimal improvement. Patient reports that she felt better after albuterol treatment. Initiated Anoro 1 inhalation once a day. Provided 4 sample inhalers.  Educated patient on purpose, proper use, potential adverse effects including dry mouth.  Reviewed results of pulmonary function tests.  Pt verbalized understanding of results and education.  Written pt instructions provided.  F/U Clinic visit in one month.

## 2016-09-30 NOTE — Progress Notes (Signed)
Patient ID: Natalie [name], female   DOB: [birth year], 83 y.o.   MRN: [id number] Reviewed: Agree with Dr. Graylin Shiver documentation and management.

## 2016-09-30 NOTE — Patient Instructions (Signed)
Thank you for coming in today. Your lung function tests showed some decreased lung function. We have given you 4 Anoro inhalers  to help you breathe better. These inhalers will last you 28 days. Use the inhaler once a day by taking a fast, deep breath.   We will see you back in 1 month.

## 2016-09-30 NOTE — Progress Notes (Signed)
   S:    Patient arrives in good spirits with her husband Vernel.  Presents for lung function evaluation. Patient was referred on 09/27/2016. Patient was last seen by Dr. Gwendlyn Deutscher on 09/27/2016.  Patient reports breathing has improved since being treated for bronchitis on 08/26/2016, but rates her breathing as 6/10 compared to the best in the last 6 months. Patient started smoking [place], 7-8/day, in 1957 and quit in 1995. Within the past year, especially since having bronchitis, her breathing has gotten worse and she is not able to perform ADLs without having to sit down.   O: mMRC score= >2 CAT score= 24 See Documentation Flowsheet - CAT/COPD for complete symptom scoring.  See "scanned report" or Documentation Flowsheet (discrete results - PFTs) for  Spirometry results. Patient provided good effort while attempting spirometry.   Albuterol Neb  Lot# [id number]   Exp. 06/09/2018  Physical exam (preformed by Dr. Shawna Orleans): Clear to auscultation with diminished air movement bilaterally. No crackles, wheezes.   A/P: Patient has been experiencing SOB for the past few months but it has worsened within the past 2 weeks following treatment for bronchitis with azithromycin on 08/26/2016. Spirometry evaluation reveals moderate obstructive lung disease corresponding to GOLD Classification B based on spirometry,  1 recent exacerbation in the past year and CAT score of 24, FEV1/FVC 37%. Post nebulized albuterol tx revealed minimal improvement. Patient reports that she felt better after albuterol treatment. Initiated Anoro 1 inhalation once a day. Provided 4 sample inhalers.  Educated patient on purpose, proper use, potential adverse effects including dry mouth.  Reviewed results of pulmonary function tests.  Pt verbalized understanding of results and education.  Written pt instructions provided.  F/U Clinic visit in one month.   Total time in face to face counseling 60 minutes. Patient seen with Myrna Blazer, PharmD  Candidate, Ida Rogue, PharmD Candidate, Dr. Shawna Orleans, DO.

## 2016-10-12 ENCOUNTER — Other Ambulatory Visit (HOSPITAL_COMMUNITY): Payer: Medicare Other

## 2016-10-12 ENCOUNTER — Other Ambulatory Visit: Payer: Self-pay

## 2016-10-12 ENCOUNTER — Ambulatory Visit (HOSPITAL_COMMUNITY): Payer: Medicare Other | Attending: Internal Medicine

## 2016-10-12 DIAGNOSIS — I10 Essential (primary) hypertension: Secondary | ICD-10-CM | POA: Insufficient documentation

## 2016-10-12 DIAGNOSIS — Z85038 Personal history of other malignant neoplasm of large intestine: Secondary | ICD-10-CM | POA: Insufficient documentation

## 2016-10-12 DIAGNOSIS — E785 Hyperlipidemia, unspecified: Secondary | ICD-10-CM | POA: Diagnosis not present

## 2016-10-12 DIAGNOSIS — R0602 Shortness of breath: Secondary | ICD-10-CM | POA: Insufficient documentation

## 2016-10-12 DIAGNOSIS — I2510 Atherosclerotic heart disease of native coronary artery without angina pectoris: Secondary | ICD-10-CM | POA: Diagnosis not present

## 2016-10-12 DIAGNOSIS — Z8673 Personal history of transient ischemic attack (TIA), and cerebral infarction without residual deficits: Secondary | ICD-10-CM | POA: Insufficient documentation

## 2016-10-13 ENCOUNTER — Ambulatory Visit (INDEPENDENT_AMBULATORY_CARE_PROVIDER_SITE_OTHER): Payer: Medicare Other | Admitting: Cardiology

## 2016-10-13 ENCOUNTER — Encounter: Payer: Self-pay | Admitting: Cardiology

## 2016-10-13 ENCOUNTER — Telehealth: Payer: Self-pay | Admitting: Family Medicine

## 2016-10-13 VITALS — BP 124/62 | HR 61 | Ht 65.0 in | Wt 133.0 lb

## 2016-10-13 DIAGNOSIS — E785 Hyperlipidemia, unspecified: Secondary | ICD-10-CM | POA: Diagnosis not present

## 2016-10-13 DIAGNOSIS — I739 Peripheral vascular disease, unspecified: Secondary | ICD-10-CM

## 2016-10-13 DIAGNOSIS — I10 Essential (primary) hypertension: Secondary | ICD-10-CM

## 2016-10-13 DIAGNOSIS — I2510 Atherosclerotic heart disease of native coronary artery without angina pectoris: Secondary | ICD-10-CM | POA: Diagnosis not present

## 2016-10-13 NOTE — Telephone Encounter (Signed)
ECHO result discussed with patient. No further testing required at this time. SOB likely related to COPD. She stated she has Cards follow up this morning, she will ask further question regarding her ECHO if necessary. Continue COPD management F/U as needed.

## 2016-10-13 NOTE — Progress Notes (Signed)
Patient ID: Natalie [name], female   DOB: [birth year], 83 y.o.   MRN: [id number]    Patient Name: Natalie [name] Date of Encounter: 10/13/2016  Primary Care Provider:  Steve Rattler, DO Primary Cardiologist:  Ena Dawley  Problem List   Past Medical History:  Diagnosis Date  . Back pain    with radiculopathy  . CAD (coronary artery disease)    prior DES Jan 2007 with low risk myoview in 2008  . Colon cancer ([place])    prior adenocarcinoma of cecum in 2000 s/p chemo and colon resection  . GERD (gastroesophageal reflux disease)   . Glaucoma suspect   . HTN (hypertension)   . Hyperlipidemia    Past Surgical History:  Procedure Laterality Date  . ABDOMINAL HYSTERECTOMY    . BACK SURGERY    . CORONARY STENT PLACEMENT  Jan 2007   DES to RCA  . Microdiscectomy     L4-L5   Allergies  Allergies  Allergen Reactions  . Claritin [Loratadine] Other (See Comments)    Headaches, Sinus pain, Swelling of the sinuses  . Crestor [Rosuvastatin Calcium] Other (See Comments)    Muscle aches and pains   . Lipitor [Atorvastatin] Other (See Comments)    Muscle aches on 40 mg daily  . Oxycontin [Oxycodone Hcl] Nausea And Vomiting  . Penicillins Rash   HPI  Ms. Natalie [name] is a former patient of Dr Verl Blalock. She has a history of known CAD with remote PCI to the RCA back in 2007. Her last stress Myoview was in February 2013 which demonstrated an ejection fraction of 78% with no ischemia.  She was evaluated in October 2014 for TIA that presented with arm heaviness and numbness lasting less than 24 hours. MRI/MRA showed no acute stroke but did reveal microvascular changes. Carotid dopplers showed 0-39% stenosis bilaterally. Echo with no cardio embolic source but did note grade I diastolic dysfunction. Troponins x 2 were also negative. Patient was on aspirin 81 mg and Plavix on admission and these were continued.   The patient was also started on pravastatin as she didn't tolerate  lipitor in the past. She stopped pravastatin as well due to the muscle pain.  Today she reports indigestion that occurs after certain foods and is always  as she had not been on a statin previously. Patient complains of DOE especially after walking stairs. Her BP has been elevated for quite some time.  10/02/2015 - this is 6 months follow-up the patient is complaining that for the last 4 weeks she has been experiencing more dyspnea on exertion associated with chest tightness that resolves at rest. No palpitations or syncope no lower extremity edema orthopnea or paroxysmal nocturnal dyspnea.  The patient is compliant with her medications however she's not taking Crestor that was advised by lipid clinic because of muscle pain. She is also complaining of foul smelling urine and burning urination.  12/02/2015 - the patient is coming after 2 months, she underwent an exercise nuclear stress test that showed hyperdynamic LV function no scar or ischemia but hypertensive response to stress. She was started on low-dose of ACE inhibitor but developed significant side effects and was switched to amlodipine 2.5 mg daily that was later increased to 5 mg daily. The patient states that she starts feeling better, she in fact look 20 years younger and stays active and she is exercising with her grandkids. She denies any chest pain lower extremity edema no palpitations or syncope.  05/26/2016 - the patient  states that she hasn't been feeling well since last Friday when she developed diarrhea and nausea but no vomiting, she feels pain in her abdomen and has been experiencing frequent urination and feeling like when she had UTI in the past. She denies any chest pain or shortness of breath but just overall doesn't feel well. She denies any fever or chills. She has also been experiencing claudication and pain in her legs at night. She denies any palpitations or syncope.  10/13/2016 - the patient is coming after 4 months, she  states that since she recovered from bronchitis she has been feeling great, she denies any chest pain or shortness of breath she has no lower extremity edema, orthopnea or paroxysmal nocturnal dyspnea. She also denies palpitations or syncope. Her only complaints are legs that are always cold and her right leg hurts while walking.   Home Medications  Prior to Admission medications   Medication Sig Start Date End Date Taking? Authorizing Provider  acetaminophen (TYLENOL) 650 MG CR tablet Take 650 mg by mouth every 8 (eight) hours as needed. For pain   Yes Historical Provider, MD  aspirin EC 81 MG tablet Take 81 mg by mouth daily.   Yes Historical Provider, MD  clopidogrel (PLAVIX) 75 MG tablet Take 75 mg by mouth daily.   Yes Historical Provider, MD  metoprolol (LOPRESSOR) 50 MG tablet Take 1 tablet (50 mg total) by mouth 2 (two) times daily. 06/18/13  Yes Dorothy Spark, MD   Family History  Family History  Problem Relation Age of Onset  . Stroke Father     Deceased, 15s  . Diabetes Brother   . Healthy Son    Social History  Social History   Social History  . Marital status: Married    Spouse name: N/A  . Number of children: N/A  . Years of education: N/A   Occupational History  . Not on file.   Social History Main Topics  . Smoking status: Former Smoker    Packs/day: 0.50    Years: 38.00    Types: Cigarettes    Start date: 07/21/1955    Quit date: 08/25/1993  . Smokeless tobacco: Never Used  . Alcohol use No  . Drug use: No  . Sexual activity: Not on file   Other Topics Concern  . Not on file   Social History Narrative   She lives with husband, daughter, and grand daughter.   She is a retired Consulting civil engineer.    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     Review of Systems, as per HPI, otherwise negative General:  No chills, fever, night sweats or weight changes.  Cardiovascular:  No chest pain, dyspnea on  exertion, edema, orthopnea, palpitations, paroxysmal nocturnal dyspnea. Dermatological: No rash, lesions/masses Respiratory: No cough, dyspnea Urologic: No hematuria, dysuria Abdominal:   No nausea, vomiting, diarrhea, bright red blood per rectum, melena, or hematemesis Neurologic:  No visual changes, wkns, changes in mental status. All other systems reviewed and are otherwise negative except as noted above.  Physical Exam  Blood pressure 124/62, pulse 61, height 5\' 5"  (1.651 m), weight 133 lb (60.3 kg), SpO2 95 %.  General: Pleasant, NAD Psych: Normal affect. Neuro: Alert and oriented X 3. Moves all extremities spontaneously. HEENT: Normal  Neck: Supple without bruits or JVD. Lungs:  Resp regular and unlabored, CTA. Heart: RRR no s3, s4, or murmurs. Abdomen: Soft, non-tender, non-distended, BS + x  4.  Extremities: No clubbing, cyanosis or edema. DP/PT weak B/L /Radials 2+ and equal bilaterally.  Labs:  No results for input(s): CKTOTAL, CKMB, TROPONINI in the last 72 hours. Lab Results  Component Value Date   WBC 6.2 05/26/2016   HGB 11.6 (L) 05/26/2016   HCT 35.8 05/26/2016   MCV 90.6 05/26/2016   PLT 567 (H) 05/26/2016   No results for input(s): NA, K, CL, CO2, BUN, CREATININE, CALCIUM, PROT, BILITOT, ALKPHOS, ALT, AST, GLUCOSE in the last 168 hours.  Invalid input(s): LABALBU Lab Results  Component Value Date   CHOL 197 05/26/2016   HDL 61 05/26/2016   LDLCALC 107 (H) 05/26/2016   TRIG 145 05/26/2016    US Carotids: 05/08/2013 - The vertebral arteries appear patent with antegrade flow. - Findings consistent with 1-39 percent stenosis involving the right internal carotid artery and the left internal carotid artery. - ICA/CCA ratio. right =1.4. left = 1.39 Other specific details can be found in the table(s) above. Prepared and Electronically Authenticated by  Echocardiogram 10/12/2016  - Left ventricle: The cavity size was normal. Wall thickness was   normal.  Systolic function was vigorous. The estimated ejection   fraction was in the range of 65% to 70%. Wall motion was normal;   there were no regional wall motion abnormalities. Doppler   parameters are consistent with abnormal left ventricular   relaxation (grade 1 diastolic dysfunction). The E/e&' ratio is   between 8-15, suggesting indeterminate LV filling pressure. - Mitral valve: Calcified annulus. Mildly thickened leaflets .   There was trivial regurgitation. - Left atrium: The atrium was normal in size. - Inferior vena cava: The vessel was normal in size. The   respirophasic diameter changes were in the normal range (>= 50%),   consistent with normal central venous pressure.  Impressions: - LVEF 65-70%, normal awll thicknes, normal wall motion, grade 1 DD   with indeterminate LV filling pressure, trivial MR, normal LA   size, normal IVC.   Accessory Clinical Findings  TTE: 10/13/2015   Nuclear stress EF: 71%.  ST segment depression was noted during stress.  This is a low risk study.  The left ventricular ejection fraction is hyperdynamic (>65%).  Low risk stress nuclear study with a small, moderate intensity, fixed basal inferior lateral defect consistent with thinning; no ischemia; EF 71 with normal wall motion.  EKG performed today 05/26/2016 showed normal sinus rhythm, LVH with repolarization abnormalities and new Q-wave in lead 3 and aVF that was not previously present.    Assessment & Plan  1. Claudications - we'll check bilateral lower extremity arterial ultrasound.  2. CAD, s/p PCI to RCA, negative stress test in April 2017, hypertensive response to stress that was corrected and she feels improvement in her symptoms. She is currently asymptomatic. Most recent echocardiogram performed yesterday showed hyperdynamic LVEF no regional wall motion abnormalities.  3. Hypertension - controlled, continue the same regimen.  4. Hyperlipidemia - concerning especially with  h/o CAD and TIA, intolerant to statins, she is now intolerant to 3 statins including pravastatin atorvastatin and rosuvastatin. PCSK 9 inhibitors are too expensive option for her, she is on zetia her LDL has improved now 107, HDL 61, triglycerides 145. We will continue.  Follow up in 6 months.  Ena Dawley, MD, [facility] 10/13/2016, 11:09 AM

## 2016-10-13 NOTE — Patient Instructions (Signed)
Medication Instructions:  Your physician recommends that you continue on your current medications as directed. Please refer to the Current Medication list given to you today.   Labwork: None Ordered   Testing/Procedures: Your physician has requested that you have a lower extremity arterial duplex. This test is an ultrasound of the arteries in the legs or arms. It looks at arterial blood flow in the legs and arms. Allow one hour for Lower and Upper Arterial scans. There are no restrictions or special instructions   Follow-Up: Your physician wants you to follow-up in: 6 months with Dr. Meda Coffee.  You will receive a reminder letter in the mail two months in advance. If you don't receive a letter, please call our office to schedule the follow-up appointment.   If you need a refill on your cardiac medications before your next appointment, please call your pharmacy.   Thank you for choosing [facility]! Christen Bame, RN [phone]

## 2016-10-18 ENCOUNTER — Other Ambulatory Visit: Payer: Self-pay | Admitting: Cardiology

## 2016-10-18 DIAGNOSIS — I739 Peripheral vascular disease, unspecified: Secondary | ICD-10-CM

## 2016-10-19 ENCOUNTER — Other Ambulatory Visit: Payer: Self-pay | Admitting: Cardiology

## 2016-10-20 ENCOUNTER — Other Ambulatory Visit: Payer: Self-pay | Admitting: Family Medicine

## 2016-10-24 ENCOUNTER — Encounter: Payer: Self-pay | Admitting: Family Medicine

## 2016-11-01 ENCOUNTER — Ambulatory Visit (HOSPITAL_COMMUNITY)
Admission: RE | Admit: 2016-11-01 | Discharge: 2016-11-01 | Disposition: A | Discharge status: Home / Self Care | Payer: Medicare Other | Source: Ambulatory Visit | Attending: Cardiovascular Disease | Admitting: Cardiovascular Disease

## 2016-11-01 DIAGNOSIS — Z8673 Personal history of transient ischemic attack (TIA), and cerebral infarction without residual deficits: Secondary | ICD-10-CM | POA: Insufficient documentation

## 2016-11-01 DIAGNOSIS — I10 Essential (primary) hypertension: Secondary | ICD-10-CM | POA: Insufficient documentation

## 2016-11-01 DIAGNOSIS — E785 Hyperlipidemia, unspecified: Secondary | ICD-10-CM | POA: Insufficient documentation

## 2016-11-01 DIAGNOSIS — I739 Peripheral vascular disease, unspecified: Secondary | ICD-10-CM | POA: Diagnosis not present

## 2016-11-01 DIAGNOSIS — I2510 Atherosclerotic heart disease of native coronary artery without angina pectoris: Secondary | ICD-10-CM

## 2016-11-01 DIAGNOSIS — Z87891 Personal history of nicotine dependence: Secondary | ICD-10-CM | POA: Diagnosis not present

## 2016-11-17 ENCOUNTER — Emergency Department (HOSPITAL_COMMUNITY)
Admission: EM | Admit: 2016-11-17 | Discharge: 2016-11-18 | Disposition: A | Discharge status: Home / Self Care | Payer: Medicare Other | Attending: Emergency Medicine | Admitting: Emergency Medicine

## 2016-11-17 ENCOUNTER — Emergency Department (HOSPITAL_COMMUNITY): Payer: Medicare Other

## 2016-11-17 ENCOUNTER — Encounter (HOSPITAL_COMMUNITY): Payer: Self-pay | Admitting: Nurse Practitioner

## 2016-11-17 DIAGNOSIS — I10 Essential (primary) hypertension: Secondary | ICD-10-CM | POA: Insufficient documentation

## 2016-11-17 DIAGNOSIS — R42 Dizziness and giddiness: Secondary | ICD-10-CM | POA: Diagnosis not present

## 2016-11-17 DIAGNOSIS — Z8673 Personal history of transient ischemic attack (TIA), and cerebral infarction without residual deficits: Secondary | ICD-10-CM | POA: Diagnosis not present

## 2016-11-17 DIAGNOSIS — Z85038 Personal history of other malignant neoplasm of large intestine: Secondary | ICD-10-CM | POA: Diagnosis not present

## 2016-11-17 DIAGNOSIS — Z7982 Long term (current) use of aspirin: Secondary | ICD-10-CM | POA: Diagnosis not present

## 2016-11-17 DIAGNOSIS — Z955 Presence of coronary angioplasty implant and graft: Secondary | ICD-10-CM | POA: Insufficient documentation

## 2016-11-17 DIAGNOSIS — Z5181 Encounter for therapeutic drug level monitoring: Secondary | ICD-10-CM | POA: Insufficient documentation

## 2016-11-17 DIAGNOSIS — I2510 Atherosclerotic heart disease of native coronary artery without angina pectoris: Secondary | ICD-10-CM | POA: Diagnosis not present

## 2016-11-17 DIAGNOSIS — R202 Paresthesia of skin: Secondary | ICD-10-CM | POA: Diagnosis not present

## 2016-11-17 DIAGNOSIS — Z79899 Other long term (current) drug therapy: Secondary | ICD-10-CM | POA: Insufficient documentation

## 2016-11-17 DIAGNOSIS — Z87891 Personal history of nicotine dependence: Secondary | ICD-10-CM | POA: Diagnosis not present

## 2016-11-17 HISTORY — DX: Transient cerebral ischemic attack, unspecified: G45.9

## 2016-11-17 LAB — COMPREHENSIVE METABOLIC PANEL
ALK PHOS: 50 U/L (ref 38–126)
ALT: 14 U/L (ref 14–54)
AST: 21 U/L (ref 15–41)
Albumin: 4.3 g/dL (ref 3.5–5.0)
Anion gap: 7 (ref 5–15)
BUN: 12 mg/dL (ref 6–20)
CALCIUM: 9.1 mg/dL (ref 8.9–10.3)
CO2: 26 mmol/L (ref 22–32)
CREATININE: 0.84 mg/dL (ref 0.44–1.00)
Chloride: 106 mmol/L (ref 101–111)
GFR calc non Af Amer: >60 mL/min (ref >60)
Glucose, Bld: 87 mg/dL (ref 65–99)
Potassium: 3.5 mmol/L (ref 3.5–5.1)
SODIUM: 139 mmol/L (ref 135–145)
Total Bilirubin: <0.1 mg/dL — ABNORMAL LOW (ref 0.3–1.2)
Total Protein: 7.9 g/dL (ref 6.5–8.1)

## 2016-11-17 LAB — CBC
HEMATOCRIT: 37 % (ref 36.0–46.0)
HEMOGLOBIN: 11.8 g/dL — AB (ref 12.0–15.0)
MCH: 29.5 pg (ref 26.0–34.0)
MCHC: 31.9 g/dL (ref 30.0–36.0)
MCV: 92.5 fL (ref 78.0–100.0)
PLATELETS: 492 10*3/uL — AB (ref 150–400)
RBC: 4 MIL/uL (ref 3.87–5.11)
RDW: 14.8 % (ref 11.5–15.5)
WBC: 6 10*3/uL (ref 4.0–10.5)

## 2016-11-17 LAB — I-STAT TROPONIN, ED: Troponin i, poc: 0 ng/mL (ref 0.00–0.08)

## 2016-11-17 LAB — DIFFERENTIAL
Basophils Absolute: 0 10*3/uL (ref 0.0–0.1)
Basophils Relative: 0 %
Eosinophils Absolute: 0.1 10*3/uL (ref 0.0–0.7)
Eosinophils Relative: 2 %
LYMPHS PCT: 30 %
Lymphs Abs: 1.8 10*3/uL (ref 0.7–4.0)
MONO ABS: 0.4 10*3/uL (ref 0.1–1.0)
MONOS PCT: 6 %
NEUTROS ABS: 3.7 10*3/uL (ref 1.7–7.7)
Neutrophils Relative %: 62 %

## 2016-11-17 LAB — I-STAT CHEM 8, ED
BUN: 14 mg/dL (ref 6–20)
CHLORIDE: 105 mmol/L (ref 101–111)
CREATININE: 0.8 mg/dL (ref 0.44–1.00)
Calcium, Ion: 1.15 mmol/L (ref 1.15–1.40)
GLUCOSE: 80 mg/dL (ref 65–99)
HCT: 38 % (ref 36.0–46.0)
Hemoglobin: 12.9 g/dL (ref 12.0–15.0)
POTASSIUM: 3.5 mmol/L (ref 3.5–5.1)
Sodium: 142 mmol/L (ref 135–145)
TCO2: 27 mmol/L (ref 0–100)

## 2016-11-17 LAB — PROTIME-INR
INR: 0.93
Prothrombin Time: 12.5 seconds (ref 11.4–15.2)

## 2016-11-17 LAB — APTT: aPTT: 26 seconds (ref 24–36)

## 2016-11-17 LAB — CBG MONITORING, ED: Glucose-Capillary: 81 mg/dL (ref 65–99)

## 2016-11-17 MED ORDER — AMLODIPINE BESYLATE 5 MG PO TABS: 5.0000 mg | ORAL_TABLET | Freq: Every day | ORAL | 1 refills | Status: DC

## 2016-11-17 MED ORDER — AMLODIPINE BESYLATE 5 MG PO TABS
5.0000 mg | ORAL_TABLET | Freq: Once | ORAL | Status: AC
  Administered 2016-11-17: 5 mg via ORAL
  Filled 2016-11-17: qty 1

## 2016-11-17 NOTE — ED Notes (Signed)
Junius Creamer, NP at bedside at this time.

## 2016-11-17 NOTE — ED Provider Notes (Addendum)
[facility] DEPT Provider Note   CSN: [id number] Arrival date & time: 11/17/16  1708     History   Chief Complaint Chief Complaint  Patient presents with  . Stroke Symptoms    HPI Natalie [name] is a 83 y.o. female.  This is an 83 year old female with a history of hypertension, hyperlipidemia, CAD with cardiac stenting, GERD, and TIAs who states she was in her normal state of health until about 4:00 this afternoon when she acutely became dizzy.  Her vision became black or red, and she developed numbness and pain into her left hand that radiated to mid forearm.  This lasted approximately 20 minutes.  She states that this pain is different and the dizziness is different than anything.  She's had in the past. She recently had her antihypertensives changed by her primary care physician, who continues her on metoprolol but just continued her amlodipine this was approximately 3 months ago.  Denies any recent illnesses, trauma, no recent travel, shortness of breath or chest pain      Past Medical History:  Diagnosis Date  . Back pain    with radiculopathy  . CAD (coronary artery disease)    prior DES Jan 2007 with low risk myoview in 2008  . Colon cancer ([place])    prior adenocarcinoma of cecum in 2000 s/p chemo and colon resection  . GERD (gastroesophageal reflux disease)   . Glaucoma suspect   . HTN (hypertension)   . Hyperlipidemia   . TIA (transient ischemic attack)     Patient Active Problem List   Diagnosis Date Noted  . Dyspnea 09/27/2016  . Acute upper respiratory infection 08/17/2016  . Encounter for long-term (current) use of other high-risk medications 10/20/2015  . Neck and shoulder pain 06/19/2015  . Acute sinusitis 12/30/2014  . Atypical chest pain 09/02/2013  . GERD (gastroesophageal reflux disease) 09/02/2013  . TIA (transient ischemic attack) 05/08/2013  . Spine pain, lumbosacral 07/25/2011  . GERD 07/20/2009  . HERNIATED LUMBAR DISK WITH  RADICULOPATHY 05/18/2009  . RADICULOPATHY 05/14/2009  . CAD, NATIVE VESSEL 09/22/2008  . Colon cancer ([place]) 10/18/2007  . Hyperlipidemia 10/18/2007  . Essential hypertension, benign 10/18/2007    Past Surgical History:  Procedure Laterality Date  . ABDOMINAL HYSTERECTOMY    . BACK SURGERY    . CORONARY STENT PLACEMENT  Jan 2007   DES to RCA  . Microdiscectomy     L4-L5    OB History    No data available       Home Medications    Prior to Admission medications   Medication Sig Start Date End Date Taking? Authorizing Provider  acetaminophen (TYLENOL) 650 MG CR tablet Take 650 mg by mouth every 8 (eight) hours as needed. For pain    [provider]  albuterol (PROVENTIL HFA;VENTOLIN HFA) 108 (90 Base) MCG/ACT inhaler Inhale 2 puffs into the lungs every 6 (six) hours as needed for wheezing or shortness of breath. Patient not taking: Reported on 10/13/2016 09/27/16   Kinnie Feil, MD  amLODipine (NORVASC) 5 MG tablet Take 1 tablet (5 mg total) by mouth daily. 11/17/16   Junius Creamer, NP  amLODipine (NORVASC) 5 MG tablet Take 1 tablet (5 mg total) by mouth daily. 11/17/16   Junius Creamer, NP  aspirin EC 81 MG tablet Take 81 mg by mouth daily.    [provider]  clopidogrel (PLAVIX) 75 MG tablet Take 1 tablet (75 mg total) by mouth daily. 04/04/16   Riccio,  Angela C, DO  enalapril (VASOTEC) 5 MG tablet Take 1 tablet (5 mg total) by mouth daily. 11/23/15   Leone Brand, MD  ezetimibe (ZETIA) 10 MG tablet TAKE 1 TABLET (10 MG TOTAL) BY MOUTH DAILY. 10/19/16   Dorothy Spark, MD  metoprolol (LOPRESSOR) 50 MG tablet TAKE 1 TABLET (50 MG TOTAL) BY MOUTH 2 (TWO) TIMES DAILY. 02/23/16   Dorothy Spark, MD  nitroGLYCERIN (NITROSTAT) 0.4 MG SL tablet Place 1 tablet (0.4 mg total) under the tongue every 5 (five) minutes as needed for chest pain. 10/02/15   Dorothy Spark, MD  umeclidinium-vilanterol (ANORO ELLIPTA) 62.5-25 MCG/INH AEPB Inhale 1 puff into the lungs  daily. 09/30/16   Zenia Resides, MD    Family History Family History  Problem Relation Age of Onset  . Stroke Father        Deceased, 27s  . Diabetes Brother   . Healthy Son     Social History Social History  Substance Use Topics  . Smoking status: Former Smoker    Packs/day: 0.50    Years: 38.00    Types: Cigarettes    Start date: 07/21/1955    Quit date: 08/25/1993  . Smokeless tobacco: Never Used  . Alcohol use No     Allergies   Claritin [loratadine]; Crestor [rosuvastatin calcium]; Lipitor [atorvastatin]; Oxycontin [oxycodone hcl]; and Penicillins   Review of Systems Review of Systems  Constitutional: Negative for fever.  Eyes: Positive for visual disturbance.  Cardiovascular: Negative for chest pain and leg swelling.  Neurological: Positive for dizziness and numbness. Negative for light-headedness and headaches.  All other systems reviewed and are negative.    Physical Exam Updated Vital Signs BP (!) 181/98   Pulse 70   Temp 98.1 F (36.7 C) (Oral)   Resp 18   SpO2 97%   Physical Exam  Constitutional: She appears well-developed.  HENT:  Head: Normocephalic.  Neck: Normal range of motion.  Cardiovascular: Normal rate.   Pulmonary/Chest: Effort normal.  Abdominal: Soft.  Musculoskeletal: Normal range of motion. She exhibits no edema, tenderness or deformity.  Neurological: She is alert.  Skin: Skin is warm.  Psychiatric: She has a normal mood and affect.     ED Treatments / Results  Labs (all labs ordered are listed, but only abnormal results are displayed) Labs Reviewed  CBC - Abnormal; Notable for the following:       Result Value   Hemoglobin 11.8 (*)    Platelets 492 (*)    All other components within normal limits  COMPREHENSIVE METABOLIC PANEL - Abnormal; Notable for the following:    Total Bilirubin <0.1 (*)    All other components within normal limits  PROTIME-INR  APTT  DIFFERENTIAL  I-STAT TROPOININ, ED  CBG MONITORING, ED   I-STAT CHEM 8, ED    EKG  EKG Interpretation  Date/Time:  Thursday Nov 17 2016 18:04:49 EDT Ventricular Rate:  67 PR Interval:  114 QRS Duration: 76 QT Interval:  402 QTC Calculation: 424 R Axis:   63 Text Interpretation:  Normal sinus rhythm with sinus arrhythmia Possible Left atrial enlargement Nonspecific ST and T wave abnormality Abnormal ECG Confirmed by ALLEN  MD, ANTHONY ([id number]) on 11/17/2016 11:43:20 PM       Radiology No results found.  Procedures Procedures (including critical care time)  Medications Ordered in ED Medications  amLODipine (NORVASC) tablet 5 mg (5 mg Oral Given 11/17/16 2357)     Initial Impression / Assessment and Plan /  ED Course  I have reviewed the triage vital signs and the nursing notes.  Pertinent labs & imaging results that were available during my care of the patient were reviewed by me and considered in my medical decision making (see chart for details).    CT scan and MRI are negative for acute stroke.  She has been persistently hypertensive in the emergency department, I feel it would be in this patient's best interest to restart her Norvasc 5 mg daily.  She'll be given her first dose in the emergency department and prescription for 30 day supply.  She is to follow-up with her PCP within one week    Final Clinical Impressions(s) / ED Diagnoses   Final diagnoses:  Dizzy  Paresthesia  Hypertension, unspecified type    New Prescriptions Discharge Medication List as of 11/17/2016 11:55 PM       Junius Creamer, NP 11/17/16 2125    Lacretia Leigh, MD 11/17/16 2344    Lacretia Leigh, MD 11/17/16 2345    Junius Creamer, NP 11/17/16 2357    Junius Creamer, NP 12/11/16 1954    Lacretia Leigh, MD 12/12/16 2339

## 2016-11-17 NOTE — ED Notes (Signed)
Patient currently in MRI.

## 2016-11-17 NOTE — Discharge Instructions (Signed)
Tonight your evaluated for strokelike symptoms with this has been ruled out with CT scan and MRI.  I feel that you need better blood pressure control.  I suggest that she go back on your Norvasc 5 mg tablet daily and make an appointment with your PCP for next week for follow-up

## 2016-11-17 NOTE — ED Triage Notes (Signed)
Pt presents with c/o stroke symptoms. She reports about 4pm this afternoon she experienced numbness in her L hand, blurred vision, dizziness. Her symptoms lasted for several minutes and slowly resolved. She denies any weakness, speech difficulty, CP, SOB. She feels better now. She has a history of TIA.

## 2016-12-20 ENCOUNTER — Encounter: Payer: Self-pay | Admitting: *Deleted

## 2016-12-20 ENCOUNTER — Ambulatory Visit (INDEPENDENT_AMBULATORY_CARE_PROVIDER_SITE_OTHER): Payer: Medicare Other | Admitting: *Deleted

## 2016-12-20 DIAGNOSIS — Z23 Encounter for immunization: Secondary | ICD-10-CM | POA: Diagnosis not present

## 2016-12-20 DIAGNOSIS — Z0000 Encounter for general adult medical examination without abnormal findings: Secondary | ICD-10-CM

## 2016-12-20 NOTE — Progress Notes (Signed)
Subjective:   Natalie [name] is a 83 y.o. female who presents for an Initial Medicare Annual Wellness Visit.   Cardiac Risk Factors include: advanced age (>97men, >45 women);dyslipidemia;hypertension;smoking/ tobacco exposure     Objective:    Today's Vitals   12/20/16 1451 12/20/16 1452  BP:  (!) 146/60  Pulse:  70  Temp:  97.6 F (36.4 C)  TempSrc:  Oral  SpO2:  96%  Weight: 133 lb 6.4 oz (60.5 kg)   Height: 5\' 5"  (1.651 m)   PainSc:  6    Body mass index is 22.2 kg/m.   Current Medications (verified) Outpatient Encounter Prescriptions as of 12/20/2016  Medication Sig  . acetaminophen (TYLENOL) 650 MG CR tablet Take 650 mg by mouth every 8 (eight) hours as needed. For pain  . amLODipine (NORVASC) 5 MG tablet Take 1 tablet (5 mg total) by mouth daily.  Marland Kitchen aspirin EC 81 MG tablet Take 81 mg by mouth daily.  . clopidogrel (PLAVIX) 75 MG tablet Take 1 tablet (75 mg total) by mouth daily.  Marland Kitchen ezetimibe (ZETIA) 10 MG tablet TAKE 1 TABLET (10 MG TOTAL) BY MOUTH DAILY.  . metoprolol (LOPRESSOR) 50 MG tablet TAKE 1 TABLET (50 MG TOTAL) BY MOUTH 2 (TWO) TIMES DAILY.  . nitroGLYCERIN (NITROSTAT) 0.4 MG SL tablet Place 1 tablet (0.4 mg total) under the tongue every 5 (five) minutes as needed for chest pain.  Marland Kitchen umeclidinium-vilanterol (ANORO ELLIPTA) 62.5-25 MCG/INH AEPB Inhale 1 puff into the lungs daily.  Marland Kitchen albuterol (PROVENTIL HFA;VENTOLIN HFA) 108 (90 Base) MCG/ACT inhaler Inhale 2 puffs into the lungs every 6 (six) hours as needed for wheezing or shortness of breath. (Patient not taking: Reported on 12/20/2016)  . enalapril (VASOTEC) 5 MG tablet Take 1 tablet (5 mg total) by mouth daily. (Patient not taking: Reported on 12/20/2016)  . [DISCONTINUED] amLODipine (NORVASC) 5 MG tablet Take 1 tablet (5 mg total) by mouth daily.   No facility-administered encounter medications on file as of 12/20/2016.     Allergies (verified) Claritin [loratadine]; Crestor [rosuvastatin  calcium]; Lipitor [atorvastatin]; Oxycontin [oxycodone hcl]; and Penicillins   History: Past Medical History:  Diagnosis Date  . Back pain    with radiculopathy  . CAD (coronary artery disease)    prior DES Jan 2007 with low risk myoview in 2008  . Colon cancer ([place])    prior adenocarcinoma of cecum in 2000 s/p chemo and colon resection  . GERD (gastroesophageal reflux disease)   . Glaucoma suspect   . HTN (hypertension)   . Hyperlipidemia   . TIA (transient ischemic attack)    Past Surgical History:  Procedure Laterality Date  . ABDOMINAL HYSTERECTOMY    . BACK SURGERY    . CORONARY STENT PLACEMENT  Jan 2007   DES to RCA  . Microdiscectomy     L4-L5   Family History  Problem Relation Age of Onset  . Stroke Father        Deceased, 72s  . Diabetes Brother   . Healthy Son    Social History   Occupational History  . Not on file.   Social History Main Topics  . Smoking status: Former Smoker    Packs/day: 0.50    Years: 38.00    Types: Cigarettes    Start date: 07/21/1955    Quit date: 08/25/1993  . Smokeless tobacco: Never Used  . Alcohol use No  . Drug use: No  . Sexual activity: Yes    Partners:  Male    Tobacco Counseling Counseling given: Yes Patient is former smoker with no plans to restart   Activities of Daily Living In your present state of health, do you have any difficulty performing the following activities: 12/20/2016  Hearing? Y  Vision? Y  Difficulty concentrating or making decisions? N  Walking or climbing stairs? N  Dressing or bathing? N  Doing errands, shopping? N  Preparing Food and eating ? N  Using the Toilet? N  In the past six months, have you accidently leaked urine? Y  Do you have problems with loss of bowel control? Y  Managing your Medications? N  Managing your Finances? N  Housekeeping or managing your Housekeeping? N  Some recent data might be hidden   Home Safety:  My home has a working smoke alarm:  Yes X 2             My home throw rugs have been fastened down to the floor or removed:  Non-slip backs I have a non-slip surface or non-slip mats in the bathtub and shower:  No, will buy mat         All my home's stairs have handrails, including any outdoor stairs  Two level home with handrails inside and 3 outside steps without handrails. Patient has fallen down these steps while carrying load. Discussed having handrails installed and keeping arms free to hold on.         My home's floors, stairs and hallways are free from clutter, wires and cords:  Yes     I have animals in my home  No I wear seatbelts consistently:  Yes   Immunizations and Health Maintenance Immunization History  Administered Date(s) Administered  . Influenza Split 06/06/2011, 06/01/2012  . Influenza Whole 07/20/2009, 05/17/2010  . Influenza,inj,Quad PF,36+ Mos 05/06/2013, 04/21/2014, 06/15/2015, 04/11/2016  . Pneumococcal Polysaccharide-23 06/01/2012   Health Maintenance Due  Topic Date Due  . TETANUS/TDAP  08/29/1952  . DEXA SCAN  08/29/2000  . PNA vac Low Risk Adult (2 of 2 - PCV13) 06/01/2013  Patient will obtain TDaP at local pharmacy Order for Dexa previously placed by PCP. [facility] Dexa [facility] and screening Mammogram. Contact info given. Prevnar administered today  Patient Care Team: Steve Rattler, DO as PCP - General Rutherford Guys, MD as Consulting Physician (Ophthalmology) Dorothy Spark, MD as Consulting Physician (Cardiology)  Indicate any recent Medical Services you may have received from other than [facility] providers in the past year (date may be approximate).     Assessment:   This is a routine wellness examination for Natalie [name].   Hearing/Vision screen  Hearing Screening   Method: Audiometry   125Hz  250Hz  500Hz  1000Hz  2000Hz  3000Hz  4000Hz  6000Hz  8000Hz   Right ear:   40 Fail 40  40    Left ear:   40 40 40  40      Dietary issues and exercise activities  discussed: Current Exercise Habits: Home exercise routine, Type of exercise: walking, Time (Minutes): 30, Frequency (Times/Week): 7, Weekly Exercise (Minutes/Week): 210, Intensity: Moderate, Exercise limited by: orthopedic condition(s) (Leg cramping)  Goals    . [place] through prayer (pt-stated)      Depression Screen PHQ 2/9 Scores 12/20/2016 09/27/2016 08/17/2016 06/14/2016 11/23/2015 06/18/2015 12/30/2014  PHQ - 2 Score 0 0 0 0 1 0 0    Fall Risk Fall Risk  12/20/2016 09/27/2016 08/17/2016 06/14/2016 11/23/2015  Falls in the past year? No No No No No  Risk for fall due to : Impaired balance/gait - - - -   TUG Test:  Done in 12 seconds. Falls prevention discussed in detail and literature given.  Cognitive Function: Mini-Cog  Passed with score 4/5   Screening Tests Health Maintenance  Topic Date Due  . TETANUS/TDAP  08[birth year]29/1952  . DEXA SCAN  08/29/2000  . PNA vac Low Risk Adult (2 of 2 - PCV13) 06/01/2013  . INFLUENZA VACCINE  02/08/2017      Plan:    Patient restarted amlodipine 5 mg after Urgent Care visit on 11/17/16. BP had been elevated to 203/100 at that time. BP 146/60 today  Patient c/o intermittent LLQ pain X 3 weeks. Encouraged patient to schedule f/u with PCP to discuss  I have personally reviewed and noted the following in the patient's chart:   . Medical and social history . Use of alcohol, tobacco or illicit drugs  . Current medications and supplements . Functional ability and status . Nutritional status . Physical activity . Advanced directives . List of other physicians . Hospitalizations, surgeries, and ER visits in previous 12 months . Vitals . Screenings to include cognitive, depression, and falls . Referrals and appointments  In addition, I have reviewed and discussed with patient certain preventive protocols, quality metrics, and best practice recommendations. A written personalized care plan for preventive services as well as general preventive health  recommendations were provided to patient.     Velora Heckler, RN   12/20/2016

## 2016-12-20 NOTE — Patient Instructions (Addendum)
Natalie [name],  Thank you for taking time to come for yourMedicare Wellness Visit. I appreciate your ongoing commitment to your health goals. Please review the following plan we discussed and let me know if I can assist you in the future.   These are the goals we discussed:  Goals    . [place] through prayer (pt-stated)         Fall Prevention in the Home Falls can cause injuries. They can happen to people of all ages. There are many things you can do to make your home safe and to help prevent falls. What can I do on the outside of my home?  Regularly fix the edges of walkways and driveways and fix any cracks.  Remove anything that might make you trip as you walk through a door, such as a raised step or threshold.  Trim any bushes or trees on the path to your home.  Use bright outdoor lighting.  Clear any walking paths of anything that might make someone trip, such as rocks or tools.  Regularly check to see if handrails are loose or broken. Make sure that both sides of any steps have handrails.  Any raised decks and porches should have guardrails on the edges.  Have any leaves, snow, or ice cleared regularly.  Use sand or salt on walking paths during winter.  Clean up any spills in your garage right away. This includes oil or grease spills. What can I do in the bathroom?  Use night lights.  Install grab bars by the toilet and in the tub and shower. Do not use towel bars as grab bars.  Use non-skid mats or decals in the tub or shower.  If you need to sit down in the shower, use a plastic, non-slip stool.  Keep the floor dry. Clean up any water that spills on the floor as soon as it happens.  Remove soap buildup in the tub or shower regularly.  Attach bath mats securely with double-sided non-slip rug tape.  Do not have throw rugs and other things on the floor that can make you trip. What can I do in the bedroom?  Use night lights.  Make sure that you have  a light by your bed that is easy to reach.  Do not use any sheets or blankets that are too big for your bed. They should not hang down onto the floor.  Have a firm chair that has side arms. You can use this for support while you get dressed.  Do not have throw rugs and other things on the floor that can make you trip. What can I do in the kitchen?  Clean up any spills right away.  Avoid walking on wet floors.  Keep items that you use a lot in easy-to-reach places.  If you need to reach something above you, use a strong step stool that has a grab bar.  Keep electrical cords out of the way.  Do not use floor polish or wax that makes floors slippery. If you must use wax, use non-skid floor wax.  Do not have throw rugs and other things on the floor that can make you trip. What can I do with my stairs?  Do not leave any items on the stairs.  Make sure that there are handrails on both sides of the stairs and use them. Fix handrails that are broken or loose. Make sure that handrails are as long as the stairways.  Check any carpeting to make  sure that it is firmly attached to the stairs. Fix any carpet that is loose or worn.  Avoid having throw rugs at the top or bottom of the stairs. If you do have throw rugs, attach them to the floor with carpet tape.  Make sure that you have a light switch at the top of the stairs and the bottom of the stairs. If you do not have them, ask someone to add them for you. What else can I do to help prevent falls?  Wear shoes that: ? Do not have high heels. ? Have rubber bottoms. ? Are comfortable and fit you well. ? Are closed at the toe. Do not wear sandals.  If you use a stepladder: ? Make sure that it is fully opened. Do not climb a closed stepladder. ? Make sure that both sides of the stepladder are locked into place. ? Ask someone to hold it for you, if possible.  Clearly mark and make sure that you can see: ? Any grab bars or  handrails. ? First and last steps. ? Where the edge of each step is.  Use tools that help you move around (mobility aids) if they are needed. These include: ? Canes. ? Walkers. ? Scooters. ? Crutches.  Turn on the lights when you go into a dark area. Replace any light bulbs as soon as they burn out.  Set up your furniture so you have a clear path. Avoid moving your furniture around.  If any of your floors are uneven, fix them.  If there are any pets around you, be aware of where they are.  Review your medicines with your doctor. Some medicines can make you feel dizzy. This can increase your chance of falling. Ask your doctor what other things that you can do to help prevent falls. This information is not intended to replace advice given to you by your health care provider. Make sure you discuss any questions you have with your health care provider. Document Released: 04/23/2009 Document Revised: 12/03/2015 Document Reviewed: 08/01/2014 Elsevier Interactive Patient Education  2018 [place] Maintenance, Female Adopting a healthy lifestyle and getting preventive care can go a long way to promote health and wellness. Talk with your health care provider about what schedule of regular examinations is right for you. This is a good chance for you to check in with your provider about disease prevention and staying healthy. In between checkups, there are plenty of things you can do on your own. Experts have done a lot of research about which lifestyle changes and preventive measures are most likely to keep you healthy. Ask your health care provider for more information. Weight and diet Eat a healthy diet  Be sure to include plenty of vegetables, fruits, low-fat dairy products, and lean protein.  Do not eat a lot of foods high in solid fats, added sugars, or salt.  Get regular exercise. This is one of the most important things you can do for your health. ? Most adults should  exercise for at least 150 minutes each week. The exercise should increase your heart rate and make you sweat (moderate-intensity exercise). ? Most adults should also do strengthening exercises at least twice a week. This is in addition to the moderate-intensity exercise.  Maintain a healthy weight  Body mass index (BMI) is a measurement that can be used to identify possible weight problems. It estimates body fat based on height and weight. Your health care provider can help determine your  BMI and help you achieve or maintain a healthy weight.  For females 8 years of age and older: ? A BMI below 18.5 is considered underweight. ? A BMI of 18.5 to 24.9 is normal. ? A BMI of 25 to 29.9 is considered overweight. ? A BMI of 30 and above is considered obese.  Watch levels of cholesterol and blood lipids  You should start having your blood tested for lipids and cholesterol at 81 years of age, then have this test every 5 years.  You may need to have your cholesterol levels checked more often if: ? Your lipid or cholesterol levels are high. ? You are older than 81 years of age. ? You are at high risk for heart disease.  Cancer screening Lung Cancer  Lung cancer screening is recommended for adults 55-71 years old who are at high risk for lung cancer because of a history of smoking.  A yearly low-dose CT scan of the lungs is recommended for people who: ? Currently smoke. ? Have quit within the past 15 years. ? Have at least a 30-pack-year history of smoking. A pack year is smoking an average of one pack of cigarettes a day for 1 year.  Yearly screening should continue until it has been 15 years since you quit.  Yearly screening should stop if you develop a health problem that would prevent you from having lung cancer treatment.  Breast Cancer  Practice breast self-awareness. This means understanding how your breasts normally appear and feel.  It also means doing regular breast  self-exams. Let your health care provider know about any changes, no matter how small.  If you are in your 20s or 30s, you should have a clinical breast exam (CBE) by a health care provider every 1-3 years as part of a regular health exam.  If you are 30 or older, have a CBE every year. Also consider having a breast X-ray (mammogram) every year.  If you have a family history of breast cancer, talk to your health care provider about genetic screening.  If you are at high risk for breast cancer, talk to your health care provider about having an MRI and a mammogram every year.  Breast cancer gene (BRCA) assessment is recommended for women who have family members with BRCA-related cancers. BRCA-related cancers include: ? Breast. ? Ovarian. ? Tubal. ? Peritoneal cancers.  Results of the assessment will determine the need for genetic counseling and BRCA1 and BRCA2 testing.  Cervical Cancer Your health care provider may recommend that you be screened regularly for cancer of the pelvic organs (ovaries, uterus, and vagina). This screening involves a pelvic examination, including checking for microscopic changes to the surface of your cervix (Pap test). You may be encouraged to have this screening done every 3 years, beginning at age 30.  For women ages 17-65, health care providers may recommend pelvic exams and Pap testing every 3 years, or they may recommend the Pap and pelvic exam, combined with testing for human papilloma virus (HPV), every 5 years. Some types of HPV increase your risk of cervical cancer. Testing for HPV may also be done on women of any age with unclear Pap test results.  Other health care providers may not recommend any screening for nonpregnant women who are considered low risk for pelvic cancer and who do not have symptoms. Ask your health care provider if a screening pelvic exam is right for you.  If you have had past treatment for cervical cancer or  a condition that could  lead to cancer, you need Pap tests and screening for cancer for at least 20 years after your treatment. If Pap tests have been discontinued, your risk factors (such as having a new sexual partner) need to be reassessed to determine if screening should resume. Some women have medical problems that increase the chance of getting cervical cancer. In these cases, your health care provider may recommend more frequent screening and Pap tests.  Colorectal Cancer  This type of cancer can be detected and often prevented.  Routine colorectal cancer screening usually begins at 81 years of age and continues through 81 years of age.  Your health care provider may recommend screening at an earlier age if you have risk factors for colon cancer.  Your health care provider may also recommend using home test kits to check for hidden blood in the stool.  A small camera at the end of a tube can be used to examine your colon directly (sigmoidoscopy or colonoscopy). This is done to check for the earliest forms of colorectal cancer.  Routine screening usually begins at age 72.  Direct examination of the colon should be repeated every 5-10 years through 81 years of age. However, you may need to be screened more often if early forms of precancerous polyps or small growths are found.  Skin Cancer  Check your skin from head to toe regularly.  Tell your health care provider about any new moles or changes in moles, especially if there is a change in a mole's shape or color.  Also tell your health care provider if you have a mole that is larger than the size of a pencil eraser.  Always use sunscreen. Apply sunscreen liberally and repeatedly throughout the day.  Protect yourself by wearing long sleeves, pants, a wide-brimmed hat, and sunglasses whenever you are outside.  Heart disease, diabetes, and high blood pressure  High blood pressure causes heart disease and increases the risk of stroke. High blood pressure  is more likely to develop in: ? People who have blood pressure in the high end of the normal range (130-139/85-89 mm Hg). ? People who are overweight or obese. ? People who are African American.  If you are 50-77 years of age, have your blood pressure checked every 3-5 years. If you are 18 years of age or older, have your blood pressure checked every year. You should have your blood pressure measured twice-once when you are at a hospital or clinic, and once when you are not at a hospital or clinic. Record the average of the two measurements. To check your blood pressure when you are not at a hospital or clinic, you can use: ? An automated blood pressure machine at a pharmacy. ? A home blood pressure monitor.  If you are between 42 years and 24 years old, ask your health care provider if you should take aspirin to prevent strokes.  Have regular diabetes screenings. This involves taking a blood sample to check your fasting blood sugar level. ? If you are at a normal weight and have a low risk for diabetes, have this test once every three years after 81 years of age. ? If you are overweight and have a high risk for diabetes, consider being tested at a younger age or more often. Preventing infection Hepatitis B  If you have a higher risk for hepatitis B, you should be screened for this virus. You are considered at high risk for hepatitis B if: ?  You were born in a country where hepatitis B is common. Ask your health care provider which countries are considered high risk. ? Your parents were born in a high-risk country, and you have not been immunized against hepatitis B (hepatitis B vaccine). ? You have HIV or AIDS. ? You use needles to inject street drugs. ? You live with someone who has hepatitis B. ? You have had sex with someone who has hepatitis B. ? You get hemodialysis treatment. ? You take certain medicines for conditions, including cancer, organ transplantation, and autoimmune  conditions.  Hepatitis C  Blood testing is recommended for: ? Everyone born from [birth year] through 1963. ? Anyone with known risk factors for hepatitis C.  Sexually transmitted infections (STIs)  You should be screened for sexually transmitted infections (STIs) including gonorrhea and chlamydia if: ? You are sexually active and are younger than 81 years of age. ? You are older than 81 years of age and your health care provider tells you that you are at risk for this type of infection. ? Your sexual activity has changed since you were last screened and you are at an increased risk for chlamydia or gonorrhea. Ask your health care provider if you are at risk.  If you do not have HIV, but are at risk, it may be recommended that you take a prescription medicine daily to prevent HIV infection. This is called pre-exposure prophylaxis (PrEP). You are considered at risk if: ? You are sexually active and do not regularly use condoms or know the HIV status of your partner(s). ? You take drugs by injection. ? You are sexually active with a partner who has HIV.  Talk with your health care provider about whether you are at high risk of being infected with HIV. If you choose to begin PrEP, you should first be tested for HIV. You should then be tested every 3 months for as long as you are taking PrEP. Pregnancy  If you are premenopausal and you may become pregnant, ask your health care provider about preconception counseling.  If you may become pregnant, take 400 to 800 micrograms (mcg) of folic acid every day.  If you want to prevent pregnancy, talk to your health care provider about birth control (contraception). Osteoporosis and menopause  Osteoporosis is a disease in which the bones lose minerals and strength with aging. This can result in serious bone fractures. Your risk for osteoporosis can be identified using a bone density scan.  If you are 27 years of age or older, or if you are at risk for  osteoporosis and fractures, ask your health care provider if you should be screened.  Ask your health care provider whether you should take a calcium or vitamin D supplement to lower your risk for osteoporosis.  Menopause may have certain physical symptoms and risks.  Hormone replacement therapy may reduce some of these symptoms and risks. Talk to your health care provider about whether hormone replacement therapy is right for you. Follow these instructions at home:  Schedule regular health, dental, and eye exams.  Stay current with your immunizations.  Do not use any tobacco products including cigarettes, chewing tobacco, or electronic cigarettes.  If you are pregnant, do not drink alcohol.  If you are breastfeeding, limit how much and how often you drink alcohol.  Limit alcohol intake to no more than 1 drink per day for nonpregnant women. One drink equals 12 ounces of beer, 5 ounces of wine, or 1 ounces of  hard liquor.  Do not use street drugs.  Do not share needles.  Ask your health care provider for help if you need support or information about quitting drugs.  Tell your health care provider if you often feel depressed.  Tell your health care provider if you have ever been abused or do not feel safe at home. This information is not intended to replace advice given to you by your health care provider. Make sure you discuss any questions you have with your health care provider. Document Released: 01/10/2011 Document Revised: 12/03/2015 Document Reviewed: 03/31/2015 Elsevier Interactive Patient Education  [employer].

## 2016-12-21 ENCOUNTER — Encounter: Payer: Self-pay | Admitting: *Deleted

## 2016-12-21 NOTE — Progress Notes (Signed)
I have reviewed this visit and discussed with Lauren Ducatte, RN, BSN, and agree with her documentation.   

## 2016-12-27 ENCOUNTER — Ambulatory Visit (INDEPENDENT_AMBULATORY_CARE_PROVIDER_SITE_OTHER): Payer: Medicare Other | Admitting: Family Medicine

## 2016-12-27 ENCOUNTER — Encounter: Payer: Self-pay | Admitting: Family Medicine

## 2016-12-27 ENCOUNTER — Telehealth: Payer: Self-pay | Admitting: *Deleted

## 2016-12-27 ENCOUNTER — Other Ambulatory Visit: Payer: Self-pay | Admitting: Cardiology

## 2016-12-27 DIAGNOSIS — M19071 Primary osteoarthritis, right ankle and foot: Secondary | ICD-10-CM

## 2016-12-27 MED ORDER — DICLOFENAC SODIUM 1 % TD GEL: 2.0000 g | Freq: Four times a day (QID) | TRANSDERMAL | 0 refills | Status: DC

## 2016-12-27 NOTE — Progress Notes (Signed)
   HPI  CC: Right foot pain Patient is here with complaints of right-sided foot pain. She states that this is been getting worse over the past couple weeks. She states that she has the sensation of "buzzing" on the dorsal aspect of her midfoot. Pain is occasionally sharp. It is worse with ambulation. She endorses some recent ""swelling" in the affected area. Patient denies any trauma, or injury. No weakness, numbness. Patient has a history of back pain. She endorses some occasional symptoms of paresthesias.  Patient endorses some recent nausea but denies any vomiting, diarrhea, fevers, chills, headache, dysuria.  Review of Systems See HPI for ROS.   CC, SH/smoking status, and VS noted  Objective: BP 136/80 (BP Location: Left Arm, Patient Position: Sitting, Cuff Size: Normal)   Pulse 90   Temp 98 F (36.7 C) (Oral)   Ht 5\' 5"  (1.651 m)   Wt 133 lb (60.3 kg)   SpO2 95%   BMI 22.13 kg/m  Gen: NAD, alert, cooperative. CV: Well-perfused. Resp: Non-labored. Neuro: Sensation intact throughout. Ankle, Right: No evidence of erythema, or ecchymosis. Very mild evidence of bone spurring in the mid foot. No significant swelling. Moderate to severe varicose veins present. Pulses intact bilaterally. Capillary refill intact. TTP along the base of the midfoot on the lateral aspect. No tenderness at the medial or lateral malleolus. Strength intact. ROM preserved.   Assessment and plan:  Arthritis of foot, right, degenerative Patient is experiencing right-sided foot pain most consistent with osteoarthritis. Very mild bony deformity consistent with osteophyte development noted on exam. No signs of infection, swelling, or trauma. There may be some involvement of radiculopathy noted in her PMH >> discussed the possibility of gabapentin but patient was not interested. - Voltaren gel 4 times a day when necessary (Patient is unable to take NSAIDs due to significant cardiac risk) - Recommended ice/heat as  tolerated - Follow-up when necessary  Next: Due to vague history reporting patient describes some symptoms that may represent claudication. Patients history suggests PVD. Would consider ABIs if symptoms persist or worsen.   Meds ordered this encounter  Medications  . diclofenac sodium (VOLTAREN) 1 % GEL    Sig: Apply 2 g topically 4 (four) times daily.    Dispense:  100 g    Refill:  0     Elberta Leatherwood, MD,MS,  PGY3 12/27/2016 7:03 PM

## 2016-12-27 NOTE — Telephone Encounter (Signed)
Prior Authorization received from [facility] pharmacy for diclofenac sodium 1% gel. PA completed online at www.covermymeds.com. PA is pending per [facility]. Review process could take 24-72 hours.  Derl Barrow, RN

## 2016-12-27 NOTE — Patient Instructions (Signed)
It was a pleasure seeing you today in our clinic. Today we discussed your right foot pain. Here is the treatment plan we have discussed and agreed upon together:   - Apply the Voltaren gel up to 4 times a day as needed for your foot pain. - You may want to try applying a heat pack or an ice pack for pain relief. - If your symptoms do not improve after 2 weeks come back for reevaluation.

## 2016-12-27 NOTE — Assessment & Plan Note (Signed)
Patient is experiencing right-sided foot pain most consistent with osteoarthritis. Very mild bony deformity consistent with osteophyte development noted on exam. No signs of infection, swelling, or trauma. There may be some involvement of radiculopathy noted in her PMH >> discussed the possibility of gabapentin but patient was not interested. - Voltaren gel 4 times a day when necessary (Patient is unable to take NSAIDs due to significant cardiac risk) - Recommended ice/heat as tolerated - Follow-up when necessary  Next: Due to vague history reporting patient describes some symptoms that may represent claudication. Patients history suggests PVD. Would consider ABIs if symptoms persist or worsen.

## 2016-12-28 NOTE — Telephone Encounter (Signed)
PA approved for Diclofenac 1% gel until 07/10/17 via [facility].  Reference number: [id number].  Derl Barrow, RN

## 2017-01-25 ENCOUNTER — Telehealth: Payer: Self-pay | Admitting: Cardiology

## 2017-01-25 MED ORDER — AMLODIPINE BESYLATE 5 MG PO TABS
5.0000 mg | ORAL_TABLET | Freq: Every day | ORAL | 2 refills | Status: DC
Start: 2017-01-25 — End: 2017-10-12

## 2017-01-25 NOTE — Telephone Encounter (Signed)
Pt calling in for a refill of her amlodipine 5 mg po daily.  Pt states she was told that Dr Meda Coffee took her off of this medication. Informed the pt that Dr Meda Coffee did not take her off of this med, and she should be taking this daily. Advised the pt that she should be taking amlodipine 5 mg po daily.  Informed the pt that I will send this refill into her confirmed pharmacy of choice.  Pt verbalized understanding and agrees with this plan.    Assessment & Plan  83 year old patient   1. CAD, s/p PCI to RCA, negative stress test in April 2017, hypertensive response to stress that was corrected and she feels improvement in her symptoms.   2. Hypertension - controlled after adding amlodipine 5 mg daily.  3. Hyperlipidemia - concerning especially with h/o CAD and TIA, intolerant to statins, she is now intolerant to 3 statins including pravastatin atorvastatin and rosuvastatin. PCSK 9 inhibitors are too expensive option for her, she has been started anxiety attack milligrams daily. She will be rechecked for her lipids and CMP today.  4. UTI - resolved. Intolerant to Bactrim allergic to penicillin.  5. Claudications - normal B/L lower extremity Duplex  Follow up in 6 months.  Ena Dawley, MD, [facility] 12/02/2015, 10:23 AM

## 2017-01-25 NOTE — Telephone Encounter (Signed)
New message      Pt c/o medication issue:  1. Name of Medication:  amlodipine 2. How are you currently taking this medication (dosage and times per day)? 5mg  daily 3. Are you having a reaction (difficulty breathing--STAT)? no 4. What is your medication issue? Pt is calling to ask the nurse "why did Dr Meda Coffee take her off this medication".

## 2017-01-25 NOTE — Telephone Encounter (Signed)
Assessment & Plan:   1. Urinary frequency UA with +leuks +nitrite, will send for culture and call patient with result and treat as needed. Elected to not treat prior to culture since she says this frequency has been present for the past year; history does not point to any specific type of incontinence.  - POCT urinalysis dipstick - Urine culture Essential hypertension, benign BP elevated today. Has only been on amlodipine 2.5 recently since she says the lisinopril caused an upset stomach when she took it. Recommended increasing amlodipine to 5mg  and trial of another ACEi, she has follow up with cardiology with scheduled labs in 9 days, if she does not tolerate the enalapril can attempt ARB. Follow up 3 months otherwise.       Assessment & Plan Note by Leone Brand, MD at 11/23/2015 12:04 PM   Author: Leone Brand, MD Author Type: Resident Filed: 11/23/2015 12:04 PM  Note Status: Written Cosign: Cosign Not Required Encounter Date: 11/23/2015  Problem: Essential hypertension, benign  Editor: Leone Brand, MD (Resident)    BP elevated today. Has only been on amlodipine 2.5 recently since she says the lisinopril caused an upset stomach when she took it. Recommended increasing amlodipine to 5mg  and trial of another ACEi, she has follow up with cardiology with scheduled labs in 9 days, if she does not tolerate the enalapril can attempt ARB. Follow up 3 months otherwise.

## 2017-03-01 ENCOUNTER — Encounter: Payer: Self-pay | Admitting: Family Medicine

## 2017-03-01 ENCOUNTER — Ambulatory Visit (INDEPENDENT_AMBULATORY_CARE_PROVIDER_SITE_OTHER): Payer: Medicare Other | Admitting: Family Medicine

## 2017-03-01 VITALS — BP 112/70 | HR 76 | Temp 97.7°F | Ht 65.0 in | Wt 132.2 lb

## 2017-03-01 DIAGNOSIS — R109 Unspecified abdominal pain: Secondary | ICD-10-CM

## 2017-03-01 DIAGNOSIS — N3000 Acute cystitis without hematuria: Secondary | ICD-10-CM | POA: Insufficient documentation

## 2017-03-01 LAB — POCT URINALYSIS DIP (MANUAL ENTRY)
BILIRUBIN UA: NEGATIVE
GLUCOSE UA: NEGATIVE mg/dL
Ketones, POC UA: NEGATIVE mg/dL
Nitrite, UA: POSITIVE — AB
PH UA: 5.5 (ref 5.0–8.0)
Protein Ur, POC: 30 mg/dL — AB
Spec Grav, UA: 1.02 (ref 1.010–1.025)
Urobilinogen, UA: 0.2 E.U./dL

## 2017-03-01 MED ORDER — CEPHALEXIN 500 MG PO CAPS: 500.0000 mg | ORAL_CAPSULE | Freq: Two times a day (BID) | ORAL | 0 refills | Status: DC

## 2017-03-01 NOTE — Assessment & Plan Note (Signed)
  Acute cystitis, urinalysis with leukocytes and nitrites. No flank pain, stable vital signs, doubt pyelonephritis.  -treat w/ keflex 500 BID for 7 days -return precautions for fever, nausea, vomiting, systemic symptoms -patient with PCN allergy -rash- instructed to call if she develops rash would switch to Bactrim or Cipro -patient verbalized agreement and understanding with plan

## 2017-03-01 NOTE — Patient Instructions (Signed)
   It was great seeing you today!  You have a urinary tract infection. I'll treat you with Keflex for 7 days, take it twice a day. If you develop a rash please call me and I'll change your antibiotic to something else.  Please return to be seen if your symptoms worsen or if you develop fevers, abdominal pain, vomiting.   If you have questions or concerns please do not hesitate to call at [phone].  Lucila Maine, DO PGY-2, [facility] 03/01/2017 4:45 PM

## 2017-03-01 NOTE — Progress Notes (Signed)
    Subjective:    Patient ID: Natalie [name], female    DOB: 26-Nov-1933, 83 y.o.   MRN: [id number]   CC: abdominal discomfort  Has had discomfort in her lower abdomen for past 2 weeks. She feels it when she pees, she gets a weird tightening feeling in her stomach. This has happened before when she had a UTI. She wanted to wait it out to see if infection would clear on its own. She does endorse hot flashes, has not checked temperature. No fever here in clinic today. Denies flank pain. Denies dysuria. Endorses some nausea and decreased appetite. No vomiting.   Smoking status reviewed- former smoker  Review of Systems-see HPI   Objective:  BP 112/70   Pulse 76   Temp 97.7 F (36.5 C) (Oral)   Ht 5\' 5"  (1.651 m)   Wt 132 lb 3.2 oz (60 kg)   SpO2 95%   BMI 22.00 kg/m  Vitals and nursing note reviewed  General: well nourished, in no acute distress Cardiac: RRR, clear S1 and S2, no murmurs, rubs, or gallops Respiratory: clear to auscultation bilaterally, no increased work of breathing Abdomen: soft, nontender, nondistended, no masses or organomegaly. Bowel sounds present. No CVA tenderness. Extremities: no edema or cyanosis Skin: warm and dry, no rashes noted Neuro: alert and oriented, no focal deficits  Assessment & Plan:    Acute cystitis without hematuria  Acute cystitis, urinalysis with leukocytes and nitrites. No flank pain, stable vital signs, doubt pyelonephritis.  -treat w/ keflex 500 BID for 7 days -return precautions for fever, nausea, vomiting, systemic symptoms -patient with PCN allergy -rash- instructed to call if she develops rash would switch to Bactrim or Cipro -patient verbalized agreement and understanding with plan    Return if symptoms worsen or fail to improve.   Lucila Maine, DO Family Medicine Resident PGY-2

## 2017-03-04 ENCOUNTER — Emergency Department (HOSPITAL_COMMUNITY)
Admission: EM | Admit: 2017-03-04 | Discharge: 2017-03-04 | Disposition: A | Discharge status: Home / Self Care | Payer: Medicare Other

## 2017-03-04 ENCOUNTER — Emergency Department (HOSPITAL_COMMUNITY)
Admission: EM | Admit: 2017-03-04 | Discharge: 2017-03-04 | Disposition: A | Discharge status: Home / Self Care | Payer: Medicare Other | Attending: Emergency Medicine | Admitting: Emergency Medicine

## 2017-03-04 ENCOUNTER — Encounter (HOSPITAL_COMMUNITY): Payer: Self-pay | Admitting: Emergency Medicine

## 2017-03-04 DIAGNOSIS — Z7982 Long term (current) use of aspirin: Secondary | ICD-10-CM | POA: Diagnosis not present

## 2017-03-04 DIAGNOSIS — Z87440 Personal history of urinary (tract) infections: Secondary | ICD-10-CM | POA: Insufficient documentation

## 2017-03-04 DIAGNOSIS — I2510 Atherosclerotic heart disease of native coronary artery without angina pectoris: Secondary | ICD-10-CM | POA: Insufficient documentation

## 2017-03-04 DIAGNOSIS — Z85038 Personal history of other malignant neoplasm of large intestine: Secondary | ICD-10-CM | POA: Insufficient documentation

## 2017-03-04 DIAGNOSIS — Z880 Allergy status to penicillin: Secondary | ICD-10-CM | POA: Insufficient documentation

## 2017-03-04 DIAGNOSIS — Y69 Unspecified misadventure during surgical and medical care: Secondary | ICD-10-CM | POA: Insufficient documentation

## 2017-03-04 DIAGNOSIS — Z7902 Long term (current) use of antithrombotics/antiplatelets: Secondary | ICD-10-CM | POA: Insufficient documentation

## 2017-03-04 DIAGNOSIS — Z885 Allergy status to narcotic agent status: Secondary | ICD-10-CM | POA: Diagnosis not present

## 2017-03-04 DIAGNOSIS — Z87891 Personal history of nicotine dependence: Secondary | ICD-10-CM | POA: Diagnosis not present

## 2017-03-04 DIAGNOSIS — Z79899 Other long term (current) drug therapy: Secondary | ICD-10-CM | POA: Diagnosis not present

## 2017-03-04 DIAGNOSIS — T50905A Adverse effect of unspecified drugs, medicaments and biological substances, initial encounter: Secondary | ICD-10-CM

## 2017-03-04 DIAGNOSIS — I119 Hypertensive heart disease without heart failure: Secondary | ICD-10-CM | POA: Insufficient documentation

## 2017-03-04 DIAGNOSIS — R21 Rash and other nonspecific skin eruption: Secondary | ICD-10-CM | POA: Diagnosis present

## 2017-03-04 DIAGNOSIS — T887XXA Unspecified adverse effect of drug or medicament, initial encounter: Secondary | ICD-10-CM | POA: Insufficient documentation

## 2017-03-04 HISTORY — DX: Urinary tract infection, site not specified: N39.0

## 2017-03-04 LAB — URINALYSIS, ROUTINE W REFLEX MICROSCOPIC
Bilirubin Urine: NEGATIVE
GLUCOSE, UA: NEGATIVE mg/dL
Hgb urine dipstick: NEGATIVE
Ketones, ur: NEGATIVE mg/dL
LEUKOCYTES UA: NEGATIVE
NITRITE: NEGATIVE
Protein, ur: NEGATIVE mg/dL
Specific Gravity, Urine: 1.016 (ref 1.005–1.030)
pH: 5 (ref 5.0–8.0)

## 2017-03-04 MED ORDER — NYSTATIN 100000 UNIT/GM EX POWD
Freq: Three times a day (TID) | CUTANEOUS | 0 refills | Status: DC
Start: 2017-03-04 — End: 2018-02-13

## 2017-03-04 MED ORDER — CIPROFLOXACIN HCL 500 MG PO TABS: 500.0000 mg | ORAL_TABLET | Freq: Two times a day (BID) | ORAL | 0 refills | Status: AC

## 2017-03-04 NOTE — ED Triage Notes (Signed)
Pt started on ANTIBIOTICS on Wednesday -- states that it is a different one than she normally takes for UTI-- now having a rash/itching in groin area-- and still having "sensation of UTI" --

## 2017-03-04 NOTE — ED Provider Notes (Signed)
[facility] DEPT Provider Note   CSN: [id number] Arrival date & time: 03/04/17  0818     History   Chief Complaint Chief Complaint  Patient presents with  . Rash  . possible med reaction  . Recurrent UTI    HPI Natalie [name] is a 83 y.o. female.  HPI   83 yo F with PMHx as below here with rash/itching. Pt was recently diagnosed with UTI 2 days ago, and placed on keflex. She states she has reacted tot his in the past but was still prescribed it. Since taking it, she states her urine pressure/sx have not improved and she has now had an itchy, red rash in her groin area. She denies any other changes. She states she has had this reaction in the past, requests med change. She denies any fever, fatigue, nausea, vomiting. No flank pain. She is o/w at her baseline state of health. No confusion per family. She ambulated to ED w/o difficulty. No wheezing or other rash.  Past Medical History:  Diagnosis Date  . Back pain    with radiculopathy  . CAD (coronary artery disease)    prior DES Jan 2007 with low risk myoview in 2008  . Colon cancer ([place])    prior adenocarcinoma of cecum in 2000 s/p chemo and colon resection  . GERD (gastroesophageal reflux disease)   . Glaucoma suspect   . HTN (hypertension)   . Hyperlipidemia   . TIA (transient ischemic attack)   . UTI (urinary tract infection)     Patient Active Problem List   Diagnosis Date Noted  . Acute cystitis without hematuria 03/01/2017  . Arthritis of foot, right, degenerative 12/27/2016  . Dyspnea 09/27/2016  . Acute upper respiratory infection 08/17/2016  . Encounter for long-term (current) use of other high-risk medications 10/20/2015  . Neck and shoulder pain 06/19/2015  . Acute sinusitis 12/30/2014  . Atypical chest pain 09/02/2013  . GERD (gastroesophageal reflux disease) 09/02/2013  . TIA (transient ischemic attack) 05/08/2013  . Spine pain, lumbosacral 07/25/2011  . GERD 07/20/2009  . HERNIATED  LUMBAR DISK WITH RADICULOPATHY 05/18/2009  . RADICULOPATHY 05/14/2009  . CAD, NATIVE VESSEL 09/22/2008  . Colon cancer ([place]) 10/18/2007  . Hyperlipidemia 10/18/2007  . Essential hypertension, benign 10/18/2007    Past Surgical History:  Procedure Laterality Date  . ABDOMINAL HYSTERECTOMY    . BACK SURGERY    . CORONARY STENT PLACEMENT  Jan 2007   DES to RCA  . Microdiscectomy     L4-L5    OB History    No data available       Home Medications    Prior to Admission medications   Medication Sig Start Date End Date Taking? Authorizing Provider  acetaminophen (TYLENOL) 650 MG CR tablet Take 650 mg by mouth every 8 (eight) hours as needed. For pain   Yes [provider]  amLODipine (NORVASC) 5 MG tablet Take 1 tablet (5 mg total) by mouth daily. 01/25/17  Yes Dorothy Spark, MD  aspirin EC 81 MG tablet Take 81 mg by mouth daily.   Yes [provider]  cephALEXin (KEFLEX) 500 MG capsule Take 1 capsule (500 mg total) by mouth 2 (two) times daily. 03/01/17  Yes Lucila Maine C, DO  clopidogrel (PLAVIX) 75 MG tablet Take 1 tablet (75 mg total) by mouth daily. 04/04/16  Yes Riccio, Levada Dy C, DO  diclofenac sodium (VOLTAREN) 1 % GEL Apply 2 g topically 4 (four) times daily. 12/27/16  Yes McKeag,  Marylynn Pearson, MD  ezetimibe (ZETIA) 10 MG tablet TAKE 1 TABLET (10 MG TOTAL) BY MOUTH DAILY. 10/19/16  Yes Dorothy Spark, MD  metoprolol tartrate (LOPRESSOR) 50 MG tablet TAKE 1 TABLET (50 MG TOTAL) BY MOUTH 2 (TWO) TIMES DAILY. 12/27/16  Yes Dorothy Spark, MD  nitroGLYCERIN (NITROSTAT) 0.4 MG SL tablet Place 1 tablet (0.4 mg total) under the tongue every 5 (five) minutes as needed for chest pain. 10/02/15  Yes Dorothy Spark, MD  umeclidinium-vilanterol (ANORO ELLIPTA) 62.5-25 MCG/INH AEPB Inhale 1 puff into the lungs daily. 09/30/16  Yes Hensel, Jamal Collin, MD  albuterol (PROVENTIL HFA;VENTOLIN HFA) 108 (90 Base) MCG/ACT inhaler Inhale 2 puffs into the lungs every 6 (six)  hours as needed for wheezing or shortness of breath. Patient not taking: Reported on 12/20/2016 09/27/16   Kinnie Feil, MD  ciprofloxacin (CIPRO) 500 MG tablet Take 1 tablet (500 mg total) by mouth 2 (two) times daily. 03/04/17 03/09/17  Duffy Bruce, MD  enalapril (VASOTEC) 5 MG tablet Take 1 tablet (5 mg total) by mouth daily. Patient not taking: Reported on 12/20/2016 11/23/15   Leone Brand, MD  nystatin (MYCOSTATIN/NYSTOP) powder Apply topically 3 (three) times daily. To groin area, for one week. 03/04/17   Duffy Bruce, MD    Family History Family History  Problem Relation Age of Onset  . Stroke Father        Deceased, 16s  . Diabetes Brother   . Healthy Son     Social History Social History  Substance Use Topics  . Smoking status: Former Smoker    Packs/day: 0.50    Years: 38.00    Types: Cigarettes    Start date: 07/21/1955    Quit date: 08/25/1993  . Smokeless tobacco: Never Used  . Alcohol use No     Allergies   Claritin [loratadine]; Crestor [rosuvastatin calcium]; Lipitor [atorvastatin]; Oxycontin [oxycodone hcl]; and Penicillins   Review of Systems Review of Systems  Constitutional: Negative for chills and fever.  HENT: Negative for congestion, rhinorrhea and sore throat.   Eyes: Negative for visual disturbance.  Respiratory: Negative for cough, shortness of breath and wheezing.   Cardiovascular: Negative for chest pain and leg swelling.  Gastrointestinal: Negative for abdominal pain, diarrhea, nausea and vomiting.  Genitourinary: Positive for dysuria. Negative for flank pain, vaginal bleeding and vaginal discharge.  Musculoskeletal: Negative for neck pain.  Skin: Positive for rash.  Allergic/Immunologic: Negative for immunocompromised state.  Neurological: Negative for syncope and headaches.  Hematological: Does not bruise/bleed easily.  All other systems reviewed and are negative.    Physical Exam Updated Vital Signs BP (!) 157/79 (BP  Location: Right Arm)   Pulse (!) 58   Temp (!) 97.5 F (36.4 C) (Oral)   Resp 20   Ht 5\' 5"  (1.651 m)   Wt 60.3 kg (133 lb)   SpO2 99%   BMI 22.13 kg/m   Physical Exam  Constitutional: She is oriented to person, place, and time. She appears well-developed and well-nourished. No distress.  HENT:  Head: Normocephalic and atraumatic.  Eyes: Conjunctivae are normal.  Neck: Neck supple.  Cardiovascular: Normal rate, regular rhythm and normal heart sounds.  Exam reveals no friction rub.   No murmur heard. Pulmonary/Chest: Effort normal and breath sounds normal. No respiratory distress. She has no wheezes. She has no rales.  Abdominal: Soft. Bowel sounds are normal. She exhibits no distension. There is no tenderness. There is no rebound and no guarding.  Genitourinary:  Genitourinary Comments: Chaperone present. Mild urticarial rash in bilateral proximal thighs. No induration. No fluctuance. No vaginal discharge or bleeding. No l abial lesions.  Musculoskeletal: She exhibits no edema.  Neurological: She is alert and oriented to person, place, and time. She exhibits normal muscle tone.  Skin: Skin is warm. Capillary refill takes less than 2 seconds.  Psychiatric: She has a normal mood and affect.  Nursing note and vitals reviewed.    ED Treatments / Results  Labs (all labs ordered are listed, but only abnormal results are displayed) Labs Reviewed  URINALYSIS, ROUTINE W REFLEX MICROSCOPIC    EKG  EKG Interpretation None       Radiology No results found.  Procedures Procedures (including critical care time)  Medications Ordered in ED Medications - No data to display   Initial Impression / Assessment and Plan / ED Course  I have reviewed the triage vital signs and the nursing notes.  Pertinent labs & imaging results that were available during my care of the patient were reviewed by me and considered in my medical decision making (see chart for details).     83 yo F  here with mild labial rash after starting Keflex for UTI. Exam as above. Pt does have mildly pruritic rash centering in groin area, but no signs of wheezing, other rash, or anaphylaxis. Rash is mildly erythematous, and given localized distribution this may represent local allergic rxn versus early candidal infection in setting of ABX use. Pt refuses to take Keflex any more, though UA does show significant improvement compared to clinic UA. Will switch to Cipro (has tolerated this in past) and give empiric nystatin for her erythematous rash. No signs of PID. No fever, no flank pain, no signs of pyelo. VSS. Pt is o/w very well appearing and at her baseline state of health. Return precautions given.  Final Clinical Impressions(s) / ED Diagnoses   Final diagnoses:  Adverse effect of drug, initial encounter    New Prescriptions Discharge Medication List as of 03/04/2017 12:05 PM    START taking these medications   Details  ciprofloxacin (CIPRO) 500 MG tablet Take 1 tablet (500 mg total) by mouth 2 (two) times daily., Starting Sat 03/04/2017, Until Thu 03/09/2017, Print    nystatin (MYCOSTATIN/NYSTOP) powder Apply topically 3 (three) times daily. To groin area, for one week., Starting Sat 03/04/2017, Print         Duffy Bruce, MD 03/04/17 1911

## 2017-03-04 NOTE — ED Notes (Signed)
ED Provider at bedside. 

## 2017-04-10 ENCOUNTER — Ambulatory Visit (INDEPENDENT_AMBULATORY_CARE_PROVIDER_SITE_OTHER): Payer: Medicare Other | Admitting: Internal Medicine

## 2017-04-10 VITALS — BP 160/80 | HR 74 | Temp 97.6°F | Wt 129.2 lb

## 2017-04-10 DIAGNOSIS — R350 Frequency of micturition: Secondary | ICD-10-CM | POA: Diagnosis not present

## 2017-04-10 LAB — POCT URINALYSIS DIP (MANUAL ENTRY)
Bilirubin, UA: NEGATIVE
Blood, UA: NEGATIVE
Glucose, UA: NEGATIVE mg/dL
Ketones, POC UA: NEGATIVE mg/dL
Leukocytes, UA: NEGATIVE
Nitrite, UA: NEGATIVE
SPEC GRAV UA: 1.02 (ref 1.010–1.025)
UROBILINOGEN UA: 0.2 U/dL
pH, UA: 5.5 (ref 5.0–8.0)

## 2017-04-10 NOTE — Patient Instructions (Signed)
It was so nice to meet you!  Your urine looked normal. I will send it for urine culture to see if any bacteria grows. I will call you in the next 2-3 days to let you know the results.  -Dr. Brett Albino

## 2017-04-12 DIAGNOSIS — R350 Frequency of micturition: Secondary | ICD-10-CM | POA: Insufficient documentation

## 2017-04-12 NOTE — Progress Notes (Signed)
   [facility] Phone: [phone]  Subjective:  Natalynn is an 83 year old female presenting to clinic with concern for UTI. She denies any dysuria, but notes a "sensation in her thighs going up to her stomach", which she thinks is a UTI. She endorses urinary urgency and frequency for the last few days. She also states she feels "feverish" but she hasn't checked a temperature. She endorses occasionally "feeling hot". She also endorses suprapubic pain and malodorous urine. She denies any hematuria. She has had recurrent UTIs. She was last diagnosed with a UTI on 03/01/17. She was prescribed Keflex, but broke out into a rash so she went to the ED and was given Ciprofloxacin for 7 days. She took this medication and her symptoms went away.   ROS: See HPI for pertinent positives and negatives  Past Medical History- HTN, CAD, hx TIA, hx colon cancer, HLD  Family history reviewed for today's visit. No changes.  Social history- patient is a former smoker, quit in 1995.  Objective: BP (!) 160/80 (BP Location: Left Arm, Patient Position: Sitting, Cuff Size: Normal)   Pulse 74   Temp 97.6 F (36.4 C) (Oral)   Wt 129 lb 3.2 oz (58.6 kg)   BMI 21.50 kg/m  Gen: NAD, alert, cooperative with exam HEENT: NCAT, EOMI, MMM Resp: Normal work of breathing GI: Soft ,+mild suprapubic tenderness, BS present, no guarding or organomegaly Msk: No CVA tenderness  Assessment/Plan: Urinary Frequency: Patient concerned for UTI and endorsing suprapubic tenderness, feeling "feverish", urinary urgency, urinary frequency, and malodorous urine. No fever in clinic today and no CVA tenderness. - UA performed in clinic today was negative for signs of infection - Given patient's symptoms, will order urine culture - Will call patient with results  Follow-up with PCP for HTN appointment.   Hyman Bible, MD PGY-3

## 2017-04-12 NOTE — Assessment & Plan Note (Signed)
Patient concerned for UTI and endorsing suprapubic tenderness, feeling "feverish", urinary urgency, urinary frequency, and malodorous urine. No fever in clinic today and no CVA tenderness. - UA performed in clinic today was negative for signs of infection - Given patient's symptoms, will order urine culture - Will call patient with results

## 2017-04-13 LAB — URINE CULTURE

## 2017-04-18 ENCOUNTER — Other Ambulatory Visit: Payer: Self-pay | Admitting: Family Medicine

## 2017-05-04 ENCOUNTER — Ambulatory Visit (INDEPENDENT_AMBULATORY_CARE_PROVIDER_SITE_OTHER): Payer: Medicare Other | Admitting: *Deleted

## 2017-05-04 DIAGNOSIS — Z23 Encounter for immunization: Secondary | ICD-10-CM | POA: Diagnosis not present

## 2017-05-17 ENCOUNTER — Ambulatory Visit: Payer: Medicare Other | Admitting: Family Medicine

## 2017-05-17 ENCOUNTER — Other Ambulatory Visit: Payer: Self-pay

## 2017-05-17 ENCOUNTER — Encounter: Payer: Self-pay | Admitting: Family Medicine

## 2017-05-17 VITALS — BP 135/60 | HR 66 | Temp 98.0°F | Ht 65.0 in | Wt 128.6 lb

## 2017-05-17 DIAGNOSIS — M79605 Pain in left leg: Secondary | ICD-10-CM

## 2017-05-17 MED ORDER — TRAMADOL HCL 50 MG PO TABS: 50.0000 mg | ORAL_TABLET | Freq: Three times a day (TID) | ORAL | 1 refills | Status: DC | PRN

## 2017-05-17 MED ORDER — LIDOCAINE 0.5 % EX GEL: 1.0000 "application " | Freq: Three times a day (TID) | CUTANEOUS | 1 refills | Status: DC | PRN

## 2017-05-17 NOTE — Progress Notes (Signed)
    Subjective:    Patient ID: Natalie [name], female    DOB: 11-17-1933, 83 y.o.   MRN: [id number]   CC: leg pain  Patient presents with a 4 day history of left sided hip pain that radiates down to her thigh. She reports Friday she was very active standing and cooking all day to prepare for homecoming celebration. Saturday during the day she felt progressive aching in her leg that worsened, by nighttime the pain made it difficult to sleep. She has been taking tylenol over the counter without much relief. She has also tried ice and heat which has helped some. She has used a lidocaine patch over the area that has provided some relief but the adhesive has given her a rash. She is hoping for a medication that will immediately take the pain away. She has many drug allergies and is hesitant to add too many medications. She is a retired [employer] account planner and   Smoking status reviewed- former smoker  Review of Systems- no low back pain, muscle weakness, loss of bladder or bowel function, fevers, chills.    Objective:  BP 135/60 (BP Location: Left Arm, Patient Position: Sitting, Cuff Size: Normal)   Pulse 66   Temp 98 F (36.7 C) (Oral)   Ht 5\' 5"  (1.651 m)   Wt 128 lb 9.6 oz (58.3 kg)   SpO2 95%   BMI 21.40 kg/m  Vitals and nursing note reviewed  General: well nourished, in no acute distress Cardiac: RRR, clear S1 and S2, no murmurs, rubs, or gallops Respiratory: clear to auscultation bilaterally, no increased work of breathing Extremities: no edema or cyanosis. Left leg tender to palpation over greater trochanter with decreased gluteus medius strength testing on left. LE neurovascularly in tact. Skin: warm and dry, no rashes noted Neuro: alert and oriented, no focal deficits   Assessment & Plan:    Leg pain, lateral, left  Discussed treatment options at length with patient. Explained we would not prescribe opiates for this problem. Offered tramadol or gabapentin for pain. Also  offered steroid soft tissue injection or course of oral steroids. Offered PT referral. Reviewed exercises with patient to help with pain relief.  -rx for tramadol given -pt declined the other above treatment options -encouraged her to do exercises for rehab, handout given -ice, heat, tylenol as needed -lidocaine gel prescribed as well as adhesive has irritated her skin in lidocaine patch -follow up as needed     Return in about 1 month (around 06/16/2017), or if symptoms worsen or fail to improve.   Lucila Maine, DO Family Medicine Resident PGY-2

## 2017-05-17 NOTE — Patient Instructions (Signed)
I'm sorry you're hurting so much!  Please take the tramadol for pain. You can take tylenol as needed as well. Please use ice and heat as needed for pain relief. I hope this will clear up in the next several weeks.  Continue doing exercises including lateral leg lifts to strengthen those muscles.  If you have questions or concerns please do not hesitate to call at [phone].  Natalie Maine, DO PGY-2, [facility] 05/17/2017 3:13 PM   Gluteus Medius Syndrome Rehab Ask your health care provider which exercises are safe for you. Do exercises exactly as told by your health care provider and adjust them as directed. It is normal to feel mild stretching, pulling, tightness, or discomfort as you do these exercises, but you should stop right away if you feel sudden pain or your pain gets worse. Do not begin these exercises until told by your health care provider. Stretching and range of motion exercise This exercise warms up your muscles and joints and improves the movement and flexibility of your hip and pelvis. This exercise also helps to relieve pain and stiffness. Exercise A: Lunge ( hip flexor stretch) 1. Kneel on the floor on your left / right knee. Bend your other knee so it is directly over your ankle. 2. Keep good posture with your head over your shoulders. Tuck your tailbone underneath you. This will prevent your back from arching too much. 3. You should feel a gentle stretch in the front of your thigh or hip. If you do not feel a stretch, slowly lunge forward with your chest up. 4. Hold this position for __________ seconds. 5. Slowly return to the starting position. Repeat __________ times. Complete this exercise __________ times a day. Strengthening exercises These exercises build strength and endurance in your hip and pelvis. Endurance is the ability to use your muscles for a long time, even after they get tired. Exercise B: Bridge ( hip extensors) 1. Lie on your  back on a firm surface with your knees bent and your feet flat on the floor. 2. Tighten your buttocks muscles and lift your bottom off the floor until the trunk of your body is level with your thighs. ? You should feel the muscles working in your buttocks and the back of your thighs. If this exercise is too easy, cross your arms over your chest or lift one leg while your bottom is up off the floor. ? Do not arch your back. 3. Hold this position for __________ seconds. 4. Slowly lower your hips to the starting position. 5. Let your muscles relax completely between repetitions. Repeat __________ times. Complete this exercise __________ times a day. Exercise C: Straight leg raises ( hip abductors) 1. Lie on your side with your left / right leg in the top position. Lie so your head, shoulder, knee, and hip line up. Bend your bottom knee to help you balance. 2. Lift your top leg up 4-6 inches (10-15 cm), keeping your toes pointed straight ahead. 3. Hold this position for __________ seconds. 4. Slowly lower your leg to the starting position and let your muscles relax completely. Repeat __________ times. Complete this exercise __________ times a day. Exercise D: Hip abductors and external rotators, quadruped 1. Get on your hands and knees on a firm, lightly padded surface. Your hands should be directly below your shoulders, and your knees should be directly below your hips. 2. Lift your left / right knee out to the side. Keep your knee bent. Do  not twist your body. 3. Hold this position for __________ seconds. 4. Slowly lower your leg. Repeat __________ times. Complete this exercise __________ times a day. Exercise E: Single leg stand 1. Stand near a counter or door frame to hold onto as needed. It is helpful to look in a mirror for this exercise so you can watch your hip. 2. Squeeze your left / right buttock muscles then lift up your other foot. Do not let your left / righthip push out to the  side. 3. Hold this position for __________ seconds. Repeat __________ times. Complete this exercise __________ times a day. This information is not intended to replace advice given to you by your health care provider. Make sure you discuss any questions you have with your health care provider. Document Released: 06/27/2005 Document Revised: 03/03/2016 Document Reviewed: 06/09/2015 Elsevier Interactive Patient Education  [employer].

## 2017-05-18 NOTE — Assessment & Plan Note (Signed)
  Discussed treatment options at length with patient. Explained we would not prescribe opiates for this problem. Offered tramadol or gabapentin for pain. Also offered steroid soft tissue injection or course of oral steroids. Offered PT referral. Reviewed exercises with patient to help with pain relief.  -rx for tramadol given -pt declined the other above treatment options -encouraged her to do exercises for rehab, handout given -ice, heat, tylenol as needed -lidocaine gel prescribed as well as adhesive has irritated her skin in lidocaine patch -follow up as needed

## 2017-05-20 ENCOUNTER — Other Ambulatory Visit: Payer: Self-pay

## 2017-05-20 ENCOUNTER — Encounter (HOSPITAL_COMMUNITY): Payer: Self-pay | Admitting: Emergency Medicine

## 2017-05-20 ENCOUNTER — Ambulatory Visit (HOSPITAL_COMMUNITY)
Admission: EM | Admit: 2017-05-20 | Discharge: 2017-05-20 | Disposition: A | Discharge status: Home / Self Care | Payer: Medicare Other | Attending: Radiology | Admitting: Radiology

## 2017-05-20 DIAGNOSIS — R21 Rash and other nonspecific skin eruption: Secondary | ICD-10-CM

## 2017-05-20 MED ORDER — VALACYCLOVIR HCL 1 G PO TABS: 1000.0000 mg | ORAL_TABLET | Freq: Three times a day (TID) | ORAL | 0 refills | Status: AC

## 2017-05-20 MED ORDER — CAPSICUM OLEORESIN 0.025 % EX CREA: 1.0000 "application " | TOPICAL_CREAM | Freq: Three times a day (TID) | CUTANEOUS | 0 refills | Status: DC

## 2017-05-20 NOTE — Discharge Instructions (Signed)
Continue with ice heat and tylenol as needed for pain relief. Follow up with PMD for shingles vaccine.

## 2017-05-20 NOTE — ED Triage Notes (Signed)
The patient presented to the [facility] with a complaint of a rash and pain to the right leg. The patient denied any known injury.

## 2017-05-20 NOTE — ED Provider Notes (Signed)
[facility]    CSN: [id number] Arrival date & time: 05/20/17  1208     History   Chief Complaint Chief Complaint  Patient presents with  . Rash    HPI Natalie [name] is a 83 y.o. female.   83 y.o. female presents with rash to  Left to thigh  X 3 days. Patient states that itinitally she had pain to her left hip X 1 week that she went to her PMD and was being treated for sciatica. Patient states that the ras erupted after her PMD visit Condition is acute in nature. Condition is made better by ice, heat and pain. Condition is made worse by tramadol. Patient denies any relief from tramadol prior to there arrival at this facility. Patient states that she has had shingles in the past and her symtpoms are similar. Patient has no sick contact and denies any fever or URI symptoms.        Past Medical History:  Diagnosis Date  . Back pain    with radiculopathy  . CAD (coronary artery disease)    prior DES Jan 2007 with low risk myoview in 2008  . Colon cancer ([place])    prior adenocarcinoma of cecum in 2000 s/p chemo and colon resection  . GERD (gastroesophageal reflux disease)   . Glaucoma suspect   . HTN (hypertension)   . Hyperlipidemia   . TIA (transient ischemic attack)   . UTI (urinary tract infection)     Patient Active Problem List   Diagnosis Date Noted  . Leg pain, lateral, left 05/17/2017  . Arthritis of foot, right, degenerative 12/27/2016  . Encounter for long-term (current) use of other high-risk medications 10/20/2015  . Neck and shoulder pain 06/19/2015  . TIA (transient ischemic attack) 05/08/2013  . Spine pain, lumbosacral 07/25/2011  . GERD 07/20/2009  . HERNIATED LUMBAR DISK WITH RADICULOPATHY 05/18/2009  . RADICULOPATHY 05/14/2009  . CAD, NATIVE VESSEL 09/22/2008  . Colon cancer ([place]) 10/18/2007  . Hyperlipidemia 10/18/2007  . Essential hypertension, benign 10/18/2007    Past Surgical History:  Procedure Laterality Date  .  ABDOMINAL HYSTERECTOMY    . BACK SURGERY    . CORONARY STENT PLACEMENT  Jan 2007   DES to RCA  . Microdiscectomy     L4-L5    OB History    No data available       Home Medications    Prior to Admission medications   Medication Sig Start Date End Date Taking? Authorizing Provider  acetaminophen (TYLENOL) 650 MG CR tablet Take 650 mg by mouth every 8 (eight) hours as needed. For pain   Yes [provider]  amLODipine (NORVASC) 5 MG tablet Take 1 tablet (5 mg total) by mouth daily. 01/25/17  Yes Dorothy Spark, MD  aspirin EC 81 MG tablet Take 81 mg by mouth daily.   Yes [provider]  clopidogrel (PLAVIX) 75 MG tablet TAKE 1 TABLET (75 MG TOTAL) BY MOUTH DAILY. 04/20/17  Yes Riccio, Levada Dy C, DO  diclofenac sodium (VOLTAREN) 1 % GEL Apply 2 g topically 4 (four) times daily. 12/27/16  Yes McKeag, Marylynn Pearson, MD  ezetimibe (ZETIA) 10 MG tablet TAKE 1 TABLET (10 MG TOTAL) BY MOUTH DAILY. 10/19/16  Yes Dorothy Spark, MD  Lidocaine 0.5 % GEL Apply 1 application 3 (three) times daily as needed topically. 05/17/17  Yes Riccio, Levada Dy C, DO  metoprolol tartrate (LOPRESSOR) 50 MG tablet TAKE 1 TABLET (50 MG TOTAL) BY MOUTH  2 (TWO) TIMES DAILY. 12/27/16  Yes Dorothy Spark, MD  traMADol (ULTRAM) 50 MG tablet Take 1 tablet (50 mg total) every 8 (eight) hours as needed by mouth. 05/17/17  Yes Riccio, Gardiner Rhyme, DO  nitroGLYCERIN (NITROSTAT) 0.4 MG SL tablet Place 1 tablet (0.4 mg total) under the tongue every 5 (five) minutes as needed for chest pain. 10/02/15   Dorothy Spark, MD  nystatin (MYCOSTATIN/NYSTOP) powder Apply topically 3 (three) times daily. To groin area, for one week. 03/04/17   Duffy Bruce, MD  umeclidinium-vilanterol (ANORO ELLIPTA) 62.5-25 MCG/INH AEPB Inhale 1 puff into the lungs daily. 09/30/16   Zenia Resides, MD    Family History Family History  Problem Relation Age of Onset  . Stroke Father        Deceased, 17s  . Diabetes Brother   .  Healthy Son     Social History Social History   Tobacco Use  . Smoking status: Former Smoker    Packs/day: 0.50    Years: 38.00    Pack years: 19.00    Types: Cigarettes    Start date: 07/21/1955    Last attempt to quit: 08/25/1993    Years since quitting: 23.7  . Smokeless tobacco: Never Used  Substance Use Topics  . Alcohol use: No  . Drug use: No     Allergies   Claritin [loratadine]; Crestor [rosuvastatin calcium]; Lipitor [atorvastatin]; Oxycontin [oxycodone hcl]; and Penicillins   Review of Systems Review of Systems  Constitutional: Negative for chills and fever.  HENT: Negative for ear pain and sore throat.   Eyes: Negative for pain and visual disturbance.  Respiratory: Negative for cough and shortness of breath.   Cardiovascular: Negative for chest pain and palpitations.  Gastrointestinal: Negative for abdominal pain and vomiting.  Genitourinary: Negative for dysuria and hematuria.  Musculoskeletal: Negative for arthralgias and back pain.  Skin: Positive for rash ( red and painful to left thigh). Negative for color change.  Neurological: Negative for seizures and syncope.  All other systems reviewed and are negative.    Physical Exam Triage Vital Signs ED Triage Vitals  Enc Vitals Group     BP 05/20/17 1245 (!) 153/77     Pulse Rate 05/20/17 1245 65     Resp 05/20/17 1245 18     Temp --      Temp src --      SpO2 05/20/17 1245 100 %     Weight --      Height --      Head Circumference --      Peak Flow --      Pain Score 05/20/17 1246 9     Pain Loc --      Pain Edu? --      Excl. in [place]? --    No data found.  Updated Vital Signs BP (!) 153/77 (BP Location: Left Arm)   Pulse 65   Resp 18   SpO2 100%   Visual Acuity Right Eye Distance:   Left Eye Distance:   Bilateral Distance:    Right Eye Near:   Left Eye Near:    Bilateral Near:     Physical Exam  Constitutional: She is oriented to person, place, and time. She appears  well-developed and well-nourished.  HENT:  Head: Normocephalic and atraumatic.  Eyes: Conjunctivae are normal.  Neck: Normal range of motion.  Pulmonary/Chest: Effort normal.  Neurological: She is alert and oriented to person, place, and time.  Skin:  Rash noted.  L2 erythremic with pustules  Psychiatric: She has a normal mood and affect.  Nursing note and vitals reviewed.    UC Treatments / Results  Labs (all labs ordered are listed, but only abnormal results are displayed) Labs Reviewed - No data to display  EKG  EKG Interpretation None       Radiology No results found.  Procedures Procedures (including critical care time)  Medications Ordered in UC Medications - No data to display   Initial Impression / Assessment and Plan / UC Course  I have reviewed the triage vital signs and the nursing notes.  Pertinent labs & imaging results that were available during my care of the patient were reviewed by me and considered in my medical decision making (see chart for details).       Final Clinical Impressions(s) / UC Diagnoses   Final diagnoses:  None    ED Discharge Orders    None       Controlled Substance Prescriptions [place] Controlled Substance Registry consulted? Not Applicable   Jacqualine Mau, NP 05/20/17 1313

## 2017-06-08 ENCOUNTER — Telehealth: Payer: Self-pay | Admitting: *Deleted

## 2017-06-08 ENCOUNTER — Encounter: Payer: Self-pay | Admitting: Internal Medicine

## 2017-06-08 ENCOUNTER — Ambulatory Visit: Payer: Medicare Other | Admitting: Internal Medicine

## 2017-06-08 ENCOUNTER — Other Ambulatory Visit: Payer: Self-pay

## 2017-06-08 DIAGNOSIS — B029 Zoster without complications: Secondary | ICD-10-CM | POA: Diagnosis not present

## 2017-06-08 HISTORY — DX: Zoster without complications: B02.9

## 2017-06-08 MED ORDER — GABAPENTIN 100 MG PO CAPS: 100.0000 mg | ORAL_CAPSULE | Freq: Three times a day (TID) | ORAL | 0 refills | Status: DC

## 2017-06-08 MED ORDER — LIDOCAINE 5 % EX PTCH: 1.0000 | MEDICATED_PATCH | CUTANEOUS | 0 refills | Status: DC

## 2017-06-08 NOTE — Patient Instructions (Addendum)
It was so nice to see you! I'm sorry to hear about your shingles.  I have prescribed a medication called Gabapentin. Please take 1 tablet as needed three times per day. This medication might make you drowsy, so please start by taking it at night.  I have also prescribed a lidocaine patch to place over the area that hurts. Please call us if you have difficulty affording the patch.  -Dr. Brett Albino

## 2017-06-08 NOTE — Telephone Encounter (Signed)
Pt lm on nurse line.  Wants to know when she can get her shingles shot.  Fleeger, Salome Spotted, CMA

## 2017-06-08 NOTE — Assessment & Plan Note (Signed)
Located in the distribution of L2 or L3. Unable to afford capsaicin cream. Has been taking Valtrex. - Continue Valtrex to finish course - Prescribed Gabapentin 100mg  tid to see if this helps - Prescribed lidocaine patch to place over area (patient thought this had caused her to develop this rash in the first place, but the rash she had was likely early shingles). - Follow-up as needed with PCP.

## 2017-06-08 NOTE — Progress Notes (Signed)
   [facility] Phone: [phone]  Subjective:  Natalie [name] is an 83 year old female presenting to clinic with pain from her shingles. She was seen at urgent care on 05/20/2017 and was diagnosed with shingles. She was prescribed Capsaicin cream, which was too expensive to afford. She states she was also given a prescription for Valtrex, although this is not listed anywhere in the chart. She continued to be in pain, so she went to see her old doctor (who is retired). This doctor prescribed her hydrocodone, which she took. She states this didn't really help her pain either. The pain is located on her left lateral/anterior leg. The pain is "burning" and "aching". The pain is constant and feels like it is getting worse. She tried using icy hot, which made it worse.   ROS: See HPI for pertinent positives and negatives  Past Medical History- HTN, hx TIA, colon cancer, HLD, CAD  Family history reviewed for today's visit. No changes.  Social history- patient is a former smoker, quit in 1995.  Objective: BP 112/68   Pulse 83   Temp (!) 97.5 F (36.4 C) (Oral)   Ht 5\' 5"  (1.651 m)   Wt 127 lb (57.6 kg)   SpO2 93%   BMI 21.13 kg/m  Gen: NAD, alert, cooperative with exam Skin: Small area of erythematous pustules present over the left anterior thigh with another patch of pustules located laterally.  Assessment/Plan: Shingles: Located in the distribution of L2 or L3. Unable to afford capsaicin cream. Has been taking Valtrex. - Continue Valtrex to finish course - Prescribed Gabapentin 100mg  tid to see if this helps - Prescribed lidocaine patch to place over area (patient thought this had caused her to develop this rash in the first place, but the rash she had was likely early shingles). - Follow-up as needed with PCP.   Hyman Bible, MD PGY-3

## 2017-06-09 ENCOUNTER — Telehealth: Payer: Self-pay | Admitting: *Deleted

## 2017-06-09 NOTE — Telephone Encounter (Signed)
LM for patient ok per DPR. Nashia Remus,CMA  

## 2017-06-09 NOTE — Telephone Encounter (Signed)
Arlan Organ Key: [email or web address].Monday - PA Case ID: [id number] - Rx #: [id number] [facility] is reviewing our PA request. Typically an electronic response will be received within 72 hours. Hubbard Hartshorn, RN, BSN

## 2017-06-09 NOTE — Telephone Encounter (Signed)
Please let patient know that she should not get her shingles shot during any active outbreak. She needs to wait at least until the rash goes away. Thanks!

## 2017-06-12 NOTE — Telephone Encounter (Signed)
[facility] notified that PA for lidocaine patches was approved. Ref #[id number]. Approval came through on 06/09/2017 and pharmacy filled it on that date. Hubbard Hartshorn, RN, BSN

## 2017-06-29 ENCOUNTER — Other Ambulatory Visit: Payer: Self-pay | Admitting: Cardiology

## 2017-07-13 ENCOUNTER — Encounter: Payer: Self-pay | Admitting: Internal Medicine

## 2017-07-13 ENCOUNTER — Ambulatory Visit: Payer: Medicare Other | Admitting: Internal Medicine

## 2017-07-13 DIAGNOSIS — B0229 Other postherpetic nervous system involvement: Secondary | ICD-10-CM | POA: Diagnosis not present

## 2017-07-13 MED ORDER — GABAPENTIN 100 MG PO CAPS: 200.0000 mg | ORAL_CAPSULE | Freq: Three times a day (TID) | ORAL | 1 refills | Status: DC

## 2017-07-13 NOTE — Progress Notes (Signed)
   Subjective:   Patient: Natalie [name]       Birthdate: 03-Feb-1934       MRN: [id number]      HPI  Natalie [name] is a 84 y.o. female presenting for same day appt for leg pain after shingles.   Post-herpetic neuralgia Patient diagnosed with shingles a couple months ago, and since then has had L leg pain. Describes pain as burning and very severe. Rates pain 10/10 today. Pain located at lateral thigh. Was prescribed gabapentin 100mg  TID on 122/29. Has been taking this but says it si not helpful. Was also prescribed lidocaine patches which she is no longer using because she said they were not helpful. Has been prescribed Tramadol for this pain as well which she said made the pain worse so is no longer taking. Has old Voltaren prescription and has been using that on the area with relief for a few minutes. Patient says she only gets relief with Vicodin, which was reportedly prescribed to her by her retired former Engineer, drilling.   Smoking status reviewed. Patient is former smoker.   Review of Systems See HPI.     Objective:  Physical Exam  Constitutional: She is oriented to person, place, and time and well-developed, well-nourished, and in no distress.  HENT:  Head: Normocephalic and atraumatic.  Pulmonary/Chest: Effort normal. No respiratory distress.  Musculoskeletal:  Able to walk, sit, and stand without assistance or difficulty  Neurological: She is alert and oriented to person, place, and time.  Psychiatric: Affect and judgment normal.      Assessment & Plan:  Post herpetic neuralgia Residual pain after resolution of shingles consistent with post-herpetic neuralgia. No improvement in pain with gabapentin 100mg  TID. Gabapentin and Lyrica first line, however cost a concern so will not switch to Lyrica. Instead, will increase dose of gabapentin. Begin with increase to 200mg  TID. If no improvement after a week, can increase further to 300mg  TID. Recommended against taking  Vicodin, and explained to patient that opiates can have dangerous side effects, especially in an 84 year old, and are not first-line treatment for her pain. Patient said she is going to continue taking Vicodin.  - F/u as needed   Adin Hector, MD, MPH PGY-3 [facility] Pager [phone]

## 2017-07-13 NOTE — Assessment & Plan Note (Signed)
Residual pain after resolution of shingles consistent with post-herpetic neuralgia. No improvement in pain with gabapentin 100mg  TID. Gabapentin and Lyrica first line, however cost a concern so will not switch to Lyrica. Instead, will increase dose of gabapentin. Begin with increase to 200mg  TID. If no improvement after a week, can increase further to 300mg  TID. Recommended against taking Vicodin, and explained to patient that opiates can have dangerous side effects, especially in an 84 year old, and are not first-line treatment for her pain. Patient said she is going to continue taking Vicodin.  - F/u as needed

## 2017-07-13 NOTE — Patient Instructions (Addendum)
It was nice meeting you today Mrs. Page!  Please increase the amount of gabapentin you are taking to two tablets (200 mg total) three times a day. If you are still having pain after a week of this, you can increase to three tablets (300 mg total) three times a day.   I would recommend against taking Vicodin, as this can have dangerous side effects for a patient of your age.   If you have any questions or concerns, please feel free to call the clinic.   Be well,  Dr. Avon Gully

## 2017-07-20 ENCOUNTER — Other Ambulatory Visit: Payer: Self-pay | Admitting: Family Medicine

## 2017-07-20 ENCOUNTER — Telehealth: Payer: Self-pay | Admitting: Family Medicine

## 2017-07-20 MED ORDER — CAPSICUM OLEORESIN 0.025 % EX CREA: 1.0000 "application " | TOPICAL_CREAM | Freq: Three times a day (TID) | CUTANEOUS | 0 refills | Status: AC

## 2017-07-20 NOTE — Progress Notes (Signed)
  Pres[facility] [address]  Lucila Maine, DO PGY-2, [facility] 07/20/2017 12:12 PM

## 2017-07-20 NOTE — Telephone Encounter (Signed)
We can try a topical cream for the pain but the gabapentin really is the best medication for her to take for the post-herpetic neuralgia. In a younger patient I would consider trying a tricyclic antidepressant but these are dangerous in the elderly. I will send in a cream for her to use topically. If she is still having pain she should be seen for an appointment.   Lucila Maine, DO PGY-2, [facility] 07/20/2017 12:11 PM

## 2017-07-20 NOTE — Telephone Encounter (Signed)
Received fax from pts pharmacy requesting a refill on pts Capsicum. Unfortunately[facility]. They are suggesting OTC Capsicum for pt. Please advise.

## 2017-07-20 NOTE — Telephone Encounter (Signed)
Pt would like a different medication than what she was given at her last appointment. She said that medication isn't working. She would like to speak with Welton Flakes or [facility].

## 2017-07-20 NOTE — Telephone Encounter (Signed)
Patient was given gabapentin at her last visit and will send this note to her pcp. Jazmin Hartsell,CMA

## 2017-07-20 NOTE — Telephone Encounter (Signed)
Patient can use over the counter capsicum cream. Thanks!

## 2017-07-21 ENCOUNTER — Other Ambulatory Visit: Payer: Self-pay

## 2017-07-21 ENCOUNTER — Ambulatory Visit (INDEPENDENT_AMBULATORY_CARE_PROVIDER_SITE_OTHER): Payer: Medicare Other | Admitting: Internal Medicine

## 2017-07-21 ENCOUNTER — Encounter: Payer: Self-pay | Admitting: Internal Medicine

## 2017-07-21 VITALS — BP 136/82 | Temp 97.5°F | Ht 65.0 in | Wt 126.4 lb

## 2017-07-21 DIAGNOSIS — R350 Frequency of micturition: Secondary | ICD-10-CM

## 2017-07-21 DIAGNOSIS — N3001 Acute cystitis with hematuria: Secondary | ICD-10-CM | POA: Diagnosis not present

## 2017-07-21 LAB — POCT URINALYSIS DIP (MANUAL ENTRY)
Bilirubin, UA: NEGATIVE
Glucose, UA: NEGATIVE mg/dL
NITRITE UA: POSITIVE — AB
PH UA: 6 (ref 5.0–8.0)
Spec Grav, UA: >=1.03 — AB (ref 1.010–1.025)
Urobilinogen, UA: 0.2 E.U./dL

## 2017-07-21 MED ORDER — CIPROFLOXACIN HCL 500 MG PO TABS: 500.0000 mg | ORAL_TABLET | Freq: Two times a day (BID) | ORAL | 0 refills | Status: DC

## 2017-07-21 NOTE — Patient Instructions (Addendum)
It was so nice to see you!  I have prescribed an antibiotic called Ciprofloxacin. Please take 1 tablet twice a day for 5 days.  Let us know if your symptoms aren't better after finishing the course of antibiotics.  -Dr. Brett Albino

## 2017-07-22 NOTE — Assessment & Plan Note (Signed)
UA consistent with infection- 2+ LE, pos nitrites, lrg RBC. No signs of pyelonephritis. Vitals WNL. Last urine culture 04/10/17 grew pan-sensitive Enterococcus. Patient has penicillin allergy and had rash with Keflex recently. - Ciprofloxacin 500mg  bid x 5 days - Urine culture ordered - Follow-up if no improvement

## 2017-07-22 NOTE — Progress Notes (Signed)
   [facility] Phone: [phone]  Subjective:  Natalie [name] is an 84 year old female presenting to clinic with dysuria, urinary frequency, and urinary urgency for the last 2 days. This feels similar to other UTIs she has had in the past. She also endorses suprapubic pain. No flank pain. No fevers. No vaginal discharge.  ROS: See HPI for pertinent positives and negatives  Past Medical History- HTN, CAD, hx TIA, colon cancer, HLD, chronic low back pain  Family history reviewed for today's visit. No changes.  Social history- patient is a former smoker  Objective: BP 136/82   Temp (!) 97.5 F (36.4 C) (Oral)   Ht 5\' 5"  (1.651 m)   Wt 126 lb 6.4 oz (57.3 kg)   BMI 21.03 kg/m  Gen: NAD, alert, cooperative with exam GI: Soft, non-distended, +suprapubic tenderness, no rebound, no guarding Back: No CVA tenderness  Assessment/Plan: UTI: UA consistent with infection- 2+ LE, pos nitrites, lrg RBC. No signs of pyelonephritis. Vitals WNL. Last urine culture 04/10/17 grew pan-sensitive Enterococcus. Patient has penicillin allergy and had rash with Keflex recently. - Ciprofloxacin 500mg  bid x 5 days - Urine culture ordered - Follow-up if no improvement   Hyman Bible, MD PGY-3

## 2017-07-24 LAB — URINE CULTURE

## 2017-07-25 NOTE — Telephone Encounter (Signed)
Attempted to contact pt to inform of OTC cream. Pt did not answer and there was no option for VM. If pt calls back, please inform her to purchase OTC Capsicum cream in replace of Gabapentin.

## 2017-08-17 ENCOUNTER — Other Ambulatory Visit: Payer: Self-pay | Admitting: *Deleted

## 2017-08-17 MED ORDER — DICLOFENAC SODIUM 1 % TD GEL: 2.0000 g | Freq: Four times a day (QID) | TRANSDERMAL | 0 refills | Status: DC

## 2017-09-25 ENCOUNTER — Other Ambulatory Visit: Payer: Self-pay

## 2017-09-25 MED ORDER — DICLOFENAC SODIUM 1 % TD GEL: 2.0000 g | Freq: Four times a day (QID) | TRANSDERMAL | 0 refills | Status: DC

## 2017-10-06 ENCOUNTER — Other Ambulatory Visit: Payer: Self-pay | Admitting: Cardiology

## 2017-10-06 DIAGNOSIS — I739 Peripheral vascular disease, unspecified: Secondary | ICD-10-CM

## 2017-10-06 DIAGNOSIS — I2510 Atherosclerotic heart disease of native coronary artery without angina pectoris: Secondary | ICD-10-CM

## 2017-10-12 ENCOUNTER — Other Ambulatory Visit: Payer: Self-pay | Admitting: Cardiology

## 2017-11-02 ENCOUNTER — Ambulatory Visit (HOSPITAL_COMMUNITY)
Admission: RE | Admit: 2017-11-02 | Discharge: 2017-11-02 | Disposition: A | Discharge status: Home / Self Care | Payer: Medicare Other | Source: Ambulatory Visit | Attending: Cardiology | Admitting: Cardiology

## 2017-11-02 DIAGNOSIS — I2510 Atherosclerotic heart disease of native coronary artery without angina pectoris: Secondary | ICD-10-CM | POA: Diagnosis not present

## 2017-11-02 DIAGNOSIS — I739 Peripheral vascular disease, unspecified: Secondary | ICD-10-CM

## 2017-11-06 ENCOUNTER — Telehealth: Payer: Self-pay | Admitting: *Deleted

## 2017-11-06 DIAGNOSIS — R6889 Other general symptoms and signs: Secondary | ICD-10-CM

## 2017-11-06 DIAGNOSIS — Z8679 Personal history of other diseases of the circulatory system: Secondary | ICD-10-CM

## 2017-11-06 NOTE — Telephone Encounter (Signed)
-----   Message from Dorothy Spark, MD sent at 11/04/2017  8:39 AM EDT ----- Resting right ankle-brachial index indicates moderate right lower extremity arterial disease.  Left: Resting left ankle-brachial index is within normal range.  Suggest follow up study in 12 months.

## 2017-11-06 NOTE — Telephone Encounter (Signed)
Spoke with the pt and informed her of her right and left ankle-brachial index results per Dr Meda Coffee.  Informed the pt that Dr Meda Coffee wants to repeat this study in one year.  Informed the pt that I will place the order in the system and have our [facility] scheduler call her back to arrange this for one year out.  Pt verbalized understanding and agrees with this plan.

## 2017-11-27 ENCOUNTER — Ambulatory Visit: Payer: Medicare Other | Admitting: Family Medicine

## 2017-11-27 ENCOUNTER — Other Ambulatory Visit: Payer: Self-pay

## 2017-11-27 VITALS — BP 110/70 | HR 77 | Temp 97.5°F | Wt 119.8 lb

## 2017-11-27 DIAGNOSIS — M25552 Pain in left hip: Secondary | ICD-10-CM | POA: Diagnosis not present

## 2017-11-27 DIAGNOSIS — M79605 Pain in left leg: Secondary | ICD-10-CM | POA: Diagnosis not present

## 2017-11-27 DIAGNOSIS — M545 Low back pain, unspecified: Secondary | ICD-10-CM

## 2017-11-27 DIAGNOSIS — R109 Unspecified abdominal pain: Secondary | ICD-10-CM

## 2017-11-27 LAB — POCT URINALYSIS DIP (MANUAL ENTRY)
BILIRUBIN UA: NEGATIVE
GLUCOSE UA: NEGATIVE mg/dL
Leukocytes, UA: NEGATIVE
Nitrite, UA: NEGATIVE
RBC UA: NEGATIVE
SPEC GRAV UA: 1.02 (ref 1.010–1.025)
Urobilinogen, UA: 0.2 E.U./dL
pH, UA: 5.5 (ref 5.0–8.0)

## 2017-11-27 MED ORDER — GABAPENTIN 300 MG PO CAPS: 300.0000 mg | ORAL_CAPSULE | Freq: Three times a day (TID) | ORAL | 0 refills | Status: DC

## 2017-11-27 NOTE — Progress Notes (Signed)
Subjective:    Patient ID: Natalie [name], female    DOB: 26-Feb-1934, 84 y.o.   MRN: [id number]   CC: back and thigh pain  HPI:  Hematuria: Patient with previous UTI treated in January. Patient  with large blood and no microscopic report. Patient denies any urinary symptoms including blood in urine or dysuria or frequency.  Back pain: - Patient reports chronic lower middle back pain for years. - Worsening over the past few months, since February - Pain is a stabbing, burning, stinging pain - Patient having limited mobility and unable to sleep well due to pain - pain radiates to thigh, but also with hip pain so not sure where it radiates  - has tried tylenol extra strength, 12 pills daily (3,00mg  total in a day)  - ROS: denies bowel or bladder incontinence  Hip Pain, L: - Patient reports having shingles in thigh in November, and this has improved - Denies any new rash - Reports L thigh pain that is a stabbing, burning and tingling- pain has been stable since seen in February - Reports that it keeps her up att ngiht, unable to walk as well as she once could - Reports pain radiating to buttocks  Smoking status reviewed  Review of Systems Per HPI, also denies recent illness, fever, chest pain, shortness of breath  Patient Active Problem List   Diagnosis Date Noted  . History of arterial disease of lower extremity 11/06/2017  . Abnormal ankle brachial index 11/06/2017  . Post herpetic neuralgia 07/13/2017  . Leg pain, lateral, left 05/17/2017  . Arthritis of foot, right, degenerative 12/27/2016  . Encounter for long-term (current) use of other high-risk medications 10/20/2015  . Neck and shoulder pain 06/19/2015  . TIA (transient ischemic attack) 05/08/2013  . Spine pain, lumbosacral 07/25/2011  . GERD 07/20/2009  . HERNIATED LUMBAR DISK WITH RADICULOPATHY 05/18/2009  . RADICULOPATHY 05/14/2009  . CAD, NATIVE VESSEL 09/22/2008  . Colon cancer ([place]) 10/18/2007  .  Hyperlipidemia 10/18/2007  . Essential hypertension, benign 10/18/2007     Objective:  BP 110/70 (BP Location: Right Arm)   Pulse 77   Temp (!) 97.5 F (36.4 C) (Oral)   Wt 119 lb 12.8 oz (54.3 kg)   SpO2 96%   BMI 19.94 kg/m  Vitals and nursing note reviewed  General: NAD, pleasant Cardiac: RRR, normal heart sounds, no murmurs Respiratory: CTAB, normal effort Back: Tender over lumbo-sacral region with limited ROM in extension, FROM otherwise L hip: normal gait, with FROM on external and internal rotation, healed rash on thigh Extremities: no edema or cyanosis. WWP. Skin: warm and dry, no rashes noted Neuro: alert and oriented, no focal deficits Psych: normal affect  Assessment & Plan:   H/o Hematuria Repeat UA today with no blood.   Spine pain, lumbosacral Will obtain DG Xray of lumbar spine given patient's history of acutely worsening pain, age and hx of spondylosis to examine for compression fractures.    Will obtain CMP to evaluate for LFT's given chronic use of high dose tylenol, and for yearly check.   Patient to try to increase gabapentin given normal kidney function on UA.   5/23: Patient reports that 300 mg nightly has greatly helped her on when called to ask how she was doing. She has been taking 1 tab nightly as it makes her tired. She is taking less tylenol and does not take the gabapentin during the day. Encouraged not to take during the day given that it causes  her drowsiness.   Leg pain, lateral, left Patient reports pain more in upper leg and hip. Will obtain Dg hip given that patient has pain in buttocks, although no limited range of motion in external and internal rotation.   Martinique Shaketa, DO Family Medicine Resident PGY-1

## 2017-11-27 NOTE — Patient Instructions (Addendum)
Thank you for coming to see me today. It was a pleasure! Today we talked about:   Your pain. We will increase your gabapentin to 300mg  three times per day. Please do not take more tylenol than you are currently taking.   I have ordered imaging for you. Please go and have an Xray of your back and hip. We will call you with your results.   Please follow-up with your primary care doctor as needed, and if you symptoms worsen or do not improve.   If you have any questions or concerns, please do not hesitate to call the office at [phone].  Take Care,   Martinique Juliene, DO

## 2017-11-28 LAB — CMP14+EGFR
A/G RATIO: 1.3 (ref 1.2–2.2)
ALT: 14 IU/L (ref 0–32)
AST: 19 IU/L (ref 0–40)
Albumin: 4.3 g/dL (ref 3.5–4.7)
Alkaline Phosphatase: 42 IU/L (ref 39–117)
BUN/Creatinine Ratio: 21 (ref 12–28)
BUN: 16 mg/dL (ref 8–27)
Bilirubin Total: <0.2 mg/dL (ref 0.0–1.2)
CALCIUM: 9.5 mg/dL (ref 8.7–10.3)
CHLORIDE: 105 mmol/L (ref 96–106)
CO2: 26 mmol/L (ref 20–29)
Creatinine, Ser: 0.77 mg/dL (ref 0.57–1.00)
GFR calc Af Amer: 83 mL/min/{1.73_m2} (ref >59)
GFR, EST NON AFRICAN AMERICAN: 72 mL/min/{1.73_m2} (ref >59)
GLOBULIN, TOTAL: 3.4 g/dL (ref 1.5–4.5)
Glucose: 86 mg/dL (ref 65–99)
POTASSIUM: 4.2 mmol/L (ref 3.5–5.2)
SODIUM: 143 mmol/L (ref 134–144)
Total Protein: 7.7 g/dL (ref 6.0–8.5)

## 2017-11-30 ENCOUNTER — Encounter: Payer: Self-pay | Admitting: Family Medicine

## 2017-11-30 NOTE — Assessment & Plan Note (Addendum)
Will obtain DG Xray of lumbar spine given patient's history of acutely worsening pain, age and hx of spondylosis to examine for compression fractures.    Will obtain CMP to evaluate for LFT's given chronic use of high dose tylenol, and for yearly check.   Patient to try to increase gabapentin given normal kidney function on UA.   5/23: Patient reports that 300 mg nightly has greatly helped her on when called to ask how she was doing. She has been taking 1 tab nightly as it makes her tired. She is taking less tylenol and does not take the gabapentin during the day. Encouraged not to take during the day given that it causes her drowsiness.

## 2017-11-30 NOTE — Assessment & Plan Note (Signed)
Patient reports pain more in upper leg and hip. Will obtain Dg hip given that patient has pain in buttocks, although no limited range of motion in external and internal rotation.

## 2017-12-05 ENCOUNTER — Other Ambulatory Visit: Payer: Self-pay | Admitting: Family Medicine

## 2017-12-05 ENCOUNTER — Ambulatory Visit
Admission: RE | Admit: 2017-12-05 | Discharge: 2017-12-05 | Disposition: A | Discharge status: Home / Self Care | Payer: Medicare Other | Source: Ambulatory Visit | Attending: Family Medicine | Admitting: Family Medicine

## 2017-12-05 DIAGNOSIS — R109 Unspecified abdominal pain: Secondary | ICD-10-CM

## 2017-12-05 DIAGNOSIS — M25552 Pain in left hip: Secondary | ICD-10-CM

## 2017-12-05 DIAGNOSIS — M545 Low back pain, unspecified: Secondary | ICD-10-CM

## 2017-12-08 ENCOUNTER — Ambulatory Visit: Payer: Medicare Other | Admitting: Internal Medicine

## 2017-12-08 ENCOUNTER — Encounter: Payer: Self-pay | Admitting: Internal Medicine

## 2017-12-08 ENCOUNTER — Other Ambulatory Visit: Payer: Self-pay

## 2017-12-08 VITALS — BP 142/78 | HR 76 | Temp 97.5°F | Ht 65.0 in | Wt 121.0 lb

## 2017-12-08 DIAGNOSIS — R21 Rash and other nonspecific skin eruption: Secondary | ICD-10-CM | POA: Diagnosis not present

## 2017-12-08 DIAGNOSIS — B0229 Other postherpetic nervous system involvement: Secondary | ICD-10-CM | POA: Diagnosis not present

## 2017-12-08 MED ORDER — AMITRIPTYLINE HCL 10 MG PO TABS: 10.0000 mg | ORAL_TABLET | Freq: Every day | ORAL | 0 refills | Status: AC

## 2017-12-08 MED ORDER — TRIAMCINOLONE ACETONIDE 0.1 % EX OINT: 1.0000 "application " | TOPICAL_OINTMENT | Freq: Two times a day (BID) | CUTANEOUS | 0 refills | Status: DC

## 2017-12-08 NOTE — Progress Notes (Signed)
   [facility] Kerrin Mo, MD Phone: [phone]  Reason For Visit: SDA for Allergy to Gabapentin   #Allergy to Gabapentin  Patient is presenting for an allergic reaction to gabapentin.  She states that she was initially taking 1 pill and had no issue.  She increased to 2 pills and developed a rash.  She notes the rash is very itchy.  She states that initially started on her neck and then she developed a couple maculopapular lesions around her left  inner thigh.  She was taking the gabapentin for her postherpetic neuralgia and states it was actually helping the pain that she had significantly.  She has since stopped taking the medication.  She denies any oral lesions.  She denies any fevers.    Past Medical History Reviewed problem list.  Medications- reviewed and updated No additions to family history Social history- patient is a non-smoker  Objective: BP (!) 142/78   Pulse 76   Temp (!) 97.5 F (36.4 C) (Oral)   Ht 5\' 5"  (1.651 m)   Wt 121 lb (54.9 kg)   SpO2 96%   BMI 20.14 kg/m  Gen: NAD, alert, cooperative with exam HEENT: Normal    Neck: No masses palpated. No lymphadenopathy    Eyes: Conjunctive are within normal limits    Nose: nasal turbinates moist    Throat: moist mucus membranes, no erythema Skin:  confluent rash noted on back of neck, 2 erythematous papular lesions noted on the left thigh  Assessment/Plan: See problem based a/p  Post herpetic neuralgia Concern for allergy to gabapentin -Will stop gabapentin - amitriptyline (ELAVIL) 10 MG tablet; Take 1 tablet (10 mg total) by mouth at bedtime.  Dispense: 30 tablet; Refill: 0 - Follow up with PCP in two weeks  Rash and nonspecific skin eruption Concern for allergic rash  - triamcinolone ointment (KENALOG) 0.1 %; Apply 1 application topically 2 (two) times daily.  Dispense: 30 g; Refill: 0 for itchy areas  - Follow up in 1 week if no resolution

## 2017-12-08 NOTE — Assessment & Plan Note (Signed)
Concern for allergic rash  - triamcinolone ointment (KENALOG) 0.1 %; Apply 1 application topically 2 (two) times daily.  Dispense: 30 g; Refill: 0 for itchy areas  - Follow up in 1 week if no resolution

## 2017-12-08 NOTE — Patient Instructions (Addendum)
Patient please take the amitriptyline for your posthepatic neuralgia at night.  Hopefully this will help with your symptoms.  I am going to prescribe you also a cream for your itchy rash -use this for up to 7 days.  I would follow-up in 2 weeks with your primary care physician regarding your postherpetic neuralgia she may want to increase your dose of amitriptyline

## 2017-12-08 NOTE — Assessment & Plan Note (Signed)
Concern for allergy to gabapentin -Will stop gabapentin - amitriptyline (ELAVIL) 10 MG tablet; Take 1 tablet (10 mg total) by mouth at bedtime.  Dispense: 30 tablet; Refill: 0 - Follow up with PCP in two weeks

## 2017-12-15 ENCOUNTER — Other Ambulatory Visit: Payer: Self-pay | Admitting: Family Medicine

## 2017-12-15 NOTE — Telephone Encounter (Signed)
Looks like the amlodipine was prescribed by her cardiologist and they tried to contact her to schedule a follow up. Can you ask her to call Dr. Francesca Oman office for refill and needs appt?

## 2017-12-18 ENCOUNTER — Other Ambulatory Visit: Payer: Self-pay | Admitting: Cardiology

## 2017-12-18 MED ORDER — AMLODIPINE BESYLATE 5 MG PO TABS: 5.0000 mg | ORAL_TABLET | Freq: Every day | ORAL | 1 refills | Status: DC

## 2017-12-18 NOTE — Telephone Encounter (Signed)
°*  STAT* If patient is at the pharmacy, call can be transferred to refill team.   1. Which medications need to be refilled? (please list name of each medication and dose if known) Amlodipine  5mg   2. Which pharmacy/location (including street and city if local pharmacy) is medication to be sent to?[facility]   3. Do they need a 30 day or 90 day supply? [place]

## 2017-12-18 NOTE — Telephone Encounter (Signed)
Patient informed of message and will contact Dr. Francesca Oman office. Jazmin Hartsell,CMA

## 2017-12-18 NOTE — Telephone Encounter (Signed)
Pt's medication was sent to pt's pharmacy as requested. Confirmation received.  °

## 2017-12-24 ENCOUNTER — Other Ambulatory Visit: Payer: Self-pay | Admitting: Family Medicine

## 2017-12-29 ENCOUNTER — Other Ambulatory Visit: Payer: Self-pay | Admitting: Cardiology

## 2017-12-29 ENCOUNTER — Other Ambulatory Visit: Payer: Self-pay | Admitting: Family Medicine

## 2018-01-04 ENCOUNTER — Other Ambulatory Visit: Payer: Self-pay | Admitting: Internal Medicine

## 2018-01-04 DIAGNOSIS — B0229 Other postherpetic nervous system involvement: Secondary | ICD-10-CM

## 2018-01-25 ENCOUNTER — Other Ambulatory Visit: Payer: Self-pay | Admitting: Cardiology

## 2018-01-26 ENCOUNTER — Encounter (INDEPENDENT_AMBULATORY_CARE_PROVIDER_SITE_OTHER): Payer: Self-pay

## 2018-01-26 ENCOUNTER — Ambulatory Visit: Payer: Medicare Other | Admitting: Cardiology

## 2018-01-26 ENCOUNTER — Telehealth: Payer: Self-pay | Admitting: *Deleted

## 2018-01-26 ENCOUNTER — Encounter: Payer: Self-pay | Admitting: Cardiology

## 2018-01-26 VITALS — BP 146/88 | HR 68 | Ht 65.0 in | Wt 119.1 lb

## 2018-01-26 DIAGNOSIS — E785 Hyperlipidemia, unspecified: Secondary | ICD-10-CM | POA: Diagnosis not present

## 2018-01-26 DIAGNOSIS — B028 Zoster with other complications: Secondary | ICD-10-CM

## 2018-01-26 DIAGNOSIS — I739 Peripheral vascular disease, unspecified: Secondary | ICD-10-CM

## 2018-01-26 DIAGNOSIS — Z8673 Personal history of transient ischemic attack (TIA), and cerebral infarction without residual deficits: Secondary | ICD-10-CM

## 2018-01-26 DIAGNOSIS — R634 Abnormal weight loss: Secondary | ICD-10-CM

## 2018-01-26 DIAGNOSIS — Z9861 Coronary angioplasty status: Secondary | ICD-10-CM

## 2018-01-26 DIAGNOSIS — I10 Essential (primary) hypertension: Secondary | ICD-10-CM | POA: Diagnosis not present

## 2018-01-26 DIAGNOSIS — I2510 Atherosclerotic heart disease of native coronary artery without angina pectoris: Secondary | ICD-10-CM

## 2018-01-26 NOTE — Telephone Encounter (Signed)
lvm to let pt know after Natalie [name] [facility] needed pt to come back for lipid/lft on same day as HTN visit.  appt made, orders in and linked. Also stated pt can eat breakfast and skip lunch due to appt being later in the pm.

## 2018-01-26 NOTE — Progress Notes (Signed)
Cardiology Office Note:    Date:  01/26/2018   ID:  Natalie [name], DOB 1933-09-28, MRN [id number]  PCP:  Steve Rattler, DO  Cardiologist: Dr. Ena Dawley, MD   Referring MD: Steve Rattler, DO   Chief Complaint  Patient presents with  . Hypertension   History of Present Illness:    Natalie [name] is a 84 y.o. female with a hx of CAD with remote PCI to the RCA in 2007, TIA 2014, HTN, HLD and GERD who presents today for her one year follow up, seen for Dr. Meda Coffee. Her last stress test was in 08/2011 which demonstrated an LVEF of 78% with no ischemia. After her TIA, Carotid Dopplers performed which showed 0-39% stenosis bilaterally. Echocardiogram with no embolic source however with a new G1DD. She was placed on ASA 81mg  and Plavix. She was also started on pravastatin but did not tolerate due to muscle pain. Since then, she has had some complaints of dyspnea on exertion with chest tightness in 2017 in which she underwent an exercise stress test that showed hyperdynamic LV function with no scar or ischemia but hypertensive response to stress. She was started on low-dose of ACE inhibitor however developed significant side effects and was changed to amlodipine 2.5 mg daily which was later increased to 5 mg daily with improvement. She was last seen 10/2016 by Dr. Meda Coffee and had recently recovered from bronchitis, but overall was doing well. She did have complaints of mild claudication and was therefore set up for LE arterial ultrasound initially performed on 10/2016 which showed right lower arterial disease. Subsequent doppler performed 10/2017 with worsening right LE disease. Recommendations were to repeat study in one year, 2020.   Today, she denies chest pain, palpitations, SOB, LE swelling, orthopnea, dizziness,or  syncope. She complains mostly of herpetic/neuralgia pain in her left hip from a recent shingles outbreak in March of this year. She denies symptoms of  claudication in either lower extremity. She reports that her shortness of breath has been better. She was initially prescribed Anoro-Ellipta by her PCP which helped, however she has been unable to afford the medicine further. Despite not taking the medicine in quite a few months, she has been able to work in her garden and do other activities without respiratory complication. She does say that her appetite has been down over the last year or so and that she has lost weight. Her weight at her last office visit with [place] was noted to be 133lb and she is down to 119lb today. She says that she has been eating less meat, mainly because it is not satisfying to her, however due to her recent loss, she has been making an effort to increase her calories.   Past Medical History:  Diagnosis Date  . Back pain    with radiculopathy  . CAD (coronary artery disease)    prior DES Jan 2007 with low risk myoview in 2008  . Colon cancer ([place])    prior adenocarcinoma of cecum in 2000 s/p chemo and colon resection  . GERD (gastroesophageal reflux disease)   . Glaucoma suspect   . HTN (hypertension)   . Hyperlipidemia   . Shingles 06/08/2017  . TIA (transient ischemic attack)   . UTI (urinary tract infection)     Past Surgical History:  Procedure Laterality Date  . ABDOMINAL HYSTERECTOMY    . BACK SURGERY    . CORONARY STENT PLACEMENT  Jan 2007   DES to RCA  .  Microdiscectomy     L4-L5    Current Medications: Current Meds  Medication Sig  . acetaminophen (TYLENOL) 650 MG CR tablet Take 650 mg by mouth every 8 (eight) hours as needed. For pain  . amLODipine (NORVASC) 5 MG tablet Take 1 tablet (5 mg total) by mouth daily. Please keep upcoming appt in July for future refills. Thank you  . aspirin EC 81 MG tablet Take 81 mg by mouth daily.  . capsicum oleoresin (TRIXAICIN) 0.025 % cream Apply 1 application topically 3 (three) times daily.  . clopidogrel (PLAVIX) 75 MG tablet TAKE 1 TABLET (75 MG TOTAL) BY  MOUTH DAILY.  Marland Kitchen diclofenac sodium (VOLTAREN) 1 % GEL APPLY 2 GRAMS TO AFFECTED AREA 4 TIMES A DAY  . metoprolol tartrate (LOPRESSOR) 50 MG tablet Take 1 tablet (50 mg total) by mouth 2 (two) times daily.  . nitroGLYCERIN (NITROSTAT) 0.4 MG SL tablet Place 1 tablet (0.4 mg total) under the tongue every 5 (five) minutes as needed for chest pain.  Marland Kitchen umeclidinium-vilanterol (ANORO ELLIPTA) 62.5-25 MCG/INH AEPB Inhale 1 puff into the lungs daily.     Allergies:   Claritin [loratadine]; Crestor [rosuvastatin calcium]; Lipitor [atorvastatin]; Oxycontin [oxycodone hcl]; Gabapentin; and Penicillins   Social History   Socioeconomic History  . Marital status: Married    Spouse name: Not on file  . Number of children: Not on file  . Years of education: Not on file  . Highest education level: Not on file  Occupational History  . Not on file  Social Needs  . Financial resource strain: Not on file  . Food insecurity:    Worry: Not on file    Inability: Not on file  . Transportation needs:    Medical: Not on file    Non-medical: Not on file  Tobacco Use  . Smoking status: Former Smoker    Packs/day: 0.50    Years: 38.00    Pack years: 19.00    Types: Cigarettes    Start date: 07/21/1955    Last attempt to quit: 08/25/1993    Years since quitting: 24.4  . Smokeless tobacco: Never Used  Substance and Sexual Activity  . Alcohol use: No  . Drug use: No  . Sexual activity: Yes    Partners: Male  Lifestyle  . Physical activity:    Days per week: Not on file    Minutes per session: Not on file  . Stress: Not on file  Relationships  . Social connections:    Talks on phone: Not on file    Gets together: Not on file    Attends religious service: Not on file    Active member of club or organization: Not on file    Attends meetings of clubs or organizations: Not on file    Relationship status: Not on file  Other Topics Concern  . Not on file  Social History Narrative   She is a retired  Consulting civil engineer.    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         Current Social History   12/20/2013   Who lives at home: Husband Karie Soda and daughter Elmo Putt 1/61/0960    Transportation: Has own transportation 12/20/2016   Important Relationships & Pets: Husband, Elmo Putt, "all my children", grandchildren; No pets 12/20/2016    Current Stressors: 29 yo granddaughter with [employer] who sometimes calls in the middle of the night. 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 Ducatte, RN, BSN                                                                                                         Family History: The patient's family history includes Diabetes in her brother; Healthy in her son; Stroke in her father.  ROS:   Please see the history of present illness.    All other systems reviewed and are negative.  EKGs/Labs/Other Studies Reviewed:    The following studies were reviewed today:  US Carotids: 05/08/2013 - The vertebral arteries appear patent with antegrade flow. - Findings consistent with 1-39 percent stenosis involving the right internal carotid artery and the left internal carotid artery. - ICA/CCA ratio. right =1.4. left = 1.39 Other specific details can be found in the table(s) above. Prepared and Electronically Authenticated by  Echocardiogram 10/12/2016  - Left ventricle: The cavity size was normal. Wall thickness was normal. Systolic function was vigorous. The estimated ejection fraction was in the range of 65% to 70%. Wall motion was normal; there were no regional wall motion abnormalities. Doppler parameters are consistent with abnormal left  ventricular relaxation (grade 1 diastolic dysfunction). The E/e&' ratio is between 8-15, suggesting indeterminate LV filling pressure. - Mitral valve: Calcified annulus. Mildly thickened leaflets . There was trivial regurgitation. - Left atrium: The atrium was normal in size. - Inferior vena cava: The vessel was normal in size. The respirophasic diameter changes were in the normal range (>= 50%), consistent with normal central venous pressure.  Impressions: - LVEF 65-70%, normal awll thicknes, normal wall motion, grade 1 DD with indeterminate LV filling pressure, trivial MR, normal LA size, normal IVC.  Accessory Clinical Findings  TTE: 10/13/2015   Nuclear stress EF: 71%.  ST segment depression was noted during stress.  This is a low risk study.  The left ventricular ejection fraction is hyperdynamic (>65%).  Low risk stress nuclear study with a small, moderate intensity, fixed basal inferior lateral defect consistent with thinning; no ischemia; EF 71 with normal wall motion.  Lower extremity vascular ultrasound with ABI: 11/02/2017 Final Interpretation: Right: Resting right ankle-brachial index indicates moderate right lower extremity arterial disease. The right toe-brachial index is abnormal.  Left: Resting left ankle-brachial index is within normal range. No evidence of significant left lower extremity arterial disease. The left toe-brachial index is abnormal.  EKG:  EKG is ordered today.  The ekg ordered today demonstrates NSR with HR 68, comparable to prior tracings  Recent Labs: 11/27/2017: ALT 14; BUN 16; Creatinine, Ser 0.77; Potassium 4.2; Sodium 143  Recent Lipid Panel    Component Value Date/Time   CHOL 197 05/26/2016 1011   TRIG 145 05/26/2016 1011   HDL 61 05/26/2016 1011   CHOLHDL 3.2 05/26/2016 1011   VLDL 29 05/26/2016 1011   LDLCALC 107 (H) 05/26/2016 1011  LDLDIRECT 143.7 07/30/2007 0950    Physical Exam:    VS:  BP (!)  146/88   Pulse 68   Ht 5\' 5"  (1.651 m)   Wt 119 lb 1.9 oz (54 kg)   SpO2 97%   BMI 19.82 kg/m     Wt Readings from Last 3 Encounters:  01/26/18 119 lb 1.9 oz (54 kg)  12/08/17 121 lb (54.9 kg)  11/27/17 119 lb 12.8 oz (54.3 kg)    General: Frail, elderly, NAD Skin: Warm, dry, intact  Head: Normocephalic, atraumatic, clear, moist mucus membranes. Neck: Negative for carotid bruits. No JVD Lungs:Clear to ausculation bilaterally. No wheezes, rales, or rhonchi. Breathing is unlabored. Cardiovascular: RRR with S1 S2. No murmurs, rubs or gallops Abdomen: Soft, non-tender, non-distended with normoactive bowel sounds. No obvious abdominal masses. MSK: Strength and tone appear normal for age. 5/5 in all extremities Extremities: No edema. No clubbing or cyanosis. DP/PT pulses 1+ bilaterally Neuro: Alert and oriented. No focal deficits. No facial asymmetry. MAE spontaneously. Psych: Responds to questions appropriately with normal affect.    ASSESSMENT:    1. Essential hypertension, benign   2. Hyperlipidemia, unspecified hyperlipidemia type   3. Weight loss, unintentional   4. Claudication in peripheral vascular disease ([place])   5. CAD S/P percutaneous coronary angioplasty   6. Hx of TIA (transient ischemic attack) and stroke   7. Herpes zoster with complication     PLAN:    In order of problems listed above:  1. Hypertension: -Mild elevation. She is unclear as to what her BP's run at home however thinks that they are higher than in the office today -No medication changes today. Will have her return for HTN clinic for BP check in approximately 2 weeks and adjust as needed  -Continue current regimen of Amlodipine 5, Metoprolol 50  2. Hyperlipidemia: -Last LDL 107. She is unfortunately intolerant to statins. She has tried pravastatin, atorvastatin, and rosuvastatin with problems. She reports PCSK 9 inhibitors are not an option secondary to increased cost. -She states that she has not  been taking her Zetia for unknown reason.  -Will check lipid panel when she returns for BP check. If LDL elevated, will have her restart Zetia.  3. Weight loss: -Pt noted to have decreased appetite and weight loss over the last year -Last office appointment, weight 133lb 10/2016, down to 119lb today, 01/2018 -Encouraged protein supplementation during the day with regular meals -Recommended that she follow with her PCP for further workup  4. Claudication: -LE doppler with ABI's performed on 10/2016 with right LE PV disease. Repeat dopplers with ABI's 11/02/17 with worsening right lE disease however she has not recent complaint of claudication. Follow up recommendations per Dr. Meda Coffee were to repeat dopplers and ABI's in one year given that she is asymptomatic.  -Continue ASA, Plavix -If right LE ABI continues to worsen or with lifestyle inhibiting claudication, may need to be referred for PV workup   5. CAD s/p remote PCI to RCA: -Last stress test 10/2015 which was negtaive although with hypertensive response to stress>>corrected with change in medications  -Most recent echocardiogram with LVEF 65% with NWA and G1DD -Denies ACS symptoms today  -Continue ASA, Metoprolol   6. Hx of TIA: -Prior history of TIA 2014 -No symptoms today -Continue Plavix, ASA   7. Recent shingles outbreak with residual pain: -Reports recent shingles outbreak over her left hip with residual pain. Apparently she has had some complication with the gabapentin and is no longer taking  that.  -It was recommended that she return to her PCP for further follow up regarding her neuralgia.    Medication Adjustments/Labs and Tests Ordered: Current medicines are reviewed at length with the patient today.  Concerns regarding medicines are outlined above.  Orders Placed This Encounter  Procedures  . Lipid Profile  . Hepatic function panel  . EKG 12-Lead   No orders of the defined types were placed in this  encounter.  Patient Instructions  Medication Instructions:  Your physician recommends that you continue on your current medications as directed. Please refer to the Current Medication list given to you today.   Labwork: -None  Testing/Procedures: -None  Follow-Up: Your physician wants you to follow-up in: 6 months with Dr. Meda Coffee.  You will receive a reminder letter in the mail two months in advance. If you don't receive a letter, please call our office to schedule the follow-up appointment.  Your physician recommends that you keep your scheduled  follow-up appointment with the Pharmacist regarding BP check.   Any Other Special Instructions Will Be Listed Below (If Applicable).     If you need a refill on your cardiac medications before your next appointment, please call your pharmacy.      Signed, Kathyrn Drown, NP  01/26/2018 10:34 AM    [place]

## 2018-01-26 NOTE — Patient Instructions (Signed)
Medication Instructions:  Your physician recommends that you continue on your current medications as directed. Please refer to the Current Medication list given to you today.   Labwork: -None  Testing/Procedures: -None  Follow-Up: Your physician wants you to follow-up in: 6 months with Dr. Meda Coffee.  You will receive a reminder letter in the mail two months in advance. If you don't receive a letter, please call our office to schedule the follow-up appointment.  Your physician recommends that you keep your scheduled  follow-up appointment with the Pharmacist regarding BP check.   Any Other Special Instructions Will Be Listed Below (If Applicable).     If you need a refill on your cardiac medications before your next appointment, please call your pharmacy.

## 2018-02-05 ENCOUNTER — Telehealth: Payer: Self-pay | Admitting: *Deleted

## 2018-02-05 NOTE — Telephone Encounter (Signed)
Thank you for letting me know

## 2018-02-05 NOTE — Telephone Encounter (Signed)
Pt calls stating that her BP is high and she would like to be seen.  She says that her Bp is 150's / 80's - 90's  Pt c/o of CP and SOB.  Advised to go to the ED since she was having those symptoms. [place], Salome Spotted, CMA

## 2018-02-11 ENCOUNTER — Other Ambulatory Visit: Payer: Self-pay | Admitting: Cardiology

## 2018-02-13 ENCOUNTER — Ambulatory Visit (INDEPENDENT_AMBULATORY_CARE_PROVIDER_SITE_OTHER): Payer: Medicare Other | Admitting: Pharmacist

## 2018-02-13 ENCOUNTER — Other Ambulatory Visit: Payer: Medicare Other

## 2018-02-13 VITALS — BP 124/66 | HR 67

## 2018-02-13 DIAGNOSIS — I10 Essential (primary) hypertension: Secondary | ICD-10-CM

## 2018-02-13 DIAGNOSIS — E785 Hyperlipidemia, unspecified: Secondary | ICD-10-CM

## 2018-02-13 NOTE — Patient Instructions (Addendum)
It was great seeing you today!  Continue current hypertension regimen with amlodipine 5 mg once daily and metoprolol tartrate 50 mg twice daily. Blood pressure goal <130/80.   Continue to monitor your blood pressure at home about an hour AFTER taking medications and breakfast. Try not to check your blood pressure right after having stressful conversations or when you are having severe pain. Check your blood pressure after you have relaxed and sat down for about 5-10 minutes. Try to also write down your heart rate (goal of 60-100). Bring your cuff at the next visit so that we can check the accuracy.   Try to drink more water because of warm weather.  Follow-up in hypertension clinic on 03/20/2018 at 1:30 pm.

## 2018-02-13 NOTE — Progress Notes (Signed)
Patient ID: Natalie [name]                 DOB: 10-02-1933                      MRN: [id number]     HPI: Natalie [name] is a 84 y.o. female patient of Dr. Meda Coffee who presents today for hypertension evaluation. PMH significant for CAD with remote PCI to the RCA in 2007, TIA 2014, HTN, HLD and GERD. Her last stress test was in 08/2011 which demonstrated an LVEF of 78% with no ischemia. At her most recent OV with Kathyrn Drown, NP no medication changes were made as unsure if pressures at home were controlled.    Patient presents for BP management. Patient endorses lots of pain. Has also had a particularly stressful week with family. Endorses having "palpitations" (uncomfortable feelings) in her chest that started recently. Also has and "dull pain" in her chest after drinking water. Thinks it may be triggered by stress. Recommend that she follow-up with PCP for the strange chest feelings. Denies headache. Endorses SOB on exertion (going up the stairs, in the kitchen) and dizziness (while gardening). Rarely forgets to take medications.   Current HTN meds:  Amlodipine 5mg  daily  Metoprolol tartrate 50mg  BID  Previously tried: lisinopril (upset stomach), HCTZ (hypokalemia)  BP goal: <130/80  Family History: Diabetes in her brother; Healthy in her son; Stroke in her father.  Social History: former smoker, denies alcohol   Diet: Most meals are at home. Uses no salt in cooking. Endorses eating a lot of vegetables, and little meat. Drinks green tea occasionally. Mostly drinks water and sometimes juice.  Exercise: gardens, also goes up and down stairs multiple times a day at home  Home BP readings: has home arm cuff, recently bought about 6 months ago, equate brand. Has taken cuff in before to check accuracy. SBPs in the 160s and DBPs in the 90s.   Wt Readings from Last 3 Encounters:  01/26/18 119 lb 1.9 oz (54 kg)  12/08/17 121 lb (54.9 kg)  11/27/17 119 lb 12.8 oz (54.3 kg)   BP  Readings from Last 3 Encounters:  02/13/18 124/66  01/26/18 (!) 146/88  12/08/17 (!) 142/78   Pulse Readings from Last 3 Encounters:  02/13/18 67  01/26/18 68  12/08/17 76    Renal function: CrCl cannot be calculated (Patient's most recent lab result is older than the maximum 21 days allowed.).  Past Medical History:  Diagnosis Date  . Back pain    with radiculopathy  . CAD (coronary artery disease)    prior DES Jan 2007 with low risk myoview in 2008  . Colon cancer ([place])    prior adenocarcinoma of cecum in 2000 s/p chemo and colon resection  . GERD (gastroesophageal reflux disease)   . Glaucoma suspect   . HTN (hypertension)   . Hyperlipidemia   . Shingles 06/08/2017  . TIA (transient ischemic attack)   . UTI (urinary tract infection)     Current Outpatient Medications on File Prior to Visit  Medication Sig Dispense Refill  . acetaminophen (TYLENOL) 500 MG tablet Take 1,500 mg by mouth 3 (three) times daily as needed for moderate pain.    Marland Kitchen amLODipine (NORVASC) 5 MG tablet Take 1 tablet (5 mg total) by mouth daily. 90 tablet 3  . aspirin EC 81 MG tablet Take 81 mg by mouth daily.    . clopidogrel (PLAVIX) 75 MG tablet TAKE  1 TABLET (75 MG TOTAL) BY MOUTH DAILY. 90 tablet 2  . diclofenac sodium (VOLTAREN) 1 % GEL APPLY 2 GRAMS TO AFFECTED AREA 4 TIMES A DAY 100 g 0  . metoprolol tartrate (LOPRESSOR) 50 MG tablet Take 1 tablet (50 mg total) by mouth 2 (two) times daily. 60 tablet 0  . nitroGLYCERIN (NITROSTAT) 0.4 MG SL tablet Place 1 tablet (0.4 mg total) under the tongue every 5 (five) minutes as needed for chest pain. 25 tablet 4  . amitriptyline (ELAVIL) 10 MG tablet Take 1 tablet (10 mg total) by mouth at bedtime. (Patient not taking: Reported on 01/26/2018) 30 tablet 0  . capsicum oleoresin (TRIXAICIN) 0.025 % cream Apply 1 application topically 3 (three) times daily. (Patient not taking: Reported on 02/13/2018) 56.6 g 0  . umeclidinium-vilanterol (ANORO ELLIPTA) 62.5-25  MCG/INH AEPB Inhale 1 puff into the lungs daily. (Patient not taking: Reported on 02/13/2018) 4 each 0   No current facility-administered medications on file prior to visit.     Allergies  Allergen Reactions  . Claritin [Loratadine] Other (See Comments)    Headaches, Sinus pain, Swelling of the sinuses  . Crestor [Rosuvastatin Calcium] Other (See Comments)    Muscle aches and pains   . Lipitor [Atorvastatin] Other (See Comments)    Muscle aches on 40 mg daily  . Oxycontin [Oxycodone Hcl] Nausea And Vomiting  . Gabapentin   . Penicillins Rash    Blood pressure 124/66, pulse 67, SpO2 99 %.   Assessment/Plan: Hypertension: BP currently controlled. Continue current BP regimen. Educated patient on drinking more water and how pain affects BP. Told patient to continue to monitor her BP at home and try to bring her cuff in at the next visit so the accuracy can be checked.  Follow-up in HTN clinic on 03/20/2018.   Thank you, Lelan Pons. Patterson Hammersmith, [facility]  02/13/2018 2:43 PM  Seen with Danella Penton, PharmD Candidate [phone]

## 2018-02-14 LAB — HEPATIC FUNCTION PANEL
ALT: 7 IU/L (ref 0–32)
AST: 16 IU/L (ref 0–40)
Albumin: 4.2 g/dL (ref 3.5–4.7)
Alkaline Phosphatase: 42 IU/L (ref 39–117)
BILIRUBIN TOTAL: 0.3 mg/dL (ref 0.0–1.2)
Bilirubin, Direct: 0.08 mg/dL (ref 0.00–0.40)
TOTAL PROTEIN: 7.6 g/dL (ref 6.0–8.5)

## 2018-02-14 LAB — LIPID PANEL
CHOL/HDL RATIO: 3.8 ratio (ref 0.0–4.4)
Cholesterol, Total: 235 mg/dL — ABNORMAL HIGH (ref 100–199)
HDL: 62 mg/dL (ref >39)
LDL CALC: 134 mg/dL — AB (ref 0–99)
TRIGLYCERIDES: 194 mg/dL — AB (ref 0–149)
VLDL Cholesterol Cal: 39 mg/dL (ref 5–40)

## 2018-02-16 ENCOUNTER — Other Ambulatory Visit: Payer: Self-pay | Admitting: Family Medicine

## 2018-02-19 ENCOUNTER — Other Ambulatory Visit: Payer: Self-pay | Admitting: Family Medicine

## 2018-02-20 ENCOUNTER — Other Ambulatory Visit: Payer: Self-pay

## 2018-02-20 MED ORDER — CLOPIDOGREL BISULFATE 75 MG PO TABS: 75.0000 mg | ORAL_TABLET | Freq: Every day | ORAL | 3 refills | Status: DC

## 2018-02-26 ENCOUNTER — Other Ambulatory Visit: Payer: Self-pay | Admitting: Cardiology

## 2018-03-20 ENCOUNTER — Ambulatory Visit: Payer: Medicare Other

## 2018-03-30 ENCOUNTER — Telehealth: Payer: Self-pay

## 2018-03-30 NOTE — Telephone Encounter (Signed)
Pt LVM on nurse line requesting an apt to be seen today. I called pt back to get more information. Pt stated she has been having diarrhea q1-2 hours since 430am, Pt stated she feels a bit nauseous but denies any vomiting. Pt stated she has been in contact with her niece recently and she has the same thing. I informed patient we do not have any apts today and it seems like she has caught a stomach virus. I advised pt to continue to drink a lot of water and to try some imodium. If sxs do not improve over the next day or so, or worsen, please go to urgent care.

## 2018-04-03 ENCOUNTER — Ambulatory Visit: Payer: Medicare Other | Admitting: Family Medicine

## 2018-04-03 VITALS — BP 156/78 | HR 76 | Temp 97.7°F | Ht 65.0 in | Wt 117.6 lb

## 2018-04-03 DIAGNOSIS — N3001 Acute cystitis with hematuria: Secondary | ICD-10-CM

## 2018-04-03 DIAGNOSIS — R300 Dysuria: Secondary | ICD-10-CM

## 2018-04-03 DIAGNOSIS — Z0000 Encounter for general adult medical examination without abnormal findings: Secondary | ICD-10-CM | POA: Diagnosis not present

## 2018-04-03 LAB — POCT URINALYSIS DIP (MANUAL ENTRY)
Bilirubin, UA: NEGATIVE
Glucose, UA: NEGATIVE mg/dL
Ketones, POC UA: NEGATIVE mg/dL
Nitrite, UA: POSITIVE — AB
PH UA: 5.5 (ref 5.0–8.0)
SPEC GRAV UA: 1.015 (ref 1.010–1.025)
UROBILINOGEN UA: 0.2 U/dL

## 2018-04-03 MED ORDER — SULFAMETHOXAZOLE-TRIMETHOPRIM 800-160 MG PO TABS: 1.0000 | ORAL_TABLET | Freq: Two times a day (BID) | ORAL | 0 refills | Status: AC

## 2018-04-03 NOTE — Progress Notes (Signed)
   Subjective:    Patient ID: Natalie [name], female    DOB: 1933/12/30, 84 y.o.   MRN: [id number]   CC: dysuria   HPI: Dysuria Patient presenting today with complaints of dysuria.  States that symptoms began 1 week ago and are very similar to her previous UTIs.  Patient states that it hurts when she pees, is not necessarily a burning sensation however.  Reports urinary frequency and urgency as well.  Reports odor change in color change as well.  Denies any hematuria but thinks she may have seen blood 1 day.  Patient has had UTIs in the past and this feels very similar to slightly worse.  Patient denies any fevers but does notice at nighttime she gets hot on occasion.  Denies any new back pain, patient has chronic back pain due to postherpetic neuralgia.  Denies any vaginal discharge or odor.   Objective:  BP (!) 156/78   Pulse 76   Temp 97.7 F (36.5 C) (Oral)   Ht 5\' 5"  (1.651 m)   Wt 117 lb 9.6 oz (53.3 kg)   SpO2 97%   BMI 19.57 kg/m  Vitals and nursing note reviewed  General: well nourished, in no acute distress HEENT: normocephalic, moist mucous membranes  Cardiac: RRR, clear S1 and S2, no murmurs, rubs, or gallops Respiratory: clear to auscultation bilaterally, no increased work of breathing Abdomen: soft, nontender, nondistended, no masses or organomegaly.   Extremities: no edema or cyanosis. Warm, well perfused. MSK: No CVA tenderness  Skin: warm and dry, no rashes noted Neuro: alert and oriented, no focal deficits   Assessment & Plan:    UTI (urinary tract infection) UA consistent with UTI showing positive nitrites, small leukocytes.  Small amount of blood also present.  Signs of pyelonephritis at this time.  Patient with penicillin allergy and rash with Keflex use.  Previous urine culture showing pansensitive enterococcus.  We will plan to treat with Bactrim given black box warning to not use fluoroquinolones for simple UTIs.  Urine culture also ordered.   Strict return precautions given.  Advised to follow-up if no improvement.  Health care maintenance Patient refusing flu shot today. States she is still having pain from post herpetic neuralgias and with current UTI she would not like to obtain today. States she will come back in in October to obtain flu vaccine.     Return in about 2 weeks (around 04/17/2018), or if symptoms worsen or fail to improve.   Caroline More, DO, PGY-2

## 2018-04-03 NOTE — Assessment & Plan Note (Signed)
UA consistent with UTI showing positive nitrites, small leukocytes.  Small amount of blood also present.  Signs of pyelonephritis at this time.  Patient with penicillin allergy and rash with Keflex use.  Previous urine culture showing pansensitive enterococcus.  We will plan to treat with Bactrim given black box warning to not use fluoroquinolones for simple UTIs.  Urine culture also ordered.  Strict return precautions given.  Advised to follow-up if no improvement.

## 2018-04-03 NOTE — Patient Instructions (Signed)
Urinary Tract Infection, Adult A urinary tract infection (UTI) is an infection of any part of the urinary tract. The urinary tract includes the:  Kidneys.  Ureters.  Bladder.  Urethra.  These organs make, store, and get rid of pee (urine) in the body. Follow these instructions at home:  Take over-the-counter and prescription medicines only as told by your doctor.  If you were prescribed an antibiotic medicine, take it as told by your doctor. Do not stop taking the antibiotic even if you start to feel better.  Avoid the following drinks: ? Alcohol. ? Caffeine. ? Tea. ? Carbonated drinks.  Drink enough fluid to keep your pee clear or pale yellow.  Keep all follow-up visits as told by your doctor. This is important.  Make sure to: ? Empty your bladder often and completely. Do not to hold pee for long periods of time. ? Empty your bladder before and after sex. ? Wipe from front to back after a bowel movement if you are female. Use each tissue one time when you wipe. Contact a doctor if:  You have back pain.  You have a fever.  You feel sick to your stomach (nauseous).  You throw up (vomit).  Your symptoms do not get better after 3 days.  Your symptoms go away and then come back. Get help right away if:  You have very bad back pain.  You have very bad lower belly (abdominal) pain.  You are throwing up and cannot keep down any medicines or water. This information is not intended to replace advice given to you by your health care provider. Make sure you discuss any questions you have with your health care provider. Document Released: 12/14/2007 Document Revised: 12/03/2015 Document Reviewed: 05/18/2015 Elsevier Interactive Patient Education  [employer].  It was a pleasure seeing you today.   Today we discussed your urinary tract infection  For your urinary tract infection: I will use an antibiotic called bactrim. Use this twice a day for 7 days.   If  worsening symptoms or blood in urine come back in. If fevers or severe back pain develop come back in or to the emergency room.   Please follow up in 2 weeks if no improvement or sooner if symptoms persist or worsen. Please call the clinic immediately if you have any concerns.   Our clinic's number is [phone]. Please call with questions or concerns.    Thank you,  Caroline More, DO

## 2018-04-03 NOTE — Assessment & Plan Note (Signed)
Patient refusing flu shot today. States she is still having pain from post herpetic neuralgias and with current UTI she would not like to obtain today. States she will come back in in October to obtain flu vaccine.

## 2018-04-06 LAB — URINE CULTURE

## 2018-04-11 ENCOUNTER — Telehealth: Payer: Self-pay

## 2018-04-11 NOTE — Telephone Encounter (Signed)
Called and informed patient of lab results per Dr. Tammi Klippel. Informed patient that if the medication did not relieve her symptoms she was to call and schedule and appointment. Patient states that she took her last pill this morning and that she will give it a few more days because she can still feel some issues going on there.  Natalie [name], [place]

## 2018-05-22 ENCOUNTER — Ambulatory Visit: Payer: Medicare Other

## 2018-06-05 ENCOUNTER — Encounter: Payer: Self-pay | Admitting: Family Medicine

## 2018-06-05 ENCOUNTER — Ambulatory Visit (HOSPITAL_COMMUNITY)
Admission: RE | Admit: 2018-06-05 | Discharge: 2018-06-05 | Disposition: A | Discharge status: Home / Self Care | Payer: Medicare Other | Source: Ambulatory Visit | Attending: Family Medicine | Admitting: Family Medicine

## 2018-06-05 ENCOUNTER — Other Ambulatory Visit: Payer: Self-pay

## 2018-06-05 ENCOUNTER — Ambulatory Visit: Payer: Medicare Other | Admitting: Family Medicine

## 2018-06-05 VITALS — BP 110/60 | HR 66 | Wt 116.0 lb

## 2018-06-05 DIAGNOSIS — R21 Rash and other nonspecific skin eruption: Secondary | ICD-10-CM | POA: Diagnosis not present

## 2018-06-05 DIAGNOSIS — M79601 Pain in right arm: Secondary | ICD-10-CM | POA: Insufficient documentation

## 2018-06-05 DIAGNOSIS — M25511 Pain in right shoulder: Secondary | ICD-10-CM | POA: Diagnosis not present

## 2018-06-05 DIAGNOSIS — G8929 Other chronic pain: Secondary | ICD-10-CM | POA: Insufficient documentation

## 2018-06-05 DIAGNOSIS — Z23 Encounter for immunization: Secondary | ICD-10-CM

## 2018-06-05 MED ORDER — DICLOFENAC SODIUM 1 % TD GEL: TRANSDERMAL | 0 refills | Status: AC

## 2018-06-05 NOTE — Progress Notes (Signed)
Subjective:     Patient ID: Natalie [name], female   DOB: 04-13-1934, 84 y.o.   MRN: [id number]  Arm Pain   Incident onset: More than 1 months ago. Worsen yesterday. Incident location: She slept wrong on her right side which aggravated the pain. There was no injury mechanism. The pain is present in the right shoulder and upper right arm. The quality of the pain is described as aching. The pain does not radiate. The pain is at a severity of 8/10 (Yesterday it was 10/10 in severity). The pain is moderate. The pain has been worsening since the incident. Pertinent negatives include no chest pain, muscle weakness, numbness or tingling. Associated symptoms comments: No weakness, tingling or numbness. The symptoms are aggravated by movement and lifting. She has tried rest for the symptoms. The treatment provided mild relief.  Right knee pain: Patient stated that she recently got treated for shingles of her left hip. Still having some pain but better now. The bumps and pain only comes at night. She is now concern about small bumps and burning sensation over her right knee ongoing for more that 1 week. She is concern she is having shingles again.  Current Outpatient Medications on File Prior to Visit  Medication Sig Dispense Refill  . acetaminophen (TYLENOL) 500 MG tablet Take 1,500 mg by mouth 3 (three) times daily as needed for moderate pain.    Marland Kitchen amitriptyline (ELAVIL) 10 MG tablet Take 1 tablet (10 mg total) by mouth at bedtime. (Patient not taking: Reported on 01/26/2018) 30 tablet 0  . amLODipine (NORVASC) 5 MG tablet Take 1 tablet (5 mg total) by mouth daily. 90 tablet 3  . aspirin EC 81 MG tablet Take 81 mg by mouth daily.    . capsicum oleoresin (TRIXAICIN) 0.025 % cream Apply 1 application topically 3 (three) times daily. (Patient not taking: Reported on 02/13/2018) 56.6 g 0  . clopidogrel (PLAVIX) 75 MG tablet Take 1 tablet (75 mg total) by mouth daily. 90 tablet 3  . diclofenac sodium  (VOLTAREN) 1 % GEL APPLY 2 GRAMS TO AFFECTED AREA 4 TIMES A DAY 100 g 0  . metoprolol tartrate (LOPRESSOR) 50 MG tablet TAKE 1 TABLET BY MOUTH TWICE A DAY 60 tablet 11  . nitroGLYCERIN (NITROSTAT) 0.4 MG SL tablet Place 1 tablet (0.4 mg total) under the tongue every 5 (five) minutes as needed for chest pain. 25 tablet 4  . umeclidinium-vilanterol (ANORO ELLIPTA) 62.5-25 MCG/INH AEPB Inhale 1 puff into the lungs daily. (Patient not taking: Reported on 02/13/2018) 4 each 0   No current facility-administered medications on file prior to visit.    Past Medical History:  Diagnosis Date  . Back pain    with radiculopathy  . CAD (coronary artery disease)    prior DES Jan 2007 with low risk myoview in 2008  . Colon cancer ([name])    prior adenocarcinoma of cecum in 2000 s/p chemo and colon resection  . GERD (gastroesophageal reflux disease)   . Glaucoma suspect   . HTN (hypertension)   . Hyperlipidemia   . Shingles 06/08/2017  . TIA (transient ischemic attack)   . UTI (urinary tract infection)    Vitals:   06/05/18 1053  BP: 110/60  Pulse: 66  SpO2: 97%  Weight: 116 lb (52.6 kg)    Review of Systems  Respiratory: Negative.   Cardiovascular: Negative.  Negative for chest pain.  Gastrointestinal: Negative.   Musculoskeletal: Positive for arthralgias.  Skin:  Skin lesion  Neurological: Negative for tingling and numbness.  All other systems reviewed and are negative.      Objective:   Physical Exam  Constitutional: She appears well-developed. No distress.  Cardiovascular: Normal rate, regular rhythm and normal heart sounds.  No murmur heard. Pulmonary/Chest: Effort normal and breath sounds normal. No stridor. No respiratory distress. She has no wheezes.  Abdominal: Soft. Bowel sounds are normal. She exhibits no distension and no mass. There is no tenderness.  Musculoskeletal: She exhibits no edema.       Right shoulder: She exhibits decreased range of motion and tenderness.  She exhibits no swelling, no effusion and no crepitus.  Skin: No rash noted.  Skin over her right knee thoroughly evaluated, no lesion or rash seen. Neg rash on other part of her LL  Nursing note and vitals reviewed.      Assessment:     Right arm pain Skin rash    Plan:     Check problem list.

## 2018-06-05 NOTE — Patient Instructions (Signed)
Shoulder Pain Many things can cause shoulder pain, including:  An injury.  Moving the arm in the same way again and again (overuse).  Joint pain (arthritis).  Follow these instructions at home: Take these actions to help with your pain:  Squeeze a soft ball or a foam pad as much as you can. This helps to prevent swelling. It also makes the arm stronger.  Take over-the-counter and prescription medicines only as told by your doctor.  If told, put ice on the area: ? Put ice in a plastic bag. ? Place a towel between your skin and the bag. ? Leave the ice on for 20 minutes, 2-3 times per day. Stop putting on ice if it does not help with the pain.  If you were given a shoulder sling or immobilizer: ? Wear it as told. ? Remove it to shower or bathe. ? Move your arm as little as possible. ? Keep your hand moving. This helps prevent swelling.  Contact a doctor if:  Your pain gets worse.  Medicine does not help your pain.  You have new pain in your arm, hand, or fingers. Get help right away if:  Your arm, hand, or fingers: ? Tingle. ? Are numb. ? Are swollen. ? Are painful. ? Turn white or blue. This information is not intended to replace advice given to you by your health care provider. Make sure you discuss any questions you have with your health care provider. Document Released: 12/14/2007 Document Revised: 02/21/2016 Document Reviewed: 10/20/2014 Elsevier Interactive Patient Education  2018 Else[employer] Inc.  

## 2018-06-05 NOTE — Assessment & Plan Note (Signed)
No rash observed during today's visit. I asked her husband to look and he stated that he can't see any rash as well and he had told her that there is no rash. It will also be unusual for shingles rash and symptoms to appear only during the night time and disappear during the day. For now, I recommended close monitoring. Return precaution discussed.

## 2018-06-05 NOTE — Assessment & Plan Note (Signed)
Chronic with acute change from baseline. Differentials include arthritis vs overuse. Xray ordered. PT recommended. We will discussed after xray result. I refilled her Voltaren gel. F/U with PCP soon for reassessment.

## 2018-06-06 ENCOUNTER — Telehealth: Payer: Self-pay | Admitting: Family Medicine

## 2018-06-06 DIAGNOSIS — M19011 Primary osteoarthritis, right shoulder: Secondary | ICD-10-CM

## 2018-06-06 NOTE — Telephone Encounter (Signed)
Xray result discussed with her. She want's referral to PT.

## 2018-06-26 ENCOUNTER — Other Ambulatory Visit: Payer: Self-pay

## 2018-06-26 ENCOUNTER — Ambulatory Visit: Payer: Medicare Other | Attending: Family Medicine | Admitting: Physical Therapy

## 2018-06-26 DIAGNOSIS — M25611 Stiffness of right shoulder, not elsewhere classified: Secondary | ICD-10-CM | POA: Insufficient documentation

## 2018-06-26 DIAGNOSIS — M5440 Lumbago with sciatica, unspecified side: Secondary | ICD-10-CM | POA: Diagnosis present

## 2018-06-26 DIAGNOSIS — G8929 Other chronic pain: Secondary | ICD-10-CM | POA: Diagnosis present

## 2018-06-26 DIAGNOSIS — M25511 Pain in right shoulder: Secondary | ICD-10-CM | POA: Insufficient documentation

## 2018-06-26 NOTE — Therapy (Signed)
[place], [place], [address] Phone: [phone]   Fax:  [phone]  Physical Therapy Evaluation  Patient Details  Name: Natalie [name] MRN: [id number] Date of Birth: 02/10/1934 Referring Provider (PT): Gwendlyn Deutscher   Encounter Date: 06/26/2018  PT End of Session - 06/26/18 1433    Visit Number  1    Date for PT Re-Evaluation  08/21/18    PT Start Time  1433    PT Stop Time  1522    PT Time Calculation (min)  49 min    Activity Tolerance  Patient tolerated treatment well    Behavior During Therapy  [facility] for tasks assessed/performed       Past Medical History:  Diagnosis Date  . Back pain    with radiculopathy  . CAD (coronary artery disease)    prior DES Jan 2007 with low risk myoview in 2008  . Colon cancer ([place])    prior adenocarcinoma of cecum in 2000 s/p chemo and colon resection  . GERD (gastroesophageal reflux disease)   . Glaucoma suspect   . HTN (hypertension)   . Hyperlipidemia   . Shingles 06/08/2017  . TIA (transient ischemic attack)   . UTI (urinary tract infection)     Past Surgical History:  Procedure Laterality Date  . ABDOMINAL HYSTERECTOMY    . BACK SURGERY    . CORONARY STENT PLACEMENT  Jan 2007   DES to RCA  . Microdiscectomy     L4-L5    There were no vitals filed for this visit.   Subjective Assessment - 06/26/18 1434    Subjective  Patient reports that she had shingles in Feb 2019 and her left side was affected so she began sleeping on her right side and this caused shoulder pain. It has progressively worsened over the months. She gets pain with reaching and the worst pain is at night. She also complains of pain in the left low back and leg to knee, intermittently to foot with N/T. She also gets muscle cramps. This left side pain occurred with the shingles. Patient has a h/o low back surgery and pain.     Pertinent History  CAD, TIA, shingles, OA, back  surgery, spondylosis L4/5/S1, h/o colon cancer    Diagnostic tests  xrays - OA    Patient Stated Goals  to get rid of pain.    Currently in Pain?  Yes    Pain Score  6    up to 9/10 with reaching   Pain Location  Shoulder    Pain Orientation  Right    Pain Descriptors / Indicators  Dull    Pain Type  Chronic pain    Pain Radiating Towards  post, deltoid and in axilla    Pain Onset  More than a month ago    Pain Frequency  Constant    Aggravating Factors   sleeping on it, reaching    Pain Relieving Factors  CBD ointment (just tried last night)    Effect of Pain on Daily Activities  all painful    Multiple Pain Sites  No         [facility] PT Assessment - 06/26/18 0001      Assessment   Medical Diagnosis  OA Right shoulder    Referring Provider (PT)  Eniola    Onset Date/Surgical Date  10/09/17    Hand Dominance  Right    Prior Therapy  no  Precautions   Precautions  None      Restrictions   Weight Bearing Restrictions  No      Balance Screen   Has the patient fallen in the past 6 months  No    Has the patient had a decrease in activity level because of a fear of falling?   No    Is the patient reluctant to leave their home because of a fear of falling?   No      Home Film/video editor residence    Living Arrangements  Spouse/significant other    Additional Comments  2 story; patient does fine she says      Prior Function   Level of Independence  Independent    Vocation  Retired      Associate Professor   Overall Cognitive Status  Within Functional Limits for tasks assessed      ROM / Strength   AROM / PROM / Strength  AROM;PROM;Strength      AROM   Overall AROM Comments  lumbar WFL    AROM Assessment Site  Shoulder    Right/Left Shoulder  Right    Right Shoulder Extension  --   full   Right Shoulder Flexion  160 Degrees    Right Shoulder ABduction  113 Degrees    Right Shoulder Internal Rotation  60 Degrees    Right Shoulder External  Rotation  45 Degrees      PROM   PROM Assessment Site  Shoulder    Right/Left Shoulder  Right    Right Shoulder Flexion  170 Degrees    Right Shoulder ABduction  120 Degrees    Right Shoulder Internal Rotation  67 Degrees    Right Shoulder External Rotation  50 Degrees      Strength   Overall Strength Comments  bil hip flex 3+/5, ABD/ADD 5/5,seated;     Strength Assessment Site  Shoulder    Right/Left Shoulder  Right    Right Shoulder Flexion  4/5    Right Shoulder Extension  5/5    Right Shoulder ABduction  4/5    Right Shoulder Internal Rotation  5/5    Right Shoulder External Rotation  4-/5      Palpation   Palpation comment  Right infraspinatus, subscapularis and pecs; Left quads, bil gluteals       Special Tests    Special Tests  Rotator Cuff Impingement    Rotator Cuff Impingment tests  Neer impingement test;Hawkins- Kennedy test      Neer Impingement test    Findings  Positive    Side  Right      Hawkins-Kennedy test   Findings  Positive    Side  Right                Objective measurements completed on examination: See above findings.      [facility] Adult PT Treatment/Exercise - 06/26/18 0001      Modalities   Modalities  Electrical Stimulation;Moist Heat      Moist Heat Therapy   Number Minutes Moist Heat  15 Minutes    Moist Heat Location  Shoulder      Electrical Stimulation   Electrical Stimulation Location  right shoulder/lumbar    Electrical Stimulation Action  premod    Electrical Stimulation Parameters  sitting    Electrical Stimulation Goals  Pain             PT Education - 06/26/18 1514  Education Details  sleeping posture; use of pillow under arm    Person(s) Educated  Patient    Methods  Explanation    Comprehension  Verbalized understanding       PT Short Term Goals - 06/26/18 1520      PT SHORT TERM GOAL #1   Title  Ind with inital HEP    Time  2    Period  Weeks    Status  New    Target Date  07/10/18       PT SHORT TERM GOAL #2   Title  decreased pain in right shoulder by 50%     Time  4    Period  Weeks    Status  New    Target Date  07/24/18        PT Long Term Goals - 06/26/18 1521      PT LONG TERM GOAL #1   Title  Patient to report decreased pain in the right shoulder with reaching by 75% or more    Time  8    Period  Weeks    Status  New    Target Date  08/21/18      PT LONG TERM GOAL #2   Title  Patient to demo 4+/5 right shoulder strength to prevent further injury    Time  8    Period  Weeks    Status  New      PT LONG TERM GOAL #3   Title  Patient able to sleep without waking from shoulder pain.    Time  8    Period  Weeks    Status  New      PT LONG TERM GOAL #4   Title  Patient to report decreased pain in low back and leg by 50% or more with ADLs    Time  8    Period  Weeks    Status  New             Plan - 06/26/18 1514    Clinical Impression Statement  Patient presents with c/o of Right shoulder pain due to sleeping on shoulder because left side afffected by shingles in Feb 2019. Her signs and sx are consistent with impingement. She has pain with end range motion and decreased strength due to pain as well. She also c/o low back pain and left hip and leg pain which is unclear whether this is from the shingles or her low back. She has decreased sitting, standing and walking tolerances due to this.    History and Personal Factors relevant to plan of care:  CAD, TIA, shingles, OA, back surgery, spondylosis L4/5/S1, h/o colon cancer    Clinical Presentation  Evolving    Clinical Presentation due to:  changing and worsening sx    Clinical Decision Making  Low    Rehab Potential  Good    PT Frequency  2x / week    PT Duration  8 weeks    PT Treatment/Interventions  ADLs/Self Care Home Management;Cryotherapy;Electrical Stimulation;Iontophoresis 4mg /ml Dexamethasone;Moist Heat;Ultrasound;Therapeutic exercise;Neuromuscular re-education;Patient/family  education;Manual techniques;Dry needling;Taping    PT Next Visit Plan  shoulder strengthening, ROM; assess hip flexibility; modalities for pain. Possibly ionto for shoulder.    Consulted and Agree with Plan of Care  Patient       Patient will benefit from skilled therapeutic intervention in order to improve the following deficits and impairments:  Pain, Postural dysfunction, Decreased activity tolerance, Decreased range of motion, Decreased  strength  Visit Diagnosis: Chronic right shoulder pain - Plan: PT plan of care cert/re-cert  Stiffness of right shoulder, not elsewhere classified - Plan: PT plan of care cert/re-cert  Chronic bilateral low back pain with sciatica, sciatica laterality unspecified - Plan: PT plan of care cert/re-cert     Problem List Patient Active Problem List   Diagnosis Date Noted  . Right arm pain 06/05/2018  . Rash and nonspecific skin eruption 12/08/2017  . History of arterial disease of lower extremity 11/06/2017  . Abnormal ankle brachial index 11/06/2017  . Post herpetic neuralgia 07/13/2017  . Leg pain, lateral, left 05/17/2017  . Arthritis of foot, right, degenerative 12/27/2016  . Health care maintenance 10/20/2015  . Neck and shoulder pain 06/19/2015  . TIA (transient ischemic attack) 05/08/2013  . Spine pain, lumbosacral 07/25/2011  . UTI (urinary tract infection) 07/29/2009  . GERD 07/20/2009  . HERNIATED LUMBAR DISK WITH RADICULOPATHY 05/18/2009  . RADICULOPATHY 05/14/2009  . CAD, NATIVE VESSEL 09/22/2008  . Colon cancer ([place]) 10/18/2007  . Hyperlipidemia 10/18/2007  . Essential hypertension, benign 10/18/2007   Madelyn Flavors PT 06/26/2018, 3:31 PM  [place], [place], [address] Phone: [phone]   Fax:  [phone]  Name: Natalie [name] MRN: [id number] Date of Birth: Mar 31, 1934

## 2018-07-10 ENCOUNTER — Ambulatory Visit: Payer: Medicare Other | Admitting: Physical Therapy

## 2018-07-12 ENCOUNTER — Ambulatory Visit: Payer: Medicare Other | Admitting: Physical Therapy

## 2018-07-26 ENCOUNTER — Ambulatory Visit: Payer: Medicare Other | Attending: Family Medicine | Admitting: Physical Therapy

## 2018-07-26 DIAGNOSIS — M25611 Stiffness of right shoulder, not elsewhere classified: Secondary | ICD-10-CM | POA: Insufficient documentation

## 2018-07-26 DIAGNOSIS — M25511 Pain in right shoulder: Secondary | ICD-10-CM | POA: Diagnosis not present

## 2018-07-26 DIAGNOSIS — G8929 Other chronic pain: Secondary | ICD-10-CM | POA: Diagnosis present

## 2018-07-26 NOTE — Therapy (Signed)
[place], [place], [address] Phone: [phone]   Fax:  [phone]  Physical Therapy Treatment  Patient Details  Name: Natalie [name] MRN: [id number] Date of Birth: 07-May-1934 Referring Provider (PT): Gwendlyn Deutscher   Encounter Date: 07/26/2018  PT End of Session - 07/26/18 1106    Visit Number  2    Date for PT Re-Evaluation  08/21/18    PT Start Time  1102    PT Stop Time  1200    PT Time Calculation (min)  58 min    Activity Tolerance  Patient tolerated treatment well    Behavior During Therapy  [facility] for tasks assessed/performed       Past Medical History:  Diagnosis Date  . Back pain    with radiculopathy  . CAD (coronary artery disease)    prior DES Jan 2007 with low risk myoview in 2008  . Colon cancer ([place])    prior adenocarcinoma of cecum in 2000 s/p chemo and colon resection  . GERD (gastroesophageal reflux disease)   . Glaucoma suspect   . HTN (hypertension)   . Hyperlipidemia   . Shingles 06/08/2017  . TIA (transient ischemic attack)   . UTI (urinary tract infection)     Past Surgical History:  Procedure Laterality Date  . ABDOMINAL HYSTERECTOMY    . BACK SURGERY    . CORONARY STENT PLACEMENT  Jan 2007   DES to RCA  . Microdiscectomy     L4-L5    There were no vitals filed for this visit.  Subjective Assessment - 07/26/18 1106    Subjective  Patient reports that her right shoulder is better but still hurts with movement. Her low back is "killing" her today 10/10 pain. It's always worse in the morning and after she takes meds it gets better as the day goes on.    Pertinent History  CAD, TIA, shingles, OA, back surgery, spondylosis L4/5/S1, h/o colon cancer    Diagnostic tests  xrays - OA    Patient Stated Goals  to get rid of pain.    Currently in Pain?  Yes    Pain Score  1     Pain Location  Shoulder    Pain Orientation  Right    Pain Descriptors /  Indicators  Dull    Pain Type  Chronic pain    Pain Score  10    Pain Location  Back    Pain Orientation  Left;Lower    Pain Descriptors / Indicators  Dull    Pain Type  Chronic pain    Pain Radiating Towards  bil post thighs    Pain Onset  More than a month ago    Pain Frequency  Constant    Aggravating Factors   unknown    Pain Relieving Factors  back brace used to stand up straight, massage chair    Effect of Pain on Daily Activities  difficult to stand up without brace and has to crawl upstairs sometimes         [facility] PT Assessment - 07/26/18 0001      Strength   Strength Assessment Site  Shoulder    Right/Left Shoulder  Right    Right Shoulder Flexion  4+/5    Right Shoulder Extension  5/5    Right Shoulder ABduction  4/5    Right Shoulder Internal Rotation  5/5    Right Shoulder External Rotation  4+/5  [facility] Adult PT Treatment/Exercise - 07/26/18 0001      Exercises   Exercises  Shoulder;Lumbar      Lumbar Exercises: Stretches   Figure 4 Stretch  2 reps;30 seconds;With overpressure      Shoulder Exercises: Standing   Protraction  Strengthening;Right;15 reps;Theraband    Theraband Level (Shoulder Protraction)  Level 1 (Yellow)    Horizontal ABduction  Strengthening;Both;15 reps;Theraband    Theraband Level (Shoulder Horizontal ABduction)  Level 1 (Yellow)    External Rotation  Strengthening;Right;5 reps;Theraband;Limitations    Theraband Level (Shoulder External Rotation)  Level 1 (Yellow)    External Rotation Limitations  painful    Internal Rotation  Strengthening;Right;15 reps;Theraband    Theraband Level (Shoulder Internal Rotation)  Level 1 (Yellow)    Extension  Strengthening;Both;15 reps;Theraband    Theraband Level (Shoulder Extension)  Level 1 (Yellow)    Row  Strengthening;15 reps;Theraband    Theraband Level (Shoulder Row)  Level 1 (Yellow)      Shoulder Exercises: ROM/Strengthening   UBE (Upper Arm Bike)  L2 x 6 min       Modalities   Modalities  Electrical Stimulation;Iontophoresis      Moist Heat Therapy   Number Minutes Moist Heat  15 Minutes    Moist Heat Location  Lumbar Spine      Electrical Stimulation   Electrical Stimulation Location  lumbar/gluteals    Electrical Stimulation Action  IFC    Electrical Stimulation Parameters  Supine    Electrical Stimulation Goals  Pain      Iontophoresis   Type of Iontophoresis  Dexamethasone    Location  right shoulder    Dose  43mmin    Time  4 hour patch             PT Education - 07/26/18 1204    Education Details  HEP; ionto education; tens info provided; use of ball for MFR to hips    Person(s) Educated  Patient    Methods  Explanation;Demonstration;Verbal cues;Handout    Comprehension  Verbalized understanding;Returned demonstration       PT Short Term Goals - 07/26/18 1212      PT SHORT TERM GOAL #1   Title  Ind with inital HEP    Time  2    Period  Weeks    Status  Achieved      PT SHORT TERM GOAL #2   Title  decreased pain in right shoulder by 50%     Time  4    Period  Weeks    Status  Achieved        PT Long Term Goals - 07/26/18 1212      PT LONG TERM GOAL #1   Title  Patient to report decreased pain in the right shoulder with reaching by 75% or more    Time  8    Period  Weeks    Status  Partially Met      PT LONG TERM GOAL #2   Title  Patient to demo 4+/5 right shoulder strength to prevent further injury    Time  8    Period  Weeks    Status  Partially Met      PT LONG TERM GOAL #3   Title  Patient able to sleep without waking from shoulder pain.    Time  8    Period  Weeks    Status  Partially Met      PT LONG TERM GOAL #4  Title  Patient to report decreased pain in low back and leg by 50% or more with ADLs    Time  8    Period  Weeks    Status  On-going            Plan - 07/26/18 1208    Clinical Impression Statement  Patient presents today after 4 weeks with reports of improved  right shoulder pain, but increased low back pain referring into post thighs. She still has symptoms of right shoulder impingement and tolerated TE fairly well except resisted ER. Hip flexibility is WNL but patient felt stretch with figure 4. She had relief with estim to lumbar/gluts and TENS info was provided. Ionto was administered to post right shoulder.  Patient wants to try HEP and will call to reschedule prn.    Rehab Potential  Good    PT Frequency  2x / week    PT Duration  8 weeks    PT Treatment/Interventions  ADLs/Self Care Home Management;Cryotherapy;Electrical Stimulation;Iontophoresis 37m/ml Dexamethasone;Moist Heat;Ultrasound;Therapeutic exercise;Neuromuscular re-education;Patient/family education;Manual techniques;Dry needling;Taping    PT Next Visit Plan  shoulder strengthening, ROM; assess hip flexibility; modalities for pain.     PT Home Exercise Plan   [email or web address]     Consulted and Agree with Plan of Care  Patient       Patient will benefit from skilled therapeutic intervention in order to improve the following deficits and impairments:  Pain, Postural dysfunction, Decreased activity tolerance, Decreased range of motion, Decreased strength  Visit Diagnosis: Chronic right shoulder pain  Stiffness of right shoulder, not elsewhere classified     Problem List Patient Active Problem List   Diagnosis Date Noted  . Right arm pain 06/05/2018  . Rash and nonspecific skin eruption 12/08/2017  . History of arterial disease of lower extremity 11/06/2017  . Abnormal ankle brachial index 11/06/2017  . Post herpetic neuralgia 07/13/2017  . Leg pain, lateral, left 05/17/2017  . Arthritis of foot, right, degenerative 12/27/2016  . Health care maintenance 10/20/2015  . Neck and shoulder pain 06/19/2015  . TIA (transient ischemic attack) 05/08/2013  . Spine pain, lumbosacral 07/25/2011  . UTI (urinary tract infection) 07/29/2009  . GERD 07/20/2009  . HERNIATED LUMBAR DISK WITH  RADICULOPATHY 05/18/2009  . RADICULOPATHY 05/14/2009  . CAD, NATIVE VESSEL 09/22/2008  . [place] 10/18/2007  . Hyperlipidemia 10/18/2007  . Essential hypertension, benign 10/18/2007    JMadelyn FlavorsPT 07/26/2018, 12:15 PM  [place] [address]Phone: [phone]  Fax:  [phone] Name: SOdelle KosierMRN: [id number] of Birth: [birth year]

## 2018-07-26 NOTE — Patient Instructions (Signed)
Access Code: [email or web address]  URL: [email or web address]  Date: 07/26/2018  Prepared by: Almyra Free Annlouise Gerety   Exercises  Scapular Retraction with Resistance - 10 reps - 2 sets - 1x daily - 7x weekly  Scapular Retraction with Resistance Advanced - 10 reps - 2 sets - 1x daily - 7x weekly  Shoulder External Rotation and Scapular Retraction - 10 reps - 2 sets - 1x daily - 7x weekly  Shoulder Internal Rotation with Resistance - 10 reps - 3 sets - 1x daily - 7x weekly  Seated Shoulder Horizontal Abduction with Resistance - 10 reps - 2 sets - 1x daily - 7x weekly  Seated Figure 4 Piriformis Stretch - 3 reps - 1 sets - 30 seconds hold - 2x daily - 7x weekly  Patient Education  TENS Unit    IONTOPHORESIS PATIENT PRECAUTIONS & CONTRAINDICATIONS:  . Redness under one or both electrodes can occur.  This characterized by a uniform redness that usually disappears within 12 hours of treatment. . Small pinhead size blisters may result in response to the drug.  Contact your physician if the problem persists more than 24 hours. . On rare occasions, iontophoresis therapy can result in temporary skin reactions such as rash, inflammation, irritation or burns.  The skin reactions may be the result of individual sensitivity to the ionic solution used, the condition of the skin at the start of treatment, reaction to the materials in the electrodes, allergies or sensitivity to dexamethasone, or a poor connection between the patch and your skin.  Discontinue using iontophoresis if you have any of these reactions and report to your therapist. . Remove the Patch or electrodes if you have any undue sensation of pain or burning during the treatment and report discomfort to your therapist. . Tell your Therapist if you have had known adverse reactions to the application of electrical current. . If using the Patch, the LED light will turn off when treatment is complete and the patch can be removed.  Approximate treatment  time is 1-3 hours.  Remove the patch when light goes off or after 6 hours. . The Patch can be worn during normal activity, however excessive motion where the electrodes have been placed can cause poor contact between the skin and the electrode or uneven electrical current resulting in greater risk of skin irritation. Marland Kitchen Keep out of the reach of children.   . DO NOT use if you have a cardiac pacemaker or any other electrically sensitive implanted device. . DO NOT use if you have a known sensitivity to dexamethasone. . DO NOT use during Magnetic Resonance Imaging (MRI). . DO NOT use over broken or compromised skin (e.g. sunburn, cuts, or acne) due to the increased risk of skin reaction. . DO NOT SHAVE over the area to be treated:  To establish good contact between the Patch and the skin, excessive hair may be clipped. . DO NOT place the Patch or electrodes on or over your eyes, directly over your heart, or brain. . DO NOT reuse the Patch or electrodes as this may cause burns to occur.

## 2018-09-10 ENCOUNTER — Other Ambulatory Visit: Payer: Self-pay | Admitting: Cardiology

## 2018-09-10 DIAGNOSIS — R6889 Other general symptoms and signs: Secondary | ICD-10-CM

## 2018-09-10 DIAGNOSIS — Z8679 Personal history of other diseases of the circulatory system: Secondary | ICD-10-CM

## 2018-11-06 ENCOUNTER — Inpatient Hospital Stay (HOSPITAL_COMMUNITY): Admission: RE | Admit: 2018-11-06 | Payer: Medicare Other | Source: Ambulatory Visit

## 2018-12-12 ENCOUNTER — Ambulatory Visit (HOSPITAL_COMMUNITY)
Admission: RE | Admit: 2018-12-12 | Discharge: 2018-12-12 | Disposition: A | Discharge status: Home / Self Care | Payer: Medicare Other | Source: Ambulatory Visit | Attending: Cardiology | Admitting: Cardiology

## 2018-12-12 ENCOUNTER — Encounter (HOSPITAL_COMMUNITY): Payer: Self-pay

## 2018-12-12 ENCOUNTER — Other Ambulatory Visit: Payer: Self-pay

## 2018-12-12 ENCOUNTER — Telehealth: Payer: Self-pay | Admitting: Cardiology

## 2018-12-12 DIAGNOSIS — R6889 Other general symptoms and signs: Secondary | ICD-10-CM

## 2018-12-12 DIAGNOSIS — Z8679 Personal history of other diseases of the circulatory system: Secondary | ICD-10-CM

## 2018-12-12 NOTE — Telephone Encounter (Signed)
Will send this message to Dr Meda Coffee as a general FYI, on pts decision to not proceed with ordered PV testing.  Will follow-up with the pt as needed, if further recommendations endorsed.

## 2018-12-12 NOTE — Telephone Encounter (Signed)
Patient arrived at [facility] for lower extremity arterial study.  She stated she did not want to have the test done because it will not help the pain she is having in her legs.  She feels the pain is still coming from the "bout" she had with the shingles.

## 2018-12-15 ENCOUNTER — Other Ambulatory Visit: Payer: Self-pay | Admitting: Cardiology

## 2018-12-24 ENCOUNTER — Other Ambulatory Visit: Payer: Self-pay | Admitting: Cardiology

## 2018-12-27 ENCOUNTER — Other Ambulatory Visit: Payer: Self-pay | Admitting: Cardiology

## 2019-01-23 ENCOUNTER — Telehealth (INDEPENDENT_AMBULATORY_CARE_PROVIDER_SITE_OTHER): Payer: Medicare Other | Admitting: Family Medicine

## 2019-01-23 ENCOUNTER — Other Ambulatory Visit: Payer: Self-pay

## 2019-01-23 DIAGNOSIS — N3001 Acute cystitis with hematuria: Secondary | ICD-10-CM | POA: Diagnosis not present

## 2019-01-23 MED ORDER — NITROFURANTOIN MONOHYD MACRO 100 MG PO CAPS: 100.0000 mg | ORAL_CAPSULE | Freq: Two times a day (BID) | ORAL | 0 refills | Status: AC

## 2019-01-23 NOTE — Assessment & Plan Note (Signed)
Patient with UTI symptoms, frequency, burning. Unable to come to clinic for UA due to lack of transportation. -Treat empirically with Macrobid 100mg  BID x5 days -Patient instructed to take the full course of the antibiotic even if she is feeling better. -She was encouraged to contact clinic or go to ED should she develop fever, malaise, shortness of breath, abdominal pain, back pains, etc.

## 2019-01-23 NOTE — Progress Notes (Signed)
[place] Telemedicine Visit  Patient consented to have virtual visit. Method of visit: Telephone  Encounter participants: Patient: Natalie [name] - located at home Provider: Daisy Floro - located at clinic Others (if applicable): daughter  Chief Complaint: UTI   HPI: Patient has had "UTI symptoms" since 2 weeks. Patient feeling burning with urination as well as urinary odor. She is also waking up sweaty and having to go to the bathroom. Patient is having urinary frequency and urgency. She took her temperature this morning and it was 97.1*F. Denies headaches, fevers, nausea, back pain, shortness of breath, vomiting, diarrhea and malaise.  ROS: per HPI  Pertinent PMHx: HTN,   Exam:  Respiratory: normal work of breathing, speaking full sentences  Assessment/Plan: UTI (urinary tract infection) Patient with UTI symptoms, frequency, burning. Unable to come to clinic for UA due to lack of transportation. -Treat empirically with Macrobid 100mg  BID x5 days -Patient instructed to take the full course of the antibiotic even if she is feeling better. -She was encouraged to contact clinic or go to ED should she develop fever, malaise, shortness of breath, abdominal pain, back pains, etc.  Time spent during visit with patient: 19:02 minutes  Milus Banister, [place], PGY-2 01/23/2019 9:06 AM

## 2019-02-01 ENCOUNTER — Other Ambulatory Visit: Payer: Self-pay

## 2019-02-01 ENCOUNTER — Ambulatory Visit (INDEPENDENT_AMBULATORY_CARE_PROVIDER_SITE_OTHER): Payer: Medicare Other | Admitting: Family Medicine

## 2019-02-01 ENCOUNTER — Encounter: Payer: Self-pay | Admitting: Family Medicine

## 2019-02-01 VITALS — BP 130/80 | HR 74

## 2019-02-01 DIAGNOSIS — R300 Dysuria: Secondary | ICD-10-CM

## 2019-02-01 DIAGNOSIS — N3001 Acute cystitis with hematuria: Secondary | ICD-10-CM | POA: Diagnosis not present

## 2019-02-01 LAB — POCT URINALYSIS DIP (MANUAL ENTRY)
Bilirubin, UA: NEGATIVE
Glucose, UA: NEGATIVE mg/dL
Nitrite, UA: POSITIVE — AB
Protein Ur, POC: 100 mg/dL — AB
Spec Grav, UA: 1.025 (ref 1.010–1.025)
Urobilinogen, UA: 0.2 E.U./dL
pH, UA: 5.5 (ref 5.0–8.0)

## 2019-02-01 MED ORDER — SULFAMETHOXAZOLE-TRIMETHOPRIM 800-160 MG PO TABS: 1.0000 | ORAL_TABLET | Freq: Two times a day (BID) | ORAL | 0 refills | Status: AC

## 2019-02-01 NOTE — Progress Notes (Signed)
  Established Patient - Acute Visit Subjective  Subjective  Patient ID: MRN [id number]  Date of birth: 02-Jul-1934   PCP: Natalie Del, MD  CC: Dysuria  HPI: Natalie [name] is a 85 y.o. female with past medical history significant for UTI diagnosed through tele-visit on 01/23/19 who presents today with worsening and continued dysuria. Patient complains of burning with urination, dysuria, frequency and urgency She has had symptoms for 10 days Patient denies back pain, congestion, cough, fever, headache, rhinitis, sorethroat and stomach ache. Patient does not have a history of recurrent UTI, about 1-2x yearly. Has had a recent UTI earlier this year, per patient.   Patient does not have a history of pyelonephritis.   HISTORY Medications, allergies, medical history, family history and social history were reviewed and edited as necessary. Pertinent findings included in HPI.  Social Hx: Natalie [name] reports that she quit smoking about 25 years ago. Her smoking use included cigarettes. She started smoking about 63 years ago. She has a 19.00 pack-year smoking history. She has never used smokeless tobacco. She reports that she does not drink alcohol or use drugs. ROS: See HPI    Objective   Objective  Physical Exam:  BP 130/80   Pulse 74   SpO2 94%  General: Thin, African-American woman.  Appears uncomfortable. Abdomen: Nondistended, nontender.  No suprapubic tenderness.  Positive bowel sounds Back: No CTA tenderness GU: Deferred  Pertinent Labs & Imaging:  UA small bilirubin, cloudy appearance, large leukocytes, positive nitrites, 100 protein.   Assessment  Assessment & Plan  UTI (urinary tract infection) Patient returns with UTI symptoms.  Urinalysis is consistent with UTI with positive nitrites, large leukocytes.  Was unable to collect enough of the specimen for micro analysis.  Will send remainder of specimen for culture as patient reports frequent UTIs in the past.  Previous UTIs  have shown Klebsiella resistant to ampicillin and intermediate resistance to Macrobid.  Patient denies fever, chills, nausea, vomiting.Pt also with hx of penicillin allergy. Today, will treat with bactrim according to [facility]'s antibiogram.   Orders Placed This Encounter  Procedures  . Urine Culture  . POCT urinalysis dipstick     Natalie [name], M.D.  PGY-2  Family Medicine  [phone] 02/01/2019 2:36 PM

## 2019-02-01 NOTE — Patient Instructions (Addendum)
Dear Natalie [name],   It was good to see you! Thank you for taking your time to come in to be seen. Today, we discussed the following:   Painful urination  I reviewed previous UTI information in your chart and it appears that you have some resistance to a few different antibiotics.  I am sending you with Bactrim to take 2 times a day for 3 days.  If you continue to have symptoms, please call the clinic back and leave a message for me.  I will let you know if your cultures come back and you require a different medication.  To help with pain, you can get Azo over-the-counter.  This medicine can change the color of your urine to a bright orange, but is completely normal.    Be well,   Zettie Cooley, M.D   [place] [phone]  *Sign up for MyChart for instant access to your health profile, labs, orders, upcoming appointments or to contact your provider with questions*  =================================================================================== Some important items for Natalie [name]'s health:    Your Blood Pressure.  Should be LESS THAN 140/90 or 150/90 if you are over 65 BP Readings from Last 3 Encounters:  02/01/19 130/80  06/05/18 110/60  04/03/18 (!) 156/78    Your Weight History Wt Readings from Last 3 Encounters:  06/05/18 116 lb (52.6 kg)  04/03/18 117 lb 9.6 oz (53.3 kg)  01/26/18 119 lb 1.9 oz (54 kg)    There is no height or weight on file to calculate BMI.  BMI Classes Classification BMI Category (kg/m2)  Underweight < 18.5  Normal Weight 18.5-24.9  Overweight  25.0-29.9  Obese Class I 30.0-34.9  Obese Class II 35.0-39.9  Obese Class III  > or  = 40.0      Your last A1C     Every 3-6 months if you have diabetes Lab Results  Component Value Date   HGBA1C 5.6 06/14/2016    Your last Cholesterol   Every 1-5 years    Component Value Date/Time   CHOL 235 (H) 02/13/2018 1447   HDL 62 02/13/2018 1447   LDLCALC 134 (H) 02/13/2018  1447   LDLDIRECT 143.7 07/30/2007 0950    Your last Blood Tests -  Once a year if you take medications    Component Value Date/Time   K 4.2 11/27/2017 1642   CREATININE 0.77 11/27/2017 1642   CREATININE 0.79 05/26/2016 1011   GLUCOSE 86 11/27/2017 1642   GLUCOSE 80 11/17/2016 1836    To Keep You Healthy Your are due for the following Health Maintenance Items:  Health Maintenance Due  Topic Date Due  . TETANUS/TDAP  08/29/1954  . DEXA SCAN  08/29/2000    Please schedule an appointment with your healthcare provider for any questions or concerns regarding your health or any of the items above.   Urinary Tract Infection, Adult  A urinary tract infection (UTI) is an infection of any part of the urinary tract. The urinary tract includes the kidneys, ureters, bladder, and urethra. These organs make, store, and get rid of urine in the body. Your health care provider may use other names to describe the infection. An upper UTI affects the ureters and kidneys (pyelonephritis). A lower UTI affects the bladder (cystitis) and urethra (urethritis). What are the causes? Most urinary tract infections are caused by bacteria in your genital area, around the entrance to your urinary tract (urethra). These bacteria grow and cause inflammation of your urinary tract. What  increases the risk? You are more likely to develop this condition if:  You have a urinary catheter that stays in place (indwelling).  You are not able to control when you urinate or have a bowel movement (you have incontinence).  You are female and you: ? Use a spermicide or diaphragm for birth control. ? Have low estrogen levels. ? Are pregnant.  You have certain genes that increase your risk (genetics).  You are sexually active.  You take antibiotic medicines.  You have a condition that causes your flow of urine to slow down, such as: ? An enlarged prostate, if you are female. ? Blockage in your urethra (stricture). ? A  kidney stone. ? A nerve condition that affects your bladder control (neurogenic bladder). ? Not getting enough to drink, or not urinating often.  You have certain medical conditions, such as: ? Diabetes. ? A weak disease-fighting system (immunesystem). ? Sickle cell disease. ? Gout. ? Spinal cord injury. What are the signs or symptoms? Symptoms of this condition include:  Needing to urinate right away (urgently).  Frequent urination or passing small amounts of urine frequently.  Pain or burning with urination.  Blood in the urine.  Urine that smells bad or unusual.  Trouble urinating.  Cloudy urine.  Vaginal discharge, if you are female.  Pain in the abdomen or the lower back. You may also have:  Vomiting or a decreased appetite.  Confusion.  Irritability or tiredness.  A fever.  Diarrhea. The first symptom in older adults may be confusion. In some cases, they may not have any symptoms until the infection has worsened. How is this diagnosed? This condition is diagnosed based on your medical history and a physical exam. You may also have other tests, including:  Urine tests.  Blood tests.  Tests for sexually transmitted infections (STIs). If you have had more than one UTI, a cystoscopy or imaging studies may be done to determine the cause of the infections. How is this treated? Treatment for this condition includes:  Antibiotic medicine.  Over-the-counter medicines to treat discomfort.  Drinking enough water to stay hydrated. If you have frequent infections or have other conditions such as a kidney stone, you may need to see a health care provider who specializes in the urinary tract (urologist). In rare cases, urinary tract infections can cause sepsis. Sepsis is a life-threatening condition that occurs when the body responds to an infection. Sepsis is treated in the hospital with IV antibiotics, fluids, and other medicines. Follow these instructions at  home:  Medicines  Take over-the-counter and prescription medicines only as told by your health care provider.  If you were prescribed an antibiotic medicine, take it as told by your health care provider. Do not stop using the antibiotic even if you start to feel better. General instructions  Make sure you: ? Empty your bladder often and completely. Do not hold urine for long periods of time. ? Empty your bladder after sex. ? Wipe from front to back after a bowel movement if you are female. Use each tissue one time when you wipe.  Drink enough fluid to keep your urine pale yellow.  Keep all follow-up visits as told by your health care provider. This is important. Contact a health care provider if:  Your symptoms do not get better after 1-2 days.  Your symptoms go away and then return. Get help right away if you have:  Severe pain in your back or your lower abdomen.  A fever.  Nausea or vomiting. Summary  A urinary tract infection (UTI) is an infection of any part of the urinary tract, which includes the kidneys, ureters, bladder, and urethra.  Most urinary tract infections are caused by bacteria in your genital area, around the entrance to your urinary tract (urethra).  Treatment for this condition often includes antibiotic medicines.  If you were prescribed an antibiotic medicine, take it as told by your health care provider. Do not stop using the antibiotic even if you start to feel better.  Keep all follow-up visits as told by your health care provider. This is important. This information is not intended to replace advice given to you by your health care provider. Make sure you discuss any questions you have with your health care provider. Document Released: 04/06/2005 Document Revised: 06/14/2018 Document Reviewed: 01/04/2018 Elsevier Patient Education  2020 [employer].

## 2019-02-01 NOTE — Progress Notes (Signed)
Continued to have symptoms despite taking macrobid x 5 days. Symptoms acutely worsened last night.   Burning, frequency,   Not sexually active. No fevers.   No abnormal discharge.   Pee is clear.

## 2019-02-01 NOTE — Assessment & Plan Note (Addendum)
Patient returns with UTI symptoms.  Urinalysis is consistent with UTI with positive nitrites, large leukocytes.  Was unable to collect enough of the specimen for micro analysis.  Will send remainder of specimen for culture as patient reports frequent UTIs in the past.  Previous UTIs have shown Klebsiella resistant to ampicillin and intermediate resistance to Macrobid.  Patient denies fever, chills, nausea, vomiting.Pt also with hx of penicillin allergy. Today, will treat with bactrim according to [facility]'s antibiogram.

## 2019-02-03 LAB — URINE CULTURE

## 2019-03-16 ENCOUNTER — Other Ambulatory Visit: Payer: Self-pay | Admitting: Cardiology

## 2019-03-25 ENCOUNTER — Ambulatory Visit (INDEPENDENT_AMBULATORY_CARE_PROVIDER_SITE_OTHER): Payer: Medicare Other | Admitting: Family Medicine

## 2019-03-25 ENCOUNTER — Other Ambulatory Visit: Payer: Self-pay

## 2019-03-25 DIAGNOSIS — W57XXXA Bitten or stung by nonvenomous insect and other nonvenomous arthropods, initial encounter: Secondary | ICD-10-CM | POA: Insufficient documentation

## 2019-03-25 DIAGNOSIS — B0229 Other postherpetic nervous system involvement: Secondary | ICD-10-CM

## 2019-03-25 MED ORDER — SHINGRIX 50 MCG/0.5ML IM SUSR: 0.5000 mL | Freq: Once | INTRAMUSCULAR | 0 refills | Status: AC

## 2019-03-25 MED ORDER — LIDOCAINE 5 % EX OINT: 1.0000 "application " | TOPICAL_OINTMENT | CUTANEOUS | 0 refills | Status: AC | PRN

## 2019-03-25 NOTE — Patient Instructions (Addendum)
I would like you to continue putting on the triamcinolone cream once a day for the next few days to week as long as you are still feeling itchy.  I have also prescribed a topical pain relieving gel called xylocaine which you can apply throughout the day when you feel like you need to scratch that area.  This will help speed up the healing process.  This is not shingles.  I [facility] for a [facility]ingles vaccine.  You can call them ahead of time to see how much it would cost before you decide to get it.    Have a great day,   Clemetine Marker, MD

## 2019-03-25 NOTE — Assessment & Plan Note (Signed)
No concern for lyme based on exam.  Most likely a mosquito bite, but could have been a spider or tick.  There is no erythema on exam.   - continue triamcinolone once a day for the next week or so until itching resolves - apply lidocaine cream during the day when pruritus begins to bother her.

## 2019-03-25 NOTE — Assessment & Plan Note (Signed)
Patient continues to have pain down her left leg since getting shingles.  She cannot tolerate gabapentin.  It appears she took amitripytline but stopped taking it after her refills ran out.  Patient should discuss with pcp regarding treatment options for post herpetic neuralgia given her poor tolerance of medications.   - [facility]  Patient will check price before getting it.

## 2019-03-25 NOTE — Progress Notes (Signed)
   [facility] Phone: [phone]     Natalie [name] - 85 y.o. female MRN [id number]  Date of birth: 1934/01/01  Subjective:   cc: itchy bump  HPI:  Bite/pruritus: The patient was working in her garden last week and states the mosquitoes were pretty bad outside.  She does not remember getting bit, but when she went back inside afterwards, she noticed that she was itching on her left side.  She also noticed a small bite or bump and then a small red ring around it.  She continued to have itching on that area.  She is very concerned that it could be shingles given that she had shingles on her left leg in February 2019 that she is still experiencing pain from.  She does not take gabapentin because it makes her nauseous, and she does not take the amitriptyline anymore.  She has been putting antibiotic ointment on it as well as triamcinolone cream which she had from her shingles.  She thinks the triamcinolone has helped it.  She has noticed that is been improving over the last few days.  She has not had a shingles vaccine yet.  ROS: See HPI for pertinent positives and negatives  Family history reviewed for today's visit. No changes.  Social history- patient is a non smoker  Objective:   BP (!) 116/54   Pulse 73   Wt 116 lb 12.8 oz (53 kg)   SpO2 96%   BMI 19.44 kg/m  Gen: NAD, alert and oriented, cooperative with exam Skin: small bump, without erythema of surrounding area, 1-49mm in diamter on left mid axillary region.   Psych: Appropriate behavior  Assessment/Plan:   Post herpetic neuralgia Patient continues to have pain down her left leg since getting shingles.  She cannot tolerate gabapentin.  It appears she took amitripytline but stopped taking it after her refills ran out.  Patient should discuss with pcp regarding treatment options for post herpetic neuralgia given her poor tolerance of medications.   [facility]  Patient will check  price before getting it.    Bug bite No concern for lyme based on exam.  Most likely a mosquito bite, but could have been a spider or tick.  There is no erythema on exam.   - continue triamcinolone once a day for the next week or so until itching resolves - apply lidocaine cream during the day when pruritus begins to bother her.    Clemetine Marker, MD PGY-2 [facility]

## 2019-03-26 IMAGING — MR MR HEAD W/O CM
9 of 10 series · 36 of 48 positions shown · non-contrast
Comparison: 11/17/2016 CT of the head. 05/08/2013 MRI and MRA of
the head.

CLINICAL DATA: 83 y/o  F; dizziness with numbness in the left hand.

EXAM:
MRI HEAD WITHOUT CONTRAST
TECHNIQUE: Multiplanar, multiecho pulse sequences of the brain and surrounding
structures were obtained without intravenous contrast.

[Series 3: T1 · sagittal · 5.0mm · 0.47mm/px · 3 of 23 slices shown]
[im 1/23]
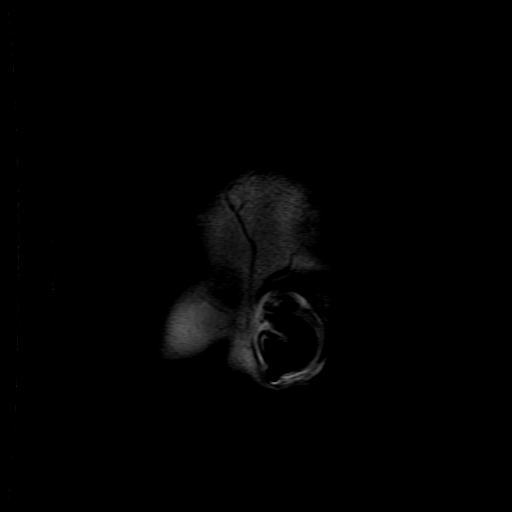
[im 12/23]
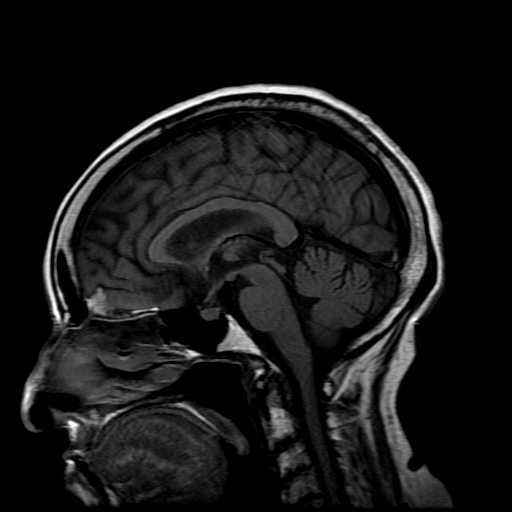
[im 23/23]
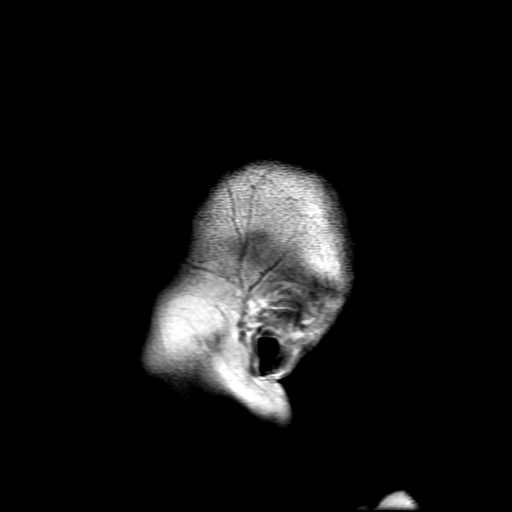

[Series 4: DWI · axial · 3.0mm · 1.09mm/px · z∈[-71,+56]mm · 8 of 88 slices shown (1 of 4)]
[im 1/88]
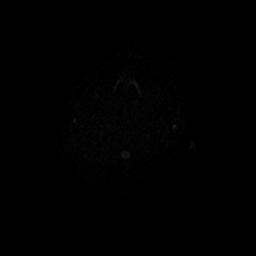
[im 10/88]
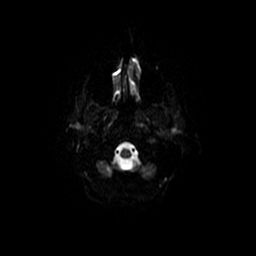
[im 30/88]
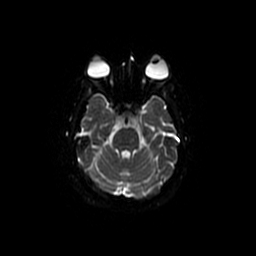
[im 39/88]
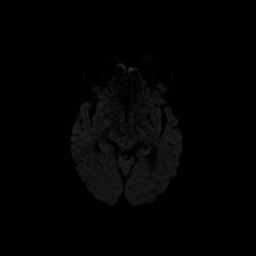
[im 49/88]
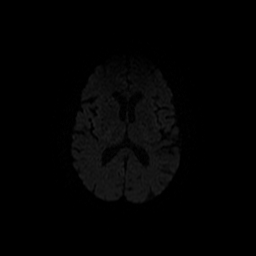
[im 59/88]
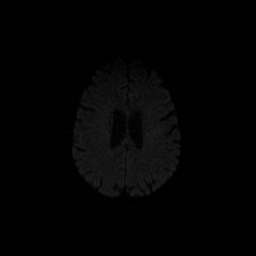
[im 78/88]
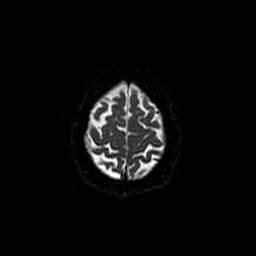
[im 88/88]
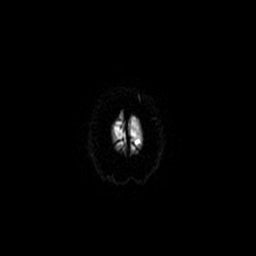

[Series 5: T2 · axial · 5.0mm · 0.43mm/px · z∈[-64,+60]mm · 2 of 22 slices shown (1 of 2)]
[im 1/22]
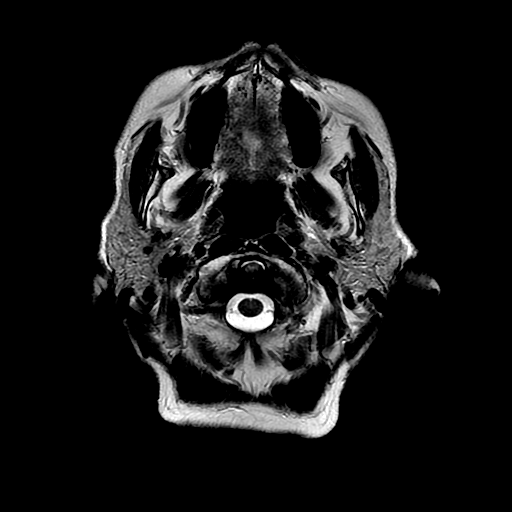
[im 22/22]
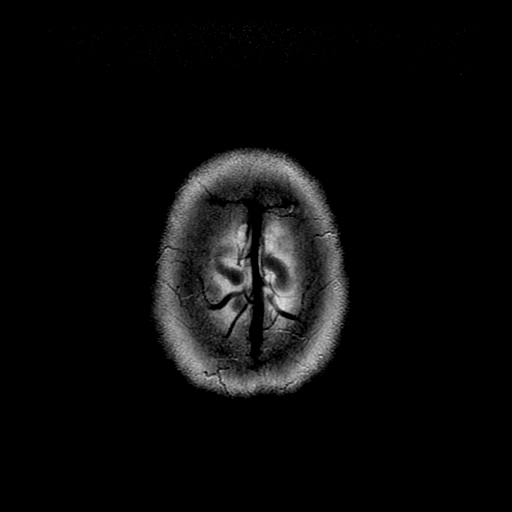

[Series 6: FLAIR · axial · 5.0mm · 0.43mm/px · z∈[-64,+60]mm · 2 of 22 slices shown]
[im 1/22]
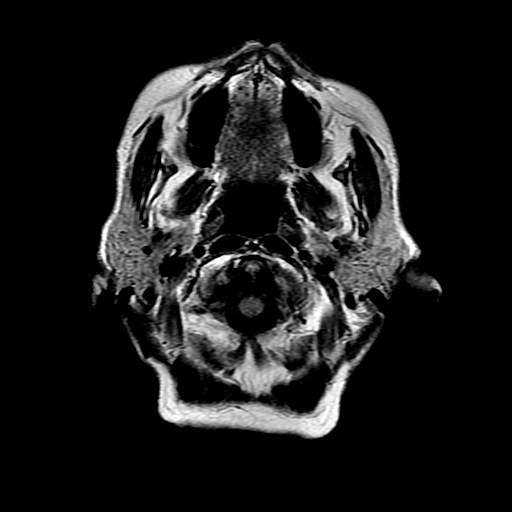
[im 22/22]
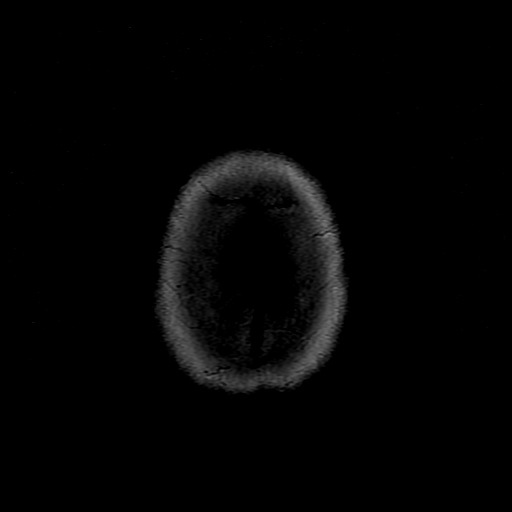

[Series 7: DWI · coronal · 5.0mm · 1.09mm/px · 7 of 68 slices shown (2 of 4)]
[im 1/68]
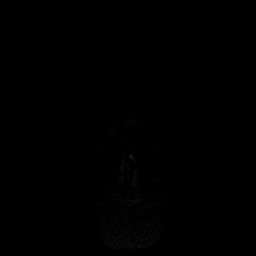
[im 12/68]
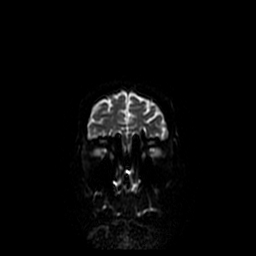
[im 23/68]
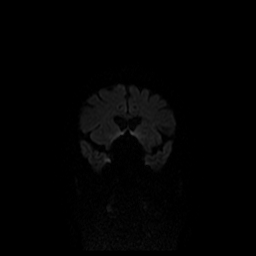
[im 34/68]
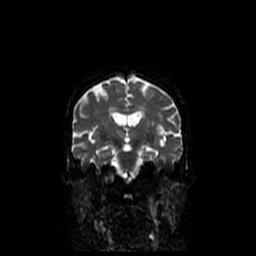
[im 45/68]
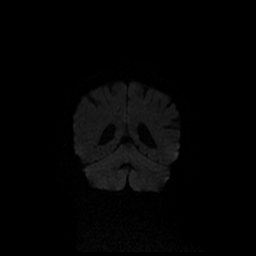
[im 56/68]
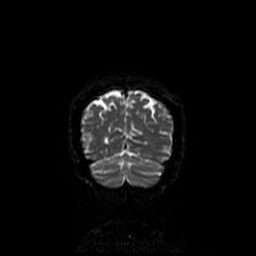
[im 68/68]
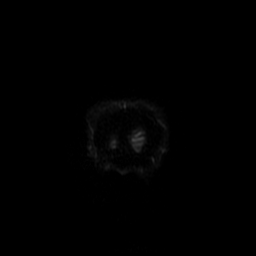

[Series 8: ax mpgr · axial · 5.0mm · 0.43mm/px · z∈[-64,+60]mm · 2 of 22 slices shown]
[im 1/22]
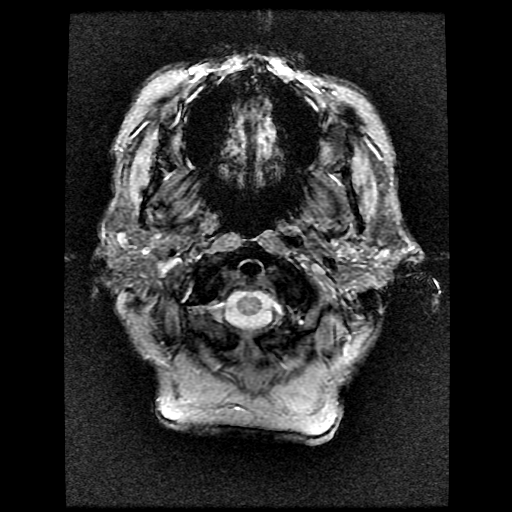
[im 22/22]
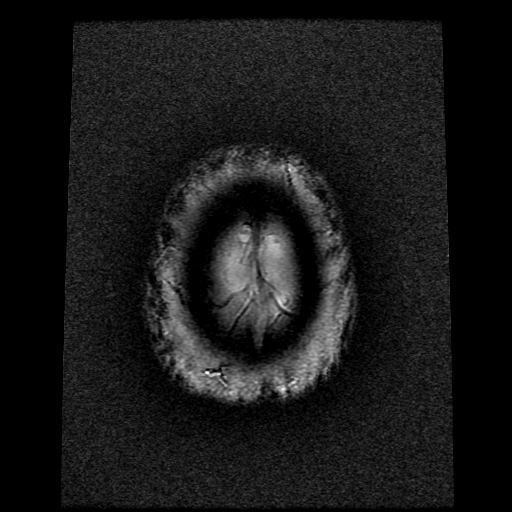

[Series 10: T2 · coronal · 5.0mm · 0.39mm/px · 3 of 27 slices shown (2 of 2)]
[im 1/27]
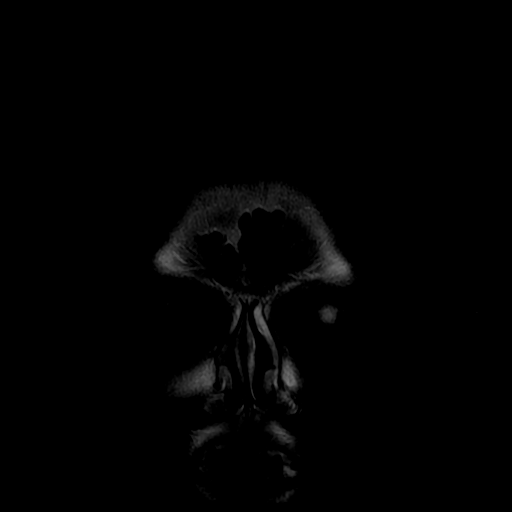
[im 14/27]
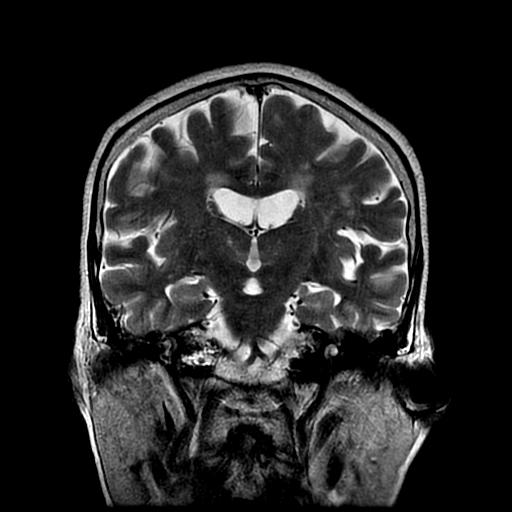
[im 27/27]
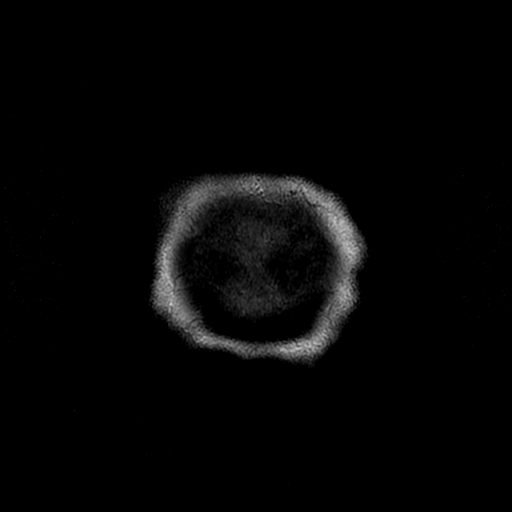

[Series 400: DWI · axial · 3.0mm · 1.09mm/px · z∈[-71,+56]mm · 5 of 44 slices shown (3 of 4)]
[im 1/44]
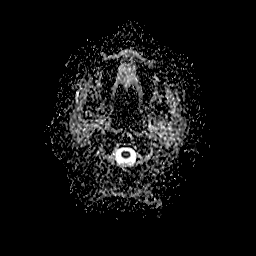
[im 11/44]
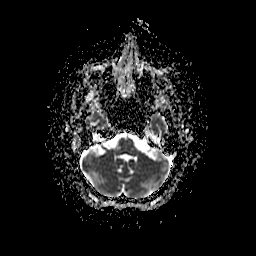
[im 22/44]
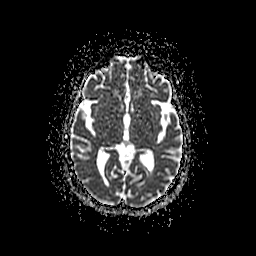
[im 33/44]
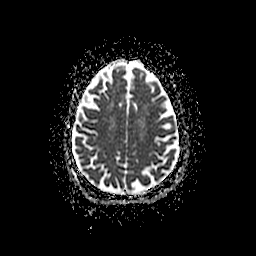
[im 44/44]
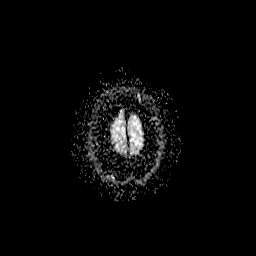

[Series 700: DWI · coronal · 5.0mm · 1.09mm/px · 4 of 34 slices shown (4 of 4)]
[im 1/34]
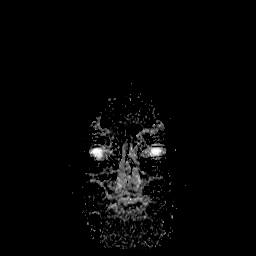
[im 12/34]
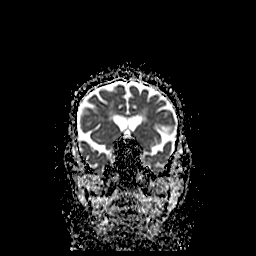
[im 23/34]
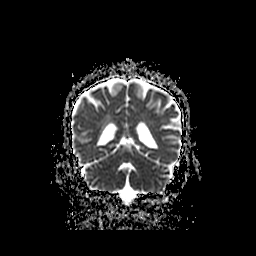
[im 34/34]
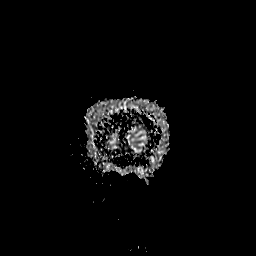

[36 of 48 positions shown; findings below may reference images not displayed]

FINDINGS: Brain: No acute infarction, hemorrhage, hydrocephalus, extra-axial
collection or mass lesion. Nonspecific foci of T2 FLAIR hyperintense
signal abnormality in subcortical and periventricular white matter
are stable and consistent with moderate chronic microvascular
ischemic changes. Mild brain parenchymal volume loss.

Vascular: Normal flow voids.

Skull and upper cervical spine: Normal marrow signal.

Sinuses/Orbits: No abnormal signal of paranasal sinuses. Right
mastoid effusion. Right intra-ocular lens replacement.

Other: None.
IMPRESSION: 1. No acute intracranial abnormality identified.
2. Stable moderate chronic microvascular ischemic changes and mild
parenchymal volume loss of the brain.
3. Right mastoid effusion.

By: Disya Sabanal M.D.

## 2019-04-01 ENCOUNTER — Other Ambulatory Visit: Payer: Self-pay | Admitting: Cardiology

## 2019-04-10 ENCOUNTER — Other Ambulatory Visit: Payer: Self-pay | Admitting: Cardiology

## 2019-04-17 ENCOUNTER — Telehealth: Payer: Self-pay

## 2019-04-17 ENCOUNTER — Telehealth: Payer: Self-pay | Admitting: Cardiology

## 2019-04-17 MED ORDER — AMLODIPINE BESYLATE 5 MG PO TABS: 5.0000 mg | ORAL_TABLET | Freq: Every day | ORAL | 0 refills | Status: DC

## 2019-04-17 MED ORDER — CLOPIDOGREL BISULFATE 75 MG PO TABS: 75.0000 mg | ORAL_TABLET | Freq: Every day | ORAL | 0 refills | Status: DC

## 2019-04-17 NOTE — Telephone Encounter (Signed)
Pres[facility] until patients husband gets better, and she can make an appointment with Dr. Meda Coffee.

## 2019-04-17 NOTE — Telephone Encounter (Signed)
New Message     *STAT* If patient is at the pharmacy, call can be transferred to refill team.   1. Which medications need to be refilled? (please list name of each medication and dose if known) Cloidogrel 75mg  and Amlodipine 5mg   2. Which pharmacy/location (including street and city if local pharmacy) is [facility] [address]  3. Do they need a 30 day or 90 day supply? 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

## 2019-04-18 ENCOUNTER — Other Ambulatory Visit: Payer: Self-pay | Admitting: Cardiology

## 2019-04-18 MED ORDER — CLOPIDOGREL BISULFATE 75 MG PO TABS: 75.0000 mg | ORAL_TABLET | Freq: Every day | ORAL | 1 refills | Status: AC

## 2019-04-18 MED ORDER — AMLODIPINE BESYLATE 5 MG PO TABS: 5.0000 mg | ORAL_TABLET | Freq: Every day | ORAL | 1 refills | Status: AC

## 2019-04-18 NOTE — Telephone Encounter (Signed)
Per pt call schedule appt for 05/28/19

## 2019-04-18 NOTE — Telephone Encounter (Signed)
Pt's medications were sent to pt's pharmacy as requested. Confirmation received.  

## 2019-04-18 NOTE — Telephone Encounter (Signed)
Pt's medications has already been addressed and sent to pt's pharmacy.

## 2019-04-18 NOTE — Addendum Note (Signed)
Addended by: Derl Barrow on: 04/18/2019 09:50 AM   Modules accepted: Orders

## 2019-04-29 ENCOUNTER — Other Ambulatory Visit: Payer: Self-pay | Admitting: Cardiology

## 2019-05-23 ENCOUNTER — Other Ambulatory Visit: Payer: Self-pay | Admitting: Cardiology

## 2019-05-28 ENCOUNTER — Ambulatory Visit: Payer: Medicare Other | Admitting: Physician Assistant

## 2019-05-31 ENCOUNTER — Telehealth: Payer: Self-pay | Admitting: Family Medicine

## 2019-05-31 NOTE — Telephone Encounter (Signed)
Will forward to MD to advise. Yousef Huge,CMA  

## 2019-05-31 NOTE — Telephone Encounter (Signed)
Patient's daughter is needing a letter stating that because of the patient's age and health conditions, she will need to stay with them and will need to work remotely due to [employer] and working in a school and patient's daughter does not want to go inside the school to work.  Daughter name is Nani Skillern, phone [phone] and letter can be mailed to the patient's address.

## 2019-06-02 NOTE — Telephone Encounter (Signed)
Attempted to call patient's daughter Ms. Cletus Gash to get more information on her request. No answer, will try again later.

## 2019-06-09 ENCOUNTER — Emergency Department (HOSPITAL_COMMUNITY): Payer: Medicare Other

## 2019-06-09 ENCOUNTER — Inpatient Hospital Stay (HOSPITAL_COMMUNITY): Payer: Medicare Other | Admitting: Anesthesiology

## 2019-06-09 ENCOUNTER — Inpatient Hospital Stay (HOSPITAL_COMMUNITY): Payer: Medicare Other

## 2019-06-09 ENCOUNTER — Encounter (HOSPITAL_COMMUNITY): Admission: EM | Disposition: E | Payer: Self-pay | Source: Home / Self Care | Attending: Neurology

## 2019-06-09 ENCOUNTER — Inpatient Hospital Stay (HOSPITAL_COMMUNITY)
Admission: EM | Admit: 2019-06-09 | Discharge: 2019-07-12 | DRG: 023 | Disposition: E | Payer: Medicare Other | Attending: Neurology | Admitting: Neurology

## 2019-06-09 ENCOUNTER — Encounter (HOSPITAL_COMMUNITY): Payer: Self-pay | Admitting: Anesthesiology

## 2019-06-09 DIAGNOSIS — I10 Essential (primary) hypertension: Secondary | ICD-10-CM | POA: Diagnosis present

## 2019-06-09 DIAGNOSIS — Z833 Family history of diabetes mellitus: Secondary | ICD-10-CM | POA: Diagnosis not present

## 2019-06-09 DIAGNOSIS — Z880 Allergy status to penicillin: Secondary | ICD-10-CM

## 2019-06-09 DIAGNOSIS — Z87891 Personal history of nicotine dependence: Secondary | ICD-10-CM | POA: Diagnosis not present

## 2019-06-09 DIAGNOSIS — K219 Gastro-esophageal reflux disease without esophagitis: Secondary | ICD-10-CM | POA: Diagnosis present

## 2019-06-09 DIAGNOSIS — Z8673 Personal history of transient ischemic attack (TIA), and cerebral infarction without residual deficits: Secondary | ICD-10-CM

## 2019-06-09 DIAGNOSIS — Z7902 Long term (current) use of antithrombotics/antiplatelets: Secondary | ICD-10-CM

## 2019-06-09 DIAGNOSIS — Z9221 Personal history of antineoplastic chemotherapy: Secondary | ICD-10-CM | POA: Diagnosis not present

## 2019-06-09 DIAGNOSIS — R29810 Facial weakness: Secondary | ICD-10-CM | POA: Diagnosis present

## 2019-06-09 DIAGNOSIS — D473 Essential (hemorrhagic) thrombocythemia: Secondary | ICD-10-CM | POA: Diagnosis not present

## 2019-06-09 DIAGNOSIS — J988 Other specified respiratory disorders: Secondary | ICD-10-CM

## 2019-06-09 DIAGNOSIS — I63132 Cerebral infarction due to embolism of left carotid artery: Secondary | ICD-10-CM | POA: Diagnosis not present

## 2019-06-09 DIAGNOSIS — R1310 Dysphagia, unspecified: Secondary | ICD-10-CM | POA: Diagnosis present

## 2019-06-09 DIAGNOSIS — I959 Hypotension, unspecified: Secondary | ICD-10-CM | POA: Diagnosis not present

## 2019-06-09 DIAGNOSIS — D62 Acute posthemorrhagic anemia: Secondary | ICD-10-CM | POA: Diagnosis present

## 2019-06-09 DIAGNOSIS — I6601 Occlusion and stenosis of right middle cerebral artery: Secondary | ICD-10-CM

## 2019-06-09 DIAGNOSIS — I63411 Cerebral infarction due to embolism of right middle cerebral artery: Principal | ICD-10-CM

## 2019-06-09 DIAGNOSIS — E46 Unspecified protein-calorie malnutrition: Secondary | ICD-10-CM | POA: Diagnosis present

## 2019-06-09 DIAGNOSIS — R414 Neurologic neglect syndrome: Secondary | ICD-10-CM | POA: Diagnosis present

## 2019-06-09 DIAGNOSIS — G936 Cerebral edema: Secondary | ICD-10-CM | POA: Diagnosis present

## 2019-06-09 DIAGNOSIS — G9349 Other encephalopathy: Secondary | ICD-10-CM | POA: Diagnosis present

## 2019-06-09 DIAGNOSIS — J9811 Atelectasis: Secondary | ICD-10-CM | POA: Diagnosis present

## 2019-06-09 DIAGNOSIS — I2510 Atherosclerotic heart disease of native coronary artery without angina pectoris: Secondary | ICD-10-CM | POA: Diagnosis present

## 2019-06-09 DIAGNOSIS — Z9282 Status post administration of tPA (rtPA) in a different facility within the last 24 hours prior to admission to current facility: Secondary | ICD-10-CM | POA: Diagnosis not present

## 2019-06-09 DIAGNOSIS — J9600 Acute respiratory failure, unspecified whether with hypoxia or hypercapnia: Secondary | ICD-10-CM | POA: Diagnosis present

## 2019-06-09 DIAGNOSIS — I69391 Dysphagia following cerebral infarction: Secondary | ICD-10-CM

## 2019-06-09 DIAGNOSIS — I9589 Other hypotension: Secondary | ICD-10-CM | POA: Diagnosis not present

## 2019-06-09 DIAGNOSIS — Z20828 Contact with and (suspected) exposure to other viral communicable diseases: Secondary | ICD-10-CM | POA: Diagnosis present

## 2019-06-09 DIAGNOSIS — Z515 Encounter for palliative care: Secondary | ICD-10-CM | POA: Diagnosis not present

## 2019-06-09 DIAGNOSIS — Z978 Presence of other specified devices: Secondary | ICD-10-CM | POA: Diagnosis not present

## 2019-06-09 DIAGNOSIS — R4781 Slurred speech: Secondary | ICD-10-CM | POA: Diagnosis present

## 2019-06-09 DIAGNOSIS — D75839 Thrombocytosis, unspecified: Secondary | ICD-10-CM | POA: Diagnosis not present

## 2019-06-09 DIAGNOSIS — C189 Malignant neoplasm of colon, unspecified: Secondary | ICD-10-CM | POA: Diagnosis present

## 2019-06-09 DIAGNOSIS — I6389 Other cerebral infarction: Secondary | ICD-10-CM | POA: Diagnosis not present

## 2019-06-09 DIAGNOSIS — Z85038 Personal history of other malignant neoplasm of large intestine: Secondary | ICD-10-CM

## 2019-06-09 DIAGNOSIS — Z9049 Acquired absence of other specified parts of digestive tract: Secondary | ICD-10-CM

## 2019-06-09 DIAGNOSIS — I639 Cerebral infarction, unspecified: Secondary | ICD-10-CM | POA: Diagnosis present

## 2019-06-09 DIAGNOSIS — G8194 Hemiplegia, unspecified affecting left nondominant side: Secondary | ICD-10-CM | POA: Diagnosis present

## 2019-06-09 DIAGNOSIS — Z66 Do not resuscitate: Secondary | ICD-10-CM | POA: Diagnosis not present

## 2019-06-09 DIAGNOSIS — Z955 Presence of coronary angioplasty implant and graft: Secondary | ICD-10-CM | POA: Diagnosis not present

## 2019-06-09 DIAGNOSIS — R29717 NIHSS score 17: Secondary | ICD-10-CM | POA: Diagnosis present

## 2019-06-09 DIAGNOSIS — E785 Hyperlipidemia, unspecified: Secondary | ICD-10-CM | POA: Diagnosis present

## 2019-06-09 DIAGNOSIS — Z823 Family history of stroke: Secondary | ICD-10-CM

## 2019-06-09 DIAGNOSIS — I739 Peripheral vascular disease, unspecified: Secondary | ICD-10-CM | POA: Diagnosis present

## 2019-06-09 DIAGNOSIS — Z7982 Long term (current) use of aspirin: Secondary | ICD-10-CM

## 2019-06-09 DIAGNOSIS — Z888 Allergy status to other drugs, medicaments and biological substances status: Secondary | ICD-10-CM

## 2019-06-09 HISTORY — PX: IR ANGIO VERTEBRAL SEL SUBCLAVIAN INNOMINATE UNI R MOD SED: IMG5365

## 2019-06-09 HISTORY — PX: RADIOLOGY WITH ANESTHESIA: SHX6223

## 2019-06-09 HISTORY — PX: IR CT HEAD LTD: IMG2386

## 2019-06-09 HISTORY — PX: IR PERCUTANEOUS ART THROMBECTOMY/INFUSION INTRACRANIAL INC DIAG ANGIO: IMG6087

## 2019-06-09 LAB — I-STAT CHEM 8, ED
BUN: 17 mg/dL (ref 8–23)
Calcium, Ion: 1.2 mmol/L (ref 1.15–1.40)
Chloride: 104 mmol/L (ref 98–111)
Creatinine, Ser: 0.9 mg/dL (ref 0.44–1.00)
Glucose, Bld: 109 mg/dL — ABNORMAL HIGH (ref 70–99)
HCT: 37 % (ref 36.0–46.0)
Hemoglobin: 12.6 g/dL (ref 12.0–15.0)
Potassium: 4.1 mmol/L (ref 3.5–5.1)
Sodium: 142 mmol/L (ref 135–145)
TCO2: 29 mmol/L (ref 22–32)

## 2019-06-09 LAB — CBC
HCT: 36.8 % (ref 36.0–46.0)
Hemoglobin: 11.6 g/dL — ABNORMAL LOW (ref 12.0–15.0)
MCH: 30.1 pg (ref 26.0–34.0)
MCHC: 31.5 g/dL (ref 30.0–36.0)
MCV: 95.6 fL (ref 80.0–100.0)
Platelets: 531 10*3/uL — ABNORMAL HIGH (ref 150–400)
RBC: 3.85 MIL/uL — ABNORMAL LOW (ref 3.87–5.11)
RDW: 13.3 % (ref 11.5–15.5)
WBC: 10.3 10*3/uL (ref 4.0–10.5)
nRBC: 0 % (ref 0.0–0.2)

## 2019-06-09 LAB — DIFFERENTIAL
Abs Immature Granulocytes: 0.02 10*3/uL (ref 0.00–0.07)
Basophils Absolute: 0 10*3/uL (ref 0.0–0.1)
Basophils Relative: 0 %
Eosinophils Absolute: 0.1 10*3/uL (ref 0.0–0.5)
Eosinophils Relative: 1 %
Immature Granulocytes: 0 %
Lymphocytes Relative: 29 %
Lymphs Abs: 3 10*3/uL (ref 0.7–4.0)
Monocytes Absolute: 1 10*3/uL (ref 0.1–1.0)
Monocytes Relative: 10 %
Neutro Abs: 6.1 10*3/uL (ref 1.7–7.7)
Neutrophils Relative %: 60 %

## 2019-06-09 LAB — POCT I-STAT 7, (LYTES, BLD GAS, ICA,H+H)
Acid-Base Excess: 1 mmol/L (ref 0.0–2.0)
Bicarbonate: 28.4 mmol/L — ABNORMAL HIGH (ref 20.0–28.0)
Calcium, Ion: 1.2 mmol/L (ref 1.15–1.40)
HCT: 34 % — ABNORMAL LOW (ref 36.0–46.0)
Hemoglobin: 11.6 g/dL — ABNORMAL LOW (ref 12.0–15.0)
O2 Saturation: 100 %
Potassium: 4 mmol/L (ref 3.5–5.1)
Sodium: 140 mmol/L (ref 135–145)
TCO2: 30 mmol/L (ref 22–32)
pCO2 arterial: 55.8 mmHg — ABNORMAL HIGH (ref 32.0–48.0)
pH, Arterial: 7.314 — ABNORMAL LOW (ref 7.350–7.450)
pO2, Arterial: 317 mmHg — ABNORMAL HIGH (ref 83.0–108.0)

## 2019-06-09 LAB — COMPREHENSIVE METABOLIC PANEL
ALT: 11 U/L (ref 0–44)
AST: 18 U/L (ref 15–41)
Albumin: 3.9 g/dL (ref 3.5–5.0)
Alkaline Phosphatase: 39 U/L (ref 38–126)
Anion gap: 14 (ref 5–15)
BUN: 15 mg/dL (ref 8–23)
CO2: 25 mmol/L (ref 22–32)
Calcium: 9.7 mg/dL (ref 8.9–10.3)
Chloride: 104 mmol/L (ref 98–111)
Creatinine, Ser: 0.96 mg/dL (ref 0.44–1.00)
GFR calc Af Amer: >60 mL/min (ref >60)
GFR calc non Af Amer: 55 mL/min — ABNORMAL LOW (ref >60)
Glucose, Bld: 111 mg/dL — ABNORMAL HIGH (ref 70–99)
Potassium: 4.1 mmol/L (ref 3.5–5.1)
Sodium: 143 mmol/L (ref 135–145)
Total Bilirubin: 0.5 mg/dL (ref 0.3–1.2)
Total Protein: 7.5 g/dL (ref 6.5–8.1)

## 2019-06-09 LAB — CBG MONITORING, ED: Glucose-Capillary: 98 mg/dL (ref 70–99)

## 2019-06-09 LAB — PROTIME-INR
INR: 1 (ref 0.8–1.2)
Prothrombin Time: 12.8 seconds (ref 11.4–15.2)

## 2019-06-09 LAB — TROPONIN I (HIGH SENSITIVITY)
Troponin I (High Sensitivity): 14 ng/L (ref <18)
Troponin I (High Sensitivity): 14 ng/L (ref <18)

## 2019-06-09 LAB — APTT: aPTT: 23 seconds — ABNORMAL LOW (ref 24–36)

## 2019-06-09 LAB — SARS CORONAVIRUS 2 BY RT PCR (HOSPITAL ORDER, PERFORMED IN ~~LOC~~ HOSPITAL LAB): SARS Coronavirus 2: NEGATIVE

## 2019-06-09 SURGERY — RADIOLOGY WITH ANESTHESIA
Anesthesia: General

## 2019-06-09 MED ORDER — PROPOFOL 500 MG/50ML IV EMUL
INTRAVENOUS | Status: DC | PRN
  Administered 2019-06-09: 75 ug/kg/min via INTRAVENOUS

## 2019-06-09 MED ORDER — PANTOPRAZOLE SODIUM 40 MG IV SOLR
40.0000 mg | Freq: Every day | INTRAVENOUS | Status: DC
  Administered 2019-06-09 – 2019-06-10 (×2): 40 mg via INTRAVENOUS
  Filled 2019-06-09 (×2): qty 40

## 2019-06-09 MED ORDER — SENNOSIDES-DOCUSATE SODIUM 8.6-50 MG PO TABS: 1.0000 | ORAL_TABLET | Freq: Every evening | ORAL | Status: DC | PRN

## 2019-06-09 MED ORDER — TICAGRELOR 90 MG PO TABS
ORAL_TABLET | ORAL | Status: AC
  Filled 2019-06-09: qty 2

## 2019-06-09 MED ORDER — SUCCINYLCHOLINE CHLORIDE 20 MG/ML IJ SOLN
INTRAMUSCULAR | Status: DC | PRN
  Administered 2019-06-09: 140 mg via INTRAVENOUS

## 2019-06-09 MED ORDER — IOHEXOL 300 MG/ML  SOLN
150.0000 mL | Freq: Once | INTRAMUSCULAR | Status: AC | PRN
  Administered 2019-06-09: 102 mL via INTRA_ARTERIAL

## 2019-06-09 MED ORDER — ALTEPLASE 30 MG/30 ML FOR INTERV. RAD
1.0000 mg | INTRA_ARTERIAL | Status: AC
  Administered 2019-06-09: 3 mg via INTRA_ARTERIAL
  Filled 2019-06-09: qty 30

## 2019-06-09 MED ORDER — FENTANYL 2500MCG IN NS 250ML (10MCG/ML) PREMIX INFUSION
25.0000 ug/h | INTRAVENOUS | Status: DC
  Administered 2019-06-09: 50 ug/h via INTRAVENOUS

## 2019-06-09 MED ORDER — IOHEXOL 350 MG/ML SOLN
100.0000 mL | Freq: Once | INTRAVENOUS | Status: AC | PRN
  Administered 2019-06-09: 150 mL via INTRAVENOUS

## 2019-06-09 MED ORDER — PHENYLEPHRINE HCL-NACL 10-0.9 MG/250ML-% IV SOLN
INTRAVENOUS | Status: DC | PRN
  Administered 2019-06-09: 50 ug/min via INTRAVENOUS

## 2019-06-09 MED ORDER — FENTANYL CITRATE (PF) 100 MCG/2ML IJ SOLN
INTRAMUSCULAR | Status: DC | PRN
  Administered 2019-06-09: 50 ug via INTRAVENOUS

## 2019-06-09 MED ORDER — CLOPIDOGREL BISULFATE 300 MG PO TABS
ORAL_TABLET | ORAL | Status: AC
  Filled 2019-06-09: qty 1

## 2019-06-09 MED ORDER — SUGAMMADEX SODIUM 200 MG/2ML IV SOLN
INTRAVENOUS | Status: DC | PRN
  Administered 2019-06-09: 110 mg via INTRAVENOUS

## 2019-06-09 MED ORDER — ACETAMINOPHEN 325 MG PO TABS: 650.0000 mg | ORAL_TABLET | ORAL | Status: DC | PRN

## 2019-06-09 MED ORDER — SODIUM CHLORIDE 0.9% FLUSH: 3.0000 mL | Freq: Once | INTRAVENOUS | Status: DC

## 2019-06-09 MED ORDER — CLEVIDIPINE BUTYRATE 0.5 MG/ML IV EMUL
INTRAVENOUS | Status: AC
  Filled 2019-06-09: qty 50

## 2019-06-09 MED ORDER — LACTATED RINGERS IV SOLN
INTRAVENOUS | Status: DC | PRN
  Administered 2019-06-09: 17:00:00 via INTRAVENOUS

## 2019-06-09 MED ORDER — FENTANYL CITRATE (PF) 100 MCG/2ML IJ SOLN: INTRAMUSCULAR | Status: DC | PRN

## 2019-06-09 MED ORDER — ACETAMINOPHEN 160 MG/5ML PO SOLN: 650.0000 mg | ORAL | Status: DC | PRN

## 2019-06-09 MED ORDER — ONDANSETRON HCL 4 MG/2ML IJ SOLN
INTRAMUSCULAR | Status: DC | PRN
  Administered 2019-06-09: 4 mg via INTRAVENOUS

## 2019-06-09 MED ORDER — SODIUM CHLORIDE 0.9 % IV SOLN
50.0000 mL | Freq: Once | INTRAVENOUS | Status: AC
  Administered 2019-06-09: 50 mL via INTRAVENOUS

## 2019-06-09 MED ORDER — PROPOFOL 10 MG/ML IV BOLUS
INTRAVENOUS | Status: DC | PRN
  Administered 2019-06-09: 40 mg via INTRAVENOUS
  Administered 2019-06-09: 100 mg via INTRAVENOUS

## 2019-06-09 MED ORDER — FENTANYL CITRATE (PF) 100 MCG/2ML IJ SOLN
INTRAMUSCULAR | Status: AC
  Filled 2019-06-09: qty 2

## 2019-06-09 MED ORDER — FENTANYL CITRATE (PF) 100 MCG/2ML IJ SOLN: 25.0000 ug | Freq: Once | INTRAMUSCULAR | Status: DC

## 2019-06-09 MED ORDER — FENTANYL BOLUS VIA INFUSION
25.0000 ug | INTRAVENOUS | Status: DC | PRN
  Filled 2019-06-09: qty 25

## 2019-06-09 MED ORDER — NITROGLYCERIN 1 MG/10 ML FOR IR/CATH LAB
INTRA_ARTERIAL | Status: AC
  Administered 2019-06-09: 50 ug via INTRA_ARTERIAL
  Filled 2019-06-09: qty 10

## 2019-06-09 MED ORDER — ASPIRIN 81 MG PO CHEW
CHEWABLE_TABLET | ORAL | Status: AC
  Filled 2019-06-09: qty 1

## 2019-06-09 MED ORDER — CLEVIDIPINE BUTYRATE 0.5 MG/ML IV EMUL
0.0000 mg/h | INTRAVENOUS | Status: AC
  Administered 2019-06-10: 16:00:00 2 mg/h via INTRAVENOUS
  Filled 2019-06-09: qty 50

## 2019-06-09 MED ORDER — SODIUM CHLORIDE 0.9 % IV SOLN
INTRAVENOUS | Status: DC
  Administered 2019-06-10: via INTRAVENOUS

## 2019-06-09 MED ORDER — STROKE: EARLY STAGES OF RECOVERY BOOK: Freq: Once | Status: DC

## 2019-06-09 MED ORDER — FENTANYL 2500MCG IN NS 250ML (10MCG/ML) PREMIX INFUSION
0.0000 ug/h | INTRAVENOUS | Status: DC
  Filled 2019-06-09: qty 250

## 2019-06-09 MED ORDER — CHLORHEXIDINE GLUCONATE CLOTH 2 % EX PADS
6.0000 | MEDICATED_PAD | Freq: Every day | CUTANEOUS | Status: DC
  Administered 2019-06-10: 6 via TOPICAL

## 2019-06-09 MED ORDER — EPTIFIBATIDE 20 MG/10ML IV SOLN
INTRAVENOUS | Status: AC
  Filled 2019-06-09: qty 10

## 2019-06-09 MED ORDER — ACETAMINOPHEN 650 MG RE SUPP: 650.0000 mg | RECTAL | Status: DC | PRN

## 2019-06-09 MED ORDER — TIROFIBAN HCL IN NACL 5-0.9 MG/100ML-% IV SOLN
INTRAVENOUS | Status: AC
  Filled 2019-06-09: qty 100

## 2019-06-09 MED ORDER — SODIUM CHLORIDE 0.9 % IV SOLN
INTRAVENOUS | Status: DC
  Administered 2019-06-10 – 2019-06-11 (×2): via INTRAVENOUS

## 2019-06-09 MED ORDER — DEXAMETHASONE SODIUM PHOSPHATE 4 MG/ML IJ SOLN
INTRAMUSCULAR | Status: DC | PRN
  Administered 2019-06-09: 4 mg via INTRAVENOUS

## 2019-06-09 MED ORDER — ROCURONIUM BROMIDE 100 MG/10ML IV SOLN
INTRAVENOUS | Status: DC | PRN
  Administered 2019-06-09: 50 mg via INTRAVENOUS

## 2019-06-09 MED ORDER — PHENYLEPHRINE HCL (PRESSORS) 10 MG/ML IV SOLN
INTRAVENOUS | Status: DC | PRN
  Administered 2019-06-09 (×3): 80 ug via INTRAVENOUS

## 2019-06-09 MED ORDER — VANCOMYCIN HCL 1000 MG IV SOLR
INTRAVENOUS | Status: DC | PRN
  Administered 2019-06-09: 1000 mg via INTRAVENOUS

## 2019-06-09 MED ORDER — LIDOCAINE HCL (CARDIAC) PF 50 MG/5ML IV SOSY
PREFILLED_SYRINGE | INTRAVENOUS | Status: DC | PRN
  Administered 2019-06-09: 40 mg via INTRAVENOUS

## 2019-06-09 MED ORDER — VANCOMYCIN HCL IN DEXTROSE 1-5 GM/200ML-% IV SOLN
INTRAVENOUS | Status: AC
  Filled 2019-06-09: qty 200

## 2019-06-09 MED ORDER — ALTEPLASE 50 MG IV SOLR
0.9000 mg/kg | Freq: Once | INTRAVENOUS | Status: AC
  Administered 2019-06-09: 48 mg via INTRAVENOUS
  Filled 2019-06-09: qty 50

## 2019-06-09 NOTE — Transfer of Care (Signed)
Immediate Anesthesia Transfer of Care Note  Patient: Natalie [name]  Procedure(s) Performed: RADIOLOGY WITH ANESTHESIA (N/A )  Patient Location: ICU  Anesthesia Type:General  Level of Consciousness: Patient remains intubated per anesthesia plan  Airway & Oxygen Therapy: Patient remains intubated per anesthesia plan and Patient placed on Ventilator (see vital sign flow sheet for setting)  Post-op Assessment: Report given to RN and Post -op Vital signs reviewed and stable  Post vital signs: reviewed and stable.  Last Vitals:  Vitals Value Taken Time  BP    Temp    Pulse    Resp    SpO2 100 % 05/27/2019 1908    Last Pain:  Vitals:   05/30/2019 1653  PainSc: 6          Complications: No apparent anesthesia complications.  Paitent moving right side, small movements in left leg.  Reaches for ETT with right hand.

## 2019-06-09 NOTE — Progress Notes (Signed)
Patient ID: Natalie [name], female   DOB: [birth year], 85 y.o.   MRN: [id number] INR  POst procedure . CT brain NO ICH or mass effect or shift. RT groin sheath left in place due to IV tpa. RT groin soft. Distal pulses palpable DP and PT bilaterally. To remain intubated per anesthesia for airway protection. S.Henery Betzold MD

## 2019-06-09 NOTE — Progress Notes (Signed)
Patient ID: Natalie [name], female   DOB: May 01, 1934, 85 y.o.   MRN: [id number] INR. 64 Y RH F LSW 305 pm . New onset of slurred speech and Lt sided weakness.RT gaze and LT sided neglect. NIHSS 17  MRSS 0. CT brain NO ICH . ASPECTS 10. CTA occluded M2/M3 regions of inf and sup divisions of RT MCA. Option of endovascular revascularization D/W spouse and daughter via a 3 way call with DR Erlinda Hong. Procedure ,reasons and alternatives reviewed. Risks of ICH of 10 % ,worsening neuro function,death and inability to revascularize discussed. Spouse and daughter expressed understanding and provided consent to proceed. S.Chadwick Reiswig MD

## 2019-06-09 NOTE — Anesthesia Preprocedure Evaluation (Signed)
Anesthesia Evaluation  Patient identified by MRN, date of birth, ID band Patient confused    Reviewed: NPO status , Patient's Chart, lab work & pertinent test results, Unable to perform ROS - Chart review only  Airway Mallampati: II  TM Distance: >3 FB   Mouth opening: Limited Mouth Opening  Dental  (+) Edentulous Upper   Pulmonary former smoker,    breath sounds clear to auscultation       Cardiovascular hypertension,  Rhythm:Regular Rate:Tachycardia     Neuro/Psych    GI/Hepatic   Endo/Other    Renal/GU      Musculoskeletal   Abdominal   Peds  Hematology   Anesthesia Other Findings   Reproductive/Obstetrics                             Anesthesia Physical Anesthesia Plan  ASA: IV and emergent  Anesthesia Plan: General   Post-op Pain Management:    Induction: Intravenous, Rapid sequence and Cricoid pressure planned  PONV Risk Score and Plan: Ondansetron and Dexamethasone  Airway Management Planned:   Additional Equipment: Arterial line  Intra-op Plan:   Post-operative Plan: Possible Post-op intubation/ventilation  Informed Consent: I have reviewed the patients History and Physical, chart, labs and discussed the procedure including the risks, benefits and alternatives for the proposed anesthesia with the patient or authorized representative who has indicated his/her understanding and acceptance.       Plan Discussed with: CRNA and Anesthesiologist  Anesthesia Plan Comments:         Anesthesia Quick Evaluation

## 2019-06-09 NOTE — Progress Notes (Signed)
Anesthesiology note:  Natalie [name] is an 85 year old female with a past medical history of TIA 04/2013, HTN, HLD, colon cancer s/p resection 2000, CAD s/p stent 2007, PAD. 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 Estanislado Pandy.  Prior to the procedure when we first evaluated her, she was confused not following commands or answering questions. She had marked left-sided weakness and was thrashing about with her right arm and leg and attempting to turn to her to the her side.  She was then intubated and underwent the endovascular procedure.  She was noted to have good revascularization of her right MCA territory.  However following the procedure while she was still intubated, she appeared restless and was not following commands and minimally moving her her left arm.  Because she had had TPA and had a right femoral arterial sheath and it was elected to keep her in intubated and sedated with propofol overnight rather than  to extubate her.  She was then brought to [address] 17 in stable condition.  Roberts Gaudy, MD

## 2019-06-09 NOTE — Progress Notes (Signed)
PHARMACIST CODE STROKE RESPONSE  Notified to mix tPA at 1602 by Dr. Erlinda Hong Delivered tPA to RN at 1607  tPA dose = 4.8 mg bolus over 1 minute followed by 43.2 mg for a total dose of 48 mg over 1 hour  Issues/delays encountered (if applicable): Establishing LNW time and consent from family.  Duanne Limerick PharmD. BCPS  06/06/2019 6:12 PM

## 2019-06-09 NOTE — ED Triage Notes (Signed)
Pt arrives via EMS as code stroke, lsn 1505 by family when she had sudden onset L sided paralysis, L facial droop, and slurred speech with right sided gaze. EMS reports A/O x 4 en route, more lethargic and nauseas on arrival to ED.

## 2019-06-09 NOTE — ED Notes (Signed)
[facility] called @ 1625-per Sarah, RN called by Levada Dy

## 2019-06-09 NOTE — H&P (Signed)
Stroke Neurology H&P Note  Consult Requested by: Dr. Wilhemena Durie  Reason for Consult:  Left hemiplegia / slurred speech   Consult Date: 06/06/2019  History of Present Illness:  Natalie [name] is a 85 y.o. African American female with With PMH TIA 04/2013, HTN, HLD, colon cancer s/p resection 2000, CAD s/p stent, PAD who presented to [facility] ED as a code stroke with c/o slurred speech and left sided weakness.  The history was obtained from the daughter and EMS.  Per daughter patient was lying in bed watching TV completely normal and at 1505 she suddenly had a slurred speech and then was found to have left sided weakness. EMS was called. EMS also found pt also has right gaze and left neglect. Code stroke activated. On ED arrival, pt NIHSS = 17. CT no bleeding. tPA started. CTA head and neck showed right M2/M3 occlusion, discussed with IR and would proceed with MT after risk and benefit discussion with family.  CTH: nothing acute. BG: 109 BP: 140/98 CTA: right M2/M3 occlusion LSN: 1505 tPA Given: Yes, started at 1608  Past Medical History:  Diagnosis Date  . Back pain    with radiculopathy  . CAD (coronary artery disease)    prior DES Jan 2007 with low risk myoview in 2008  . Colon cancer ([place])    prior adenocarcinoma of cecum in 2000 s/p chemo and colon resection  . GERD (gastroesophageal reflux disease)   . Glaucoma suspect   . HTN (hypertension)   . Hyperlipidemia   . Shingles 06/08/2017  . TIA (transient ischemic attack)   . UTI (urinary tract infection)     Past Surgical History:  Procedure Laterality Date  . ABDOMINAL HYSTERECTOMY    . BACK SURGERY    . CORONARY STENT PLACEMENT  Jan 2007   DES to RCA  . Microdiscectomy     L4-L5    Family History  Problem Relation Age of Onset  . Stroke Father        Deceased, 22s  . Diabetes Brother   . Healthy Son     Social History:  reports that she quit smoking about 25 years ago. Her smoking use included cigarettes. She  started smoking about 63 years ago. She has a 19.00 pack-year smoking history. She has never used smokeless tobacco. She reports that she does not drink alcohol or use drugs.  Allergies:  Allergies  Allergen Reactions  . Claritin [Loratadine] Other (See Comments)    Headaches, Sinus pain, Swelling of the sinuses  . Crestor [Rosuvastatin Calcium] Other (See Comments)    Muscle aches and pains   . Lipitor [Atorvastatin] Other (See Comments)    Muscle aches on 40 mg daily  . Oxycontin [Oxycodone Hcl] Nausea And Vomiting  . Gabapentin   . Penicillins Rash    No current facility-administered medications on file prior to encounter.    Current Outpatient Medications on File Prior to Encounter  Medication Sig Dispense Refill  . acetaminophen (TYLENOL) 500 MG tablet Take 1,500 mg by mouth 3 (three) times daily as needed for moderate pain.    Marland Kitchen amitriptyline (ELAVIL) 10 MG tablet Take 1 tablet (10 mg total) by mouth at bedtime. 30 tablet 0  . amLODipine (NORVASC) 5 MG tablet Take 1 tablet (5 mg total) by mouth daily. Please make overdue appt with Dr. Meda Coffee before anymore refills. 3rd attempt 30 tablet 1  . aspirin EC 81 MG tablet Take 81 mg by mouth daily.    . capsicum  oleoresin (TRIXAICIN) 0.025 % cream Apply 1 application topically 3 (three) times daily. 56.6 g 0  . clopidogrel (PLAVIX) 75 MG tablet Take 1 tablet (75 mg total) by mouth daily. Please make overdue appt with Dr. Meda Coffee before anymore refills. 3rd Attempt 30 tablet 1  . diclofenac sodium (VOLTAREN) 1 % GEL APPLY 2 GRAMS TO AFFECTED AREA 4 TIMES A DAY (Patient taking differently: Apply 2 g topically 4 (four) times daily. ) 100 g 0  . lidocaine (XYLOCAINE) 5 % ointment Apply 1 application topically as needed. 35.44 g 0  . metoprolol tartrate (LOPRESSOR) 50 MG tablet Take 1 tablet (50 mg total) by mouth 2 (two) times daily. Please keep upcoming appt in February with Dr. Meda Coffee before anymore refills. Thank you 60 tablet 2  .  nitroGLYCERIN (NITROSTAT) 0.4 MG SL tablet Place 1 tablet (0.4 mg total) under the tongue every 5 (five) minutes as needed for chest pain. 25 tablet 4  . umeclidinium-vilanterol (ANORO ELLIPTA) 62.5-25 MCG/INH AEPB Inhale 1 puff into the lungs daily. 4 each 0    Review of Systems: A full ROS was attempted today and was able to be performed with daughter.  Systems assessed include - Constitutional, Eyes, HENT, Respiratory, Cardiovascular, Gastrointestinal, Genitourinary, Integument/breast, Hematologic/lymphatic, Musculoskeletal, Neurological, Behavioral/Psych, Endocrine, Allergic/Immunologic - with pertinent responses as per HPI.  Physical Examination: Pulse Rate:  [97-102] 97 (11/29 1654) Resp:  [17-20] 20 (11/29 1654) BP: (169-176)/(76-83) 176/83 (11/29 1654) Weight:  [53.3 kg] 53.3 kg (11/29 1600)  General - well nourished, well developed, lethargic and mild agitation.    Ophthalmologic - fundi not visualized due to noncooperation.    Cardiovascular - regular rhythm and rate  Neuro - awake, alert, mild lethargic and agitation. Orientated to self, age and time. No aphasia, paucity of speech, able to name and repeat but severe dysarthria. Right gaze barely cross midline. Left neglect, left hemianopia. Left facial droop and left hemiplegia. Sensory neglect on the left. And right FTN intact. Gait not tested.  NIHSS Level Of Consciousness 0=Alert; keenly responsive 1=Not alert, but arousable by minor stimulation 2=Not alert, requires repeated stimulation 3=Responds only with reflex movements 0  LOC Questions to Month and Age 31=Answers both questions correctly 1=Answers one question correctly 2=Answers neither question correctly 0  LOC Commands      -Open/Close eyes     -Open/close grip 0=Performs both tasks correctly 1=Performs one task correctly 2=Performs neighter task correctly 0  Best Gaze 0=Normal 1=Partial gaze palsy 2=Forced deviation, or total gaze paresis 1  Visual 0=No  visual loss 1=Partial hemianopia 2=Complete hemianopia 3=Bilateral hemianopia (blind including cortical blindness) 2  Facial Palsy 0=Normal symmetrical movement 1=Minor paralysis (asymmetry) 2=Partial paralysis (lower face) 3=Complete paralysis (upper and lower face) 2  Motor   0=No drift, limb holds posture for full 10 seconds 1=Drift, limb holds posture, no drift to bed 2=Some antigravity effort, cannot maintain posture, drifts to bed 3=No effort against gravity, limb falls 4=No movement Right Arm 0     Leg 0    Left Arm 4     Leg 3  Limb Ataxia 0=Absent 1=Present in one limb 2=Present in two limbs 0  Sensory 0=Normal 1=Mild to moderate sensory loss 2=Severe to total sensory loss 1  Best Language 0=No aphasia, normal 1=Mild to moderate aphasia 2=Mute, global aphasia 3=Mute, global aphasia 0  Dysarthria 0=Normal 1=Mild to moderate 2=Severe, unintelligible or mute/anarthric 2  Extinction/Neglect 0=No abnormality 1=Extinction to bilateral simultaneous stimulation 2=Profound neglect 2  Total  17      Data Reviewed: Ct Code Stroke Cta Head W/wo Contrast  Result Date: 05/18/2019 CLINICAL DATA:  Left-sided weakness EXAM: CT ANGIOGRAPHY HEAD AND NECK TECHNIQUE: Multidetector CT imaging of the head and neck was performed using the standard protocol during bolus administration of intravenous contrast. Multiplanar CT image reconstructions and MIPs were obtained to evaluate the vascular anatomy. Carotid stenosis measurements (when applicable) are obtained utilizing NASCET criteria, using the distal internal carotid diameter as the denominator. CONTRAST:  173mL OMNIPAQUE IOHEXOL 350 MG/ML SOLN COMPARISON:  Noncontrast head CT earlier today FINDINGS: CTA NECK FINDINGS Aortic arch: Atherosclerotic plaque with 3 vessel branching. Plaque along the distal arch is irregular. Right carotid system: Atheromatous wall thickening of the common carotid. Moderate calcified plaque at the ICA  bulb without 50% or greater stenosis. No ulceration. Left carotid system: Calcified plaque at the common carotid origin without flow limiting stenosis. Moderate mixed density plaque at the proximal ICA with 50% ICA stenosis. Negative for dissection. Vertebral arteries: Subclavian atherosclerosis on both sides without flow limiting stenosis. The vertebral arteries are symmetric and widely patent to the dura. Skeleton: Diffuse disc and facet degeneration. No acute or aggressive finding Other neck: No acute finding Upper chest: No acute finding.  Emphysema. Review of the MIP images confirms the above findings CTA HEAD FINDINGS Anterior circulation: Cut off of 2 right M2 branches at the level of the M2 3 junction. There is poor collateral flow with oligemic appearance on multiple slices over the posterior right frontal and parietal lobes. No left-sided branch occlusion. There is plaque at both carotid siphons without flow limiting stenosis. Apparent outpouching from the left paraclinoid ICA segment is likely calcified plaque based on reformats. No convincing aneurysm. Posterior circulation: Atherosclerotic plaque on the right V4 segment. Diffusely patent vertebral and basilar arteries. No branch occlusion, beading, or aneurysm. Venous sinuses: Unremarkable in the arterial phase Anatomic variants: None significant These results were called by telephone at the time of interpretation on 06/02/2019 at 4:36 pm to Dr Erlinda Hong , who was already aware of the findings. Review of the MIP images confirms the above findings IMPRESSION: 1. Occlusion of 2 right MCA branches at the M2/3 junction. No flow limiting stenosis or embolic source in the more proximal right carotid circulation. Paucity of filling vessels along the affected right cerebral convexity-subjectively poor collateral flow. 2. 50% atheromatous narrowing at the proximal left ICA. Electronically Signed   By: Monte Fantasia M.D.   On: 06/06/2019 16:47   Ct Code Stroke Cta  Neck W/wo Contrast  Result Date: 05/19/2019 CLINICAL DATA:  Left-sided weakness EXAM: CT ANGIOGRAPHY HEAD AND NECK TECHNIQUE: Multidetector CT imaging of the head and neck was performed using the standard protocol during bolus administration of intravenous contrast. Multiplanar CT image reconstructions and MIPs were obtained to evaluate the vascular anatomy. Carotid stenosis measurements (when applicable) are obtained utilizing NASCET criteria, using the distal internal carotid diameter as the denominator. CONTRAST:  154mL OMNIPAQUE IOHEXOL 350 MG/ML SOLN COMPARISON:  Noncontrast head CT earlier today FINDINGS: CTA NECK FINDINGS Aortic arch: Atherosclerotic plaque with 3 vessel branching. Plaque along the distal arch is irregular. Right carotid system: Atheromatous wall thickening of the common carotid. Moderate calcified plaque at the ICA bulb without 50% or greater stenosis. No ulceration. Left carotid system: Calcified plaque at the common carotid origin without flow limiting stenosis. Moderate mixed density plaque at the proximal ICA with 50% ICA stenosis. Negative for dissection. Vertebral arteries: Subclavian atherosclerosis on both sides without  flow limiting stenosis. The vertebral arteries are symmetric and widely patent to the dura. Skeleton: Diffuse disc and facet degeneration. No acute or aggressive finding Other neck: No acute finding Upper chest: No acute finding.  Emphysema. Review of the MIP images confirms the above findings CTA HEAD FINDINGS Anterior circulation: Cut off of 2 right M2 branches at the level of the M2 3 junction. There is poor collateral flow with oligemic appearance on multiple slices over the posterior right frontal and parietal lobes. No left-sided branch occlusion. There is plaque at both carotid siphons without flow limiting stenosis. Apparent outpouching from the left paraclinoid ICA segment is likely calcified plaque based on reformats. No convincing aneurysm. Posterior  circulation: Atherosclerotic plaque on the right V4 segment. Diffusely patent vertebral and basilar arteries. No branch occlusion, beading, or aneurysm. Venous sinuses: Unremarkable in the arterial phase Anatomic variants: None significant These results were called by telephone at the time of interpretation on 06/07/2019 at 4:36 pm to Dr Erlinda Hong , who was already aware of the findings. Review of the MIP images confirms the above findings IMPRESSION: 1. Occlusion of 2 right MCA branches at the M2/3 junction. No flow limiting stenosis or embolic source in the more proximal right carotid circulation. Paucity of filling vessels along the affected right cerebral convexity-subjectively poor collateral flow. 2. 50% atheromatous narrowing at the proximal left ICA. Electronically Signed   By: Monte Fantasia M.D.   On: 05/13/2019 16:47   Ct Code Stroke Cerebral Perfusion With Contrast  Result Date: 06/04/2019 CLINICAL DATA:  Left-sided weakness EXAM: CT PERFUSION BRAIN TECHNIQUE: Multiphase CT imaging of the brain was performed following IV bolus contrast injection. Subsequent parametric perfusion maps were calculated using RAPID software. CONTRAST:  179mL OMNIPAQUE IOHEXOL 350 MG/ML SOLN COMPARISON:  Noncontrast head CT earlier today FINDINGS: Attempted but unsuccessful CT perfusion due to patient motion. No additional findings are seen. IMPRESSION: Attempted but failed and nondiagnostic CT perfusion due to patient motion. Electronically Signed   By: Monte Fantasia M.D.   On: 05/27/2019 16:36   Ct Head Code Stroke Wo Contrast  Result Date: 06/05/2019 CLINICAL DATA:  Code stroke.  Left-sided paralysis and facial droop EXAM: CT HEAD WITHOUT CONTRAST TECHNIQUE: Contiguous axial images were obtained from the base of the skull through the vertex without intravenous contrast. COMPARISON:  None. FINDINGS: Brain: No evidence of acute infarction, hemorrhage, hydrocephalus, extra-axial collection or mass lesion/mass effect.  Vascular: No hyperdense vessel or unexpected calcification. Skull: Normal. Negative for fracture or focal lesion. Sinuses/Orbits: No acute finding. Other: These results were communicated to Kaydence Baba at 3:55 pmon 11/11/2020by text page via the [employer] messaging system. ASPECTS [facility] Stroke Program Early CT Score) - Ganglionic level infarction (caudate, lentiform nuclei, internal capsule, insula, M1-M3 cortex): 7 - Supraganglionic infarction (M4-M6 cortex): 3 Total score (0-10 with 10 being normal): 10 IMPRESSION: No acute finding. ASPECTS is 10. Electronically Signed   By: Monte Fantasia M.D.   On: 06/09/2019 15:57    Assessment:Natalie [name] is a 85 y.o. African American female with With PMH TIA 04/2013, HTN, HLD, colon cancer s/p resection 2000, CAD s/p stenting who presented to [facility] ED as a code stroke with c/o slurred speech and left hemiplegia and left neglect right gaze. CT no bleeding, LSN 1505, pt no contraindication for tPA which was given. CTA head and neck showed right M2/M3 3 branches occlusion, after discussion with family, will go for MT.   Stroke Risk Factors - hyperlipidemia and hypertension  Plan:       -  s/p tPA       -     mechanical thrombectomy with Dr. Estanislado Pandy - consent obtained with family - Admit for routine post IV tPA care  - Check blood pressure and NIHSS every 15 min for 2 h, then every 30 min for 6 h, and finally every hour for 16 h - BP goal < 180/105 - MRA and MRI brain 24 hours post tPA - Stat CT head without contrast if acute neuro changes - NPO until swallowing screen performed and passed - No antiplatelet agents or anticoagulants (including heparin for DVT prophylaxis) in first 24 hours - Telemetry - Euglycemia  - Avoid hyperthermia, PRN acetaminophen - DVT prophylaxis with SCDs       -     HgbA1c, fasting lipid panel       -     PT consult, OT consult, Speech consult       -     Echocardiogram       -     Risk factor modification  Rosalin Hawking, MD  PhD Stroke Neurology 06/07/2019 5:21 PM  This patient is critically ill due to acute stroke with right MCA occlusion s/p tPA and at significant risk of neurological worsening, death form hemorrhagic conversion, seizure, recurrent stroke brain herniation. This patient's care requires constant monitoring of vital signs, hemodynamics, respiratory and cardiac monitoring, review of multiple databases, neurological assessment, discussion with family, other specialists and medical decision making of high complexity. I spent 70 minutes of neurocritical care time in the care of this patient. I had long discussion with daughter and husband in ER, updated pt current condition, treatment plan and potential prognosis, and answered all the questions. They expressed understanding and appreciation.

## 2019-06-09 NOTE — ED Provider Notes (Addendum)
[facility] EMERGENCY DEPARTMENT Provider Note   CSN: [id number] Arrival date & time: 05/28/2019  1540  An emergency department physician performed an initial assessment on this suspected stroke patient at 1540.  History   Chief Complaint Chief Complaint  Patient presents with   Code Stroke    HPI Natalie [name] is a 85 y.o. female.     The history is provided by the EMS personnel and medical records.   Natalie [name] is a 85 y.o. female who presents to the Emergency Department complaining of code stroke. Level V caveat due to altered mental status. History is provided by EMS. She was last seen normal at 1505 by family when she had sudden onset of left sided paralysis, left facial droop, slurred speech and right gaze preference. On ED arrival she is complaining of nausea. Past Medical History:  Diagnosis Date   Back pain    with radiculopathy   CAD (coronary artery disease)    prior DES Jan 2007 with low risk myoview in 2008   Colon cancer [facility])    prior adenocarcinoma of cecum in 2000 s/p chemo and colon resection   GERD (gastroesophageal reflux disease)    Glaucoma suspect    HTN (hypertension)    Hyperlipidemia    Shingles 11/11/202018   TIA (transient ischemic attack)    UTI (urinary tract infection)     Patient Active Problem List   Diagnosis Date Noted   Stroke ([place]) 05/12/2019   Middle cerebral artery embolism, 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   Airway compromise    Bug bite 03/25/2019   Right arm pain 06/05/2018   Rash and nonspecific skin eruption 12/08/2017   History of arterial disease of lower extremity 11/06/2017   Abnormal ankle brachial index 11/06/2017   Post herpetic neuralgia 07/13/2017   Leg pain, lateral, left 05/17/2017   Arthritis of foot, right, degenerative 12/27/2016   Health care maintenance 10/20/2015   Neck and shoulder pain 06/19/2015   TIA (transient ischemic attack) 05/08/2013   Spine  pain, lumbosacral 07/25/2011   UTI (urinary tract infection) 07/29/2009   GERD 07/20/2009   HERNIATED LUMBAR DISK WITH RADICULOPATHY 05/18/2009   RADICULOPATHY 05/14/2009   CAD, NATIVE VESSEL 09/22/2008   Colon cancer ([place]) 10/18/2007   Hyperlipidemia 10/18/2007   Essential hypertension, benign 10/18/2007    Past Surgical History:  Procedure Laterality Date   ABDOMINAL HYSTERECTOMY     BACK SURGERY     CORONARY STENT PLACEMENT  Jan 2007   DES to RCA   Microdiscectomy     L4-L5     OB History   No obstetric history on file.      Home Medications    Prior to Admission medications   Medication Sig Start Date End Date Taking? Authorizing Provider  acetaminophen (TYLENOL) 500 MG tablet Take 1,500 mg by mouth 3 (three) times daily as needed for moderate pain.    [provider]  amitriptyline (ELAVIL) 10 MG tablet Take 1 tablet (10 mg total) by mouth at bedtime. 12/08/17   Mikell, Jeani Sow, MD  amLODipine (NORVASC) 5 MG tablet Take 1 tablet (5 mg total) by mouth daily. Please make overdue appt with Dr. Meda Coffee before anymore refills. 3rd attempt 04/18/19   Dorothy Spark, MD  aspirin EC 81 MG tablet Take 81 mg by mouth daily.    [provider]  capsicum oleoresin (TRIXAICIN) 0.025 % cream Apply 1 application topically 3 (three) times daily. 07/20/17   Steve Rattler,  DO  clopidogrel (PLAVIX) 75 MG tablet Take 1 tablet (75 mg total) by mouth daily. Please make overdue appt with Dr. Meda Coffee before anymore refills. 3rd Attempt 04/18/19   Dorothy Spark, MD  diclofenac sodium (VOLTAREN) 1 % GEL APPLY 2 GRAMS TO AFFECTED AREA 4 TIMES A DAY Patient taking differently: Apply 2 g topically 4 (four) times daily.  06/05/18   Kinnie Feil, MD  lidocaine (XYLOCAINE) 5 % ointment Apply 1 application topically as needed. 03/25/19   Benay Pike, MD  metoprolol tartrate (LOPRESSOR) 50 MG tablet Take 1 tablet (50 mg total) by mouth 2 (two) times daily.  Please keep upcoming appt in February with Dr. Meda Coffee before anymore refills. Thank you 05/23/19   Dorothy Spark, MD  nitroGLYCERIN (NITROSTAT) 0.4 MG SL tablet Place 1 tablet (0.4 mg total) under the tongue every 5 (five) minutes as needed for chest pain. 10/02/15   Dorothy Spark, MD  umeclidinium-vilanterol (ANORO ELLIPTA) 62.5-25 MCG/INH AEPB Inhale 1 puff into the lungs daily. 09/30/16   Zenia Resides, MD    Family History Family History  Problem Relation Age of Onset   Stroke Father        Deceased, 69s   Diabetes Brother    Healthy Son     Social History Social History   Tobacco Use   Smoking status: Former Smoker    Packs/day: 0.50    Years: 38.00    Pack years: 19.00    Types: Cigarettes    Start date: 07/21/1955    Quit date: 08/25/1993    Years since quitting: 25.8   Smokeless tobacco: Never Used  Substance Use Topics   Alcohol use: No   Drug use: No     Allergies   Claritin [loratadine], Crestor [rosuvastatin calcium], Lipitor [atorvastatin], Oxycontin [oxycodone hcl], Gabapentin, and Penicillins   Review of Systems Review of Systems  Unable to perform ROS: Mental status change     Physical Exam Updated Vital Signs BP (!) 154/83    Pulse 85    Temp (!) 97.5 F (36.4 C) (Axillary)    Resp (!) 21    Ht _0  (1.651 m)    Wt 53.3 kg    SpO2 100%    BMI 19.55 kg/m   Physical Exam Vitals signs and nursing note reviewed.  Constitutional:      General: She is in acute distress.     Appearance: She is well-developed. She is ill-appearing.  HENT:     Head: Normocephalic and atraumatic.  Cardiovascular:     Rate and Rhythm: Normal rate and regular rhythm.     Heart sounds: No murmur.  Pulmonary:     Effort: Pulmonary effort is normal. No respiratory distress.     Breath sounds: Normal breath sounds.  Abdominal:     Palpations: Abdomen is soft.     Tenderness: There is no abdominal tenderness. There is no guarding or rebound.    Musculoskeletal:        General: No tenderness.  Skin:    General: Skin is warm and dry.  Neurological:     Mental Status: She is alert.     Comments: Confused. Left hemiparesis, right sided gaze preference. Slurred speech. Follows commands.  Psychiatric:     Comments: Unable to assess      ED Treatments / Results  Labs (all labs ordered are listed, but only abnormal results are displayed) Labs Reviewed  APTT - Abnormal; Notable for the following  components:      Result Value   aPTT 23 (*)    All other components within normal limits  CBC - Abnormal; Notable for the following components:   RBC 3.85 (*)    Hemoglobin 11.6 (*)    Platelets 531 (*)    All other components within normal limits  COMPREHENSIVE METABOLIC PANEL - Abnormal; Notable for the following components:   Glucose, Bld 111 (*)    GFR calc non Af Amer 55 (*)    All other components within normal limits  I-STAT CHEM 8, ED - Abnormal; Notable for the following components:   Glucose, Bld 109 (*)    All other components within normal limits  POCT I-STAT 7, (LYTES, BLD GAS, ICA,H+H) - Abnormal; Notable for the following components:   pH, Arterial 7.314 (*)    pCO2 arterial 55.8 (*)    pO2, Arterial 317.0 (*)    Bicarbonate 28.4 (*)    HCT 34.0 (*)    Hemoglobin 11.6 (*)    All other components within normal limits  SARS CORONAVIRUS 2 BY RT PCR (HOSPITAL ORDER, [facility])  MRSA PCR SCREENING  PROTIME-INR  DIFFERENTIAL  HEMOGLOBIN A1C  LIPID PANEL  CBC WITH DIFFERENTIAL/PLATELET  BASIC METABOLIC PANEL  CBG MONITORING, ED  TROPONIN I (HIGH SENSITIVITY)  TROPONIN I (HIGH SENSITIVITY)    EKG None ED ECG REPORT   Date: 06/10/2019  Rate: 106  Rhythm: sinus tachycardia  QRS Axis: normal  Intervals: normal  ST/T Wave abnormalities: ST elevation aVR, V1ST depressions in II, III, aVF, V4-6  Conduction Disutrbances:none  Narrative Interpretation:   Old EKG Reviewed: more  pronounced ST changes when compared to priors  I have personally reviewed the EKG tracing and disagree with the computerized printout as noted.  Radiology Ct Code Stroke Cta Head W/wo Contrast  Result Date: 05/31/2019 CLINICAL DATA:  Left-sided weakness EXAM: CT ANGIOGRAPHY HEAD AND NECK TECHNIQUE: Multidetector CT imaging of the head and neck was performed using the standard protocol during bolus administration of intravenous contrast. Multiplanar CT image reconstructions and MIPs were obtained to evaluate the vascular anatomy. Carotid stenosis measurements (when applicable) are obtained utilizing NASCET criteria, using the distal internal carotid diameter as the denominator. CONTRAST:  168m OMNIPAQUE IOHEXOL 350 MG/ML SOLN COMPARISON:  Noncontrast head CT earlier today FINDINGS: CTA NECK FINDINGS Aortic arch: Atherosclerotic plaque with 3 vessel branching. Plaque along the distal arch is irregular. Right carotid system: Atheromatous wall thickening of the common carotid. Moderate calcified plaque at the ICA bulb without 50% or greater stenosis. No ulceration. Left carotid system: Calcified plaque at the common carotid origin without flow limiting stenosis. Moderate mixed density plaque at the proximal ICA with 50% ICA stenosis. Negative for dissection. Vertebral arteries: Subclavian atherosclerosis on both sides without flow limiting stenosis. The vertebral arteries are symmetric and widely patent to the dura. Skeleton: Diffuse disc and facet degeneration. No acute or aggressive finding Other neck: No acute finding Upper chest: No acute finding.  Emphysema. Review of the MIP images confirms the above findings CTA HEAD FINDINGS Anterior circulation: Cut off of 2 right M2 branches at the level of the M2 3 junction. There is poor collateral flow with oligemic appearance on multiple slices over the posterior right frontal and parietal lobes. No left-sided branch occlusion. There is plaque at both carotid  siphons without flow limiting stenosis. Apparent outpouching from the left paraclinoid ICA segment is likely calcified plaque based on reformats. No convincing aneurysm.  Posterior circulation: Atherosclerotic plaque on the right V4 segment. Diffusely patent vertebral and basilar arteries. No branch occlusion, beading, or aneurysm. Venous sinuses: Unremarkable in the arterial phase Anatomic variants: None significant These results were called by telephone at the time of interpretation on 05/23/2019 at 4:36 pm to Dr Erlinda Hong , who was already aware of the findings. Review of the MIP images confirms the above findings IMPRESSION: 1. Occlusion of 2 right MCA branches at the M2/3 junction. No flow limiting stenosis or embolic source in the more proximal right carotid circulation. Paucity of filling vessels along the affected right cerebral convexity-subjectively poor collateral flow. 2. 50% atheromatous narrowing at the proximal left ICA. Electronically Signed   By: Monte Fantasia M.D.   On: 05/27/2019 16:47   Ct Code Stroke Cta Neck W/wo Contrast  Result Date: 05/14/2019 CLINICAL DATA:  Left-sided weakness EXAM: CT ANGIOGRAPHY HEAD AND NECK TECHNIQUE: Multidetector CT imaging of the head and neck was performed using the standard protocol during bolus administration of intravenous contrast. Multiplanar CT image reconstructions and MIPs were obtained to evaluate the vascular anatomy. Carotid stenosis measurements (when applicable) are obtained utilizing NASCET criteria, using the distal internal carotid diameter as the denominator. CONTRAST:  12m OMNIPAQUE IOHEXOL 350 MG/ML SOLN COMPARISON:  Noncontrast head CT earlier today FINDINGS: CTA NECK FINDINGS Aortic arch: Atherosclerotic plaque with 3 vessel branching. Plaque along the distal arch is irregular. Right carotid system: Atheromatous wall thickening of the common carotid. Moderate calcified plaque at the ICA bulb without 50% or greater stenosis. No ulceration. Left  carotid system: Calcified plaque at the common carotid origin without flow limiting stenosis. Moderate mixed density plaque at the proximal ICA with 50% ICA stenosis. Negative for dissection. Vertebral arteries: Subclavian atherosclerosis on both sides without flow limiting stenosis. The vertebral arteries are symmetric and widely patent to the dura. Skeleton: Diffuse disc and facet degeneration. No acute or aggressive finding Other neck: No acute finding Upper chest: No acute finding.  Emphysema. Review of the MIP images confirms the above findings CTA HEAD FINDINGS Anterior circulation: Cut off of 2 right M2 branches at the level of the M2 3 junction. There is poor collateral flow with oligemic appearance on multiple slices over the posterior right frontal and parietal lobes. No left-sided branch occlusion. There is plaque at both carotid siphons without flow limiting stenosis. Apparent outpouching from the left paraclinoid ICA segment is likely calcified plaque based on reformats. No convincing aneurysm. Posterior circulation: Atherosclerotic plaque on the right V4 segment. Diffusely patent vertebral and basilar arteries. No branch occlusion, beading, or aneurysm. Venous sinuses: Unremarkable in the arterial phase Anatomic variants: None significant These results were called by telephone at the time of interpretation on 06/04/2019 at 4:36 pm to Dr XErlinda Hong, who was already aware of the findings. Review of the MIP images confirms the above findings IMPRESSION: 1. Occlusion of 2 right MCA branches at the M2/3 junction. No flow limiting stenosis or embolic source in the more proximal right carotid circulation. Paucity of filling vessels along the affected right cerebral convexity-subjectively poor collateral flow. 2. 50% atheromatous narrowing at the proximal left ICA. Electronically Signed   By: JMonte FantasiaM.D.   On: 05/28/2019 16:47   Ct Code Stroke Cerebral Perfusion With Contrast  Result Date:  05/22/2019 CLINICAL DATA:  Left-sided weakness EXAM: CT PERFUSION BRAIN TECHNIQUE: Multiphase CT imaging of the brain was performed following IV bolus contrast injection. Subsequent parametric perfusion maps were calculated using RAPID software. CONTRAST:  1531m  OMNIPAQUE IOHEXOL 350 MG/ML SOLN COMPARISON:  Noncontrast head CT earlier today FINDINGS: Attempted but unsuccessful CT perfusion due to patient motion. No additional findings are seen. IMPRESSION: Attempted but failed and nondiagnostic CT perfusion due to patient motion. Electronically Signed   By: Monte Fantasia M.D.   On: 06/03/2019 16:36   Ct Head Code Stroke Wo Contrast  Result Date: 06/06/2019 CLINICAL DATA:  Code stroke.  Left-sided paralysis and facial droop EXAM: CT HEAD WITHOUT CONTRAST TECHNIQUE: Contiguous axial images were obtained from the base of the skull through the vertex without intravenous contrast. COMPARISON:  None. FINDINGS: Brain: No evidence of acute infarction, hemorrhage, hydrocephalus, extra-axial collection or mass lesion/mass effect. Vascular: No hyperdense vessel or unexpected calcification. Skull: Normal. Negative for fracture or focal lesion. Sinuses/Orbits: No acute finding. Other: These results were communicated to Xu at 3:55 pmon 11/25/2020by text page via the [facility] messaging system. ASPECTS [facility] Stroke Program Early CT Score) - Ganglionic level infarction (caudate, lentiform nuclei, internal capsule, insula, M1-M3 cortex): 7 - Supraganglionic infarction (M4-M6 cortex): 3 Total score (0-10 with 10 being normal): 10 IMPRESSION: No acute finding. ASPECTS is 10. Electronically Signed   By: Monte Fantasia M.D.   On: 05/30/2019 15:57    Procedures Procedures (including critical care time) CRITICAL CARE Performed by: Quintella Reichert   Total critical care time: 35 minutes  Critical care time was exclusive of separately billable procedures and treating other patients.  Critical care was necessary to treat  or prevent imminent or life-threatening deterioration.  Critical care was time spent personally by me on the following activities: development of treatment plan with patient and/or surrogate as well as nursing, discussions with consultants, evaluation of patient's response to treatment, examination of patient, obtaining history from patient or surrogate, ordering and performing treatments and interventions, ordering and review of laboratory studies, ordering and review of radiographic studies, pulse oximetry and re-evaluation of patient's condition.  Medications Ordered in ED Medications  sodium chloride flush (NS) 0.9 % injection 3 mL (has no administration in time range)   stroke: mapping our early stages of recovery book (has no administration in time range)  0.9 %  sodium chloride infusion (has no administration in time range)  senna-docusate (Senokot-S) tablet 1 tablet (has no administration in time range)  pantoprazole (PROTONIX) injection 40 mg (40 mg Intravenous Given 06/03/2019 2355)  vancomycin (VANCOCIN) 1-5 GM/200ML-% IVPB (has no administration in time range)  eptifibatide (INTEGRILIN) 20 MG/10ML injection (25 mcg  Canceled Entry 05/19/2019 1813)  acetaminophen (TYLENOL) tablet 650 mg (has no administration in time range)    Or  acetaminophen (TYLENOL) 160 MG/5ML solution 650 mg (has no administration in time range)    Or  acetaminophen (TYLENOL) suppository 650 mg (has no administration in time range)  0.9 %  sodium chloride infusion ( Intravenous New Bag/Given 06/10/19 0004)  clevidipine (CLEVIPREX) infusion 0.5 mg/mL (has no administration in time range)  clevidipine (CLEVIPREX) 0.5 MG/ML infusion (has no administration in time range)  fentaNYL (SUBLIMAZE) injection 25 mcg (has no administration in time range)  fentaNYL 2560mg in NS 2560m(1039mml) infusion-PREMIX (50 mcg/hr Intravenous New Bag/Given 05/26/2019 2003)  fentaNYL (SUBLIMAZE) bolus via infusion 25 mcg (has no  administration in time range)  Chlorhexidine Gluconate Cloth 2 % PADS 6 each (has no administration in time range)  chlorhexidine gluconate (MEDLINE KIT) (PERIDEX) 0.12 % solution 15 mL (has no administration in time range)  MEDLINE mouth rinse (has no administration in time range)  iohexol (OMNIPAQUE) 350 MG/ML  injection 100 mL (150 mLs Intravenous Contrast Given 05/29/2019 1633)  alteplase (tPA/ACTIVASE) injection 48 mg (48 mg Intravenous Given 05/17/2019 1607)    Followed by  0.9 %  sodium chloride infusion (50 mLs Intravenous New Bag/Given 06/01/2019 1650)  nitroGLYCERIN 100 mcg/mL intra-arterial injection (50 mcg Intra-arterial Given 05/14/2019 1817)  fentaNYL (SUBLIMAZE) 100 MCG/2ML injection (  Override pull for Anesthesia 06/05/2019 1732)  alteplase (ACTIVASE) 71m/30mL-for IA use/Neuro-IR (3 mg Intra-arterial New Bag/Given 06/07/2019 1808)  iohexol (OMNIPAQUE) 300 MG/ML solution 150 mL (102 mLs Intra-arterial Contrast Given 05/31/2019 1833)     Initial Impression / Assessment and Plan / ED Course  I have reviewed the triage vital signs and the nursing notes.  Pertinent labs & imaging results that were available during my care of the patient were reviewed by me and considered in my medical decision making (see chart for details).        Patient presented to the emergency department as a code stroke for left hemiparesis and right gaze preference. Patient ill appearing on ED arrival. She did have nausea but no vomiting. She was treated with the Zofran [place]. [place] was given [place] after consent was obtained from the family by neurology. Plan to transfer to neuro- IR for further intervention.   EKG with ST elevation in AVR and V1. Discussed with cardiology, current clinical picture is not consistent with ST elevation MI, will not activate a STEMI. Final Clinical Impressions(s) / ED Diagnoses   Final diagnoses:  Acute CVA (cerebrovascular  accident) ([facility]    ED Discharge Orders    None       RQuintella Reichert MD 06/10/19 08403   RQuintella Reichert MD 06/10/19 0145

## 2019-06-09 NOTE — Progress Notes (Signed)
RR increased to 16 and Fi02 decreased to 50% per MD order after ABG results

## 2019-06-09 NOTE — Anesthesia Procedure Notes (Signed)
Procedure Name: Intubation Date/Time: 05/24/2019 5:08 PM Performed by: Suzy Bouchard, CRNA Pre-anesthesia Checklist: Patient identified, Emergency Drugs available, Suction available, Patient being monitored and Timeout performed Patient Re-evaluated:Patient Re-evaluated prior to induction Oxygen Delivery Method: Circle system utilized Preoxygenation: Pre-oxygenation with 100% oxygen Induction Type: IV induction and Rapid sequence Laryngoscope Size: Glidescope and 3 Grade View: Grade I Tube type: Oral Tube size: 7.5 mm Number of attempts: 1 Airway Equipment and Method: Stylet and Video-laryngoscopy Placement Confirmation: ETT inserted through vocal cords under direct vision,  breath sounds checked- equal and bilateral and positive ETCO2 Secured at: 20 cm Tube secured with: Tape Dental Injury: Teeth and Oropharynx as per pre-operative assessment  Comments: Intubated in IR ante-room purposed for COVID/COVID suspected patients.  Immediately transferred to IR with ambu and monitor. Placed on anesthesia machine and monitor there.  Full COVID precautions practiced.

## 2019-06-09 NOTE — ED Notes (Signed)
Patients daughter Nani Skillern [phone] waiting outside for update.

## 2019-06-09 NOTE — Consult Note (Addendum)
NAME:  Natalie [name], MRN:  [id number], DOB:  1933-11-25, LOS: 0 ADMISSION DATE:  06/03/2019, CONSULTATION DATE:  05/15/2019 REFERRING MD:  Dr Erlinda Hong. , CHIEF COMPLAINT:  R middle cerebral artery occlusion   Brief History   85 y.o. F who developed L hemiplegia and slurred speech, last known well 1505, found to have R middle cerebral artery occlusion who received TPA and underwent mechanical thrombectomy.  Following the procedure pt was not following commands, so was left intubated for airway protection. PCCM consulted for ventilator management.   History of present illness   Ms. Natalie [name] is a 85 y.o. F with  CAD s/p stenting, colon cancer and resection, TIA, HTN, HL who was watching TV this afternoon and noted to have sudden onset L facial droop, slurred speech and R gaze deviation. Brought in by EMS and CT head was negative, CTA with occlusion of M2/3.  NIHSS 17; she was given TPA and taken to IR for thrombectomy.  Was restless and not following commands post-procedure, so PCCM consulted for vent management.  Past Medical History   has a past medical history of Back pain, CAD (coronary artery disease), Colon cancer ([place]), GERD (gastroesophageal reflux disease), Glaucoma suspect, HTN (hypertension), Hyperlipidemia, Shingles (11/05/202018), TIA (transient ischemic attack), and UTI (urinary tract infection).   Significant Hospital Events   11/29 Admit to Neurology, given TPA and underwent mechanical thrombectomy  Consults:  PCCM  Procedures:  11/29 Mechanical Thrombectomy M2/M3 11/29 ETT  Significant Diagnostic Tests:  11/29 CT head>>no acute findings 11/29 CTA>>Occlusion of 2 right MCA branches at the M2/3 junction.  Micro Data:  11/29 Sars-CoV-2>>negative  Antimicrobials:    Interim history/subjective:    Objective   Blood pressure (!) 171/95, pulse 100, resp. rate 18, height 5\' 5"  (1.651 m), weight 53.3 kg, SpO2 100 %.    Vent Mode: PRVC FiO2 (%):  [100 %] 100 % Set  Rate:  [14 bmp] 14 bmp Vt Set:  [450 mL] 450 mL PEEP:  [5 cmH20] 5 cmH20 Plateau Pressure:  [12 cmH20] 12 cmH20   Intake/Output Summary (Last 24 hours) at 06/05/2019 1927 Last data filed at 05/16/2019 [id number] Gross per 24 hour  Intake 1000 ml  Output 75 ml  Net 925 ml   Filed Weights   06/09/19 1600  Weight: 53.3 kg   General:  Thin, elderly F, intubated and restless HEENT: MM pink/moist Neuro: actively moving bilateral lower extremities, no movement of the L arm, no noted facial droop CV: s1s2, rrr, no m/r/g PULM:  Clear bilaterally, on full vent support GI: soft, bsx4 active  Extremities: warm/dry, no edema  Skin: no rashes or lesions   Resolved Hospital Problem list     Assessment & Plan:   Middle cerebral artery occlusion s/p mechanical thrombectomy requiring post-op airway protection -stroke management per neurology, MRA/MRI 24 hrs post TPA -no anti-platelet therapy first 24hrs -Echocardiogram pending -Close neuro monitoring, stat head CT for acute changes -NPO -pH 7.31, CO2 55, increased RR from 14 to 16, repeat ABG in the AM -Maintain full vent support with SAT/SBT as tolerated, restless on Propofol, Fentanyl gtt added -titrate Vent setting to maintain SpO2 greater than or equal to 90%. -HOB elevated 30 degrees. -Plateau pressures less than 30 cm H20.  -Follow chest x-ray   -Bronchial hygiene and RT/bronchodilator protocol.   Best practice:  Diet: NPO Pain/Anxiety/Delirium protocol (if indicated):  VAP protocol (if indicated): yes DVT prophylaxis: SCD GI prophylaxis: protonix Glucose control: SSI Mobility: bed rest Code Status:  Full Family Communication: per primary Disposition: ICU  Labs   CBC: Recent Labs  Lab 05/12/2019 1542 05/12/2019 1548  WBC 10.3  --   NEUTROABS 6.1  --   HGB 11.6* 12.6  HCT 36.8 37.0  MCV 95.6  --   PLT 531*  --     Basic Metabolic Panel: Recent Labs  Lab 05/25/2019 1542 06/06/2019 1548  NA 143 142  K 4.1 4.1  CL 104  104  CO2 25  --   GLUCOSE 111* 109*  BUN 15 17  CREATININE 0.96 0.90  CALCIUM 9.7  --    GFR: Estimated Creatinine Clearance: 39.9 mL/min (by C-G formula based on SCr of 0.9 mg/dL). Recent Labs  Lab 05/18/2019 1542  WBC 10.3    Liver Function Tests: Recent Labs  Lab 05/12/2019 1542  AST 18  ALT 11  ALKPHOS 39  BILITOT 0.5  PROT 7.5  ALBUMIN 3.9   No results for input(s): LIPASE, AMYLASE in the last 168 hours. No results for input(s): AMMONIA in the last 168 hours.  ABG    Component Value Date/Time   HCO3 27.2 (H) 04/17/2007 1922   TCO2 29 05/29/2019 1548     Coagulation Profile: Recent Labs  Lab 05/14/2019 1542  INR 1.0    Cardiac Enzymes: No results for input(s): CKTOTAL, CKMB, CKMBINDEX, TROPONINI in the last 168 hours.  HbA1C: Hemoglobin A1C  Date/Time Value Ref Range Status  06/14/2016 01:40 PM 5.6  Final   Hgb A1c MFr Bld  Date/Time Value Ref Range Status  05/08/2013 06:01 AM 6.0 (H) <5.7 % Final    Comment:    (NOTE)                                                                       According to the ADA Clinical Practice Recommendations for 2011, when HbA1c is used as a screening test:  >=6.5%   Diagnostic of Diabetes Mellitus           (if abnormal result is confirmed) 5.7-6.4%   Increased risk of developing Diabetes Mellitus References:Diagnosis and Classification of Diabetes Mellitus,Diabetes [id number] 1):S62-S69 and Standards of Medical Care in         Diabetes - 2011,Diabetes [id number] (Suppl 1):S11-S61.    CBG: Recent Labs  Lab 05/16/2019 1541  GLUCAP 98    Review of Systems:   Unable to obtain, intubated and sedated  Past Medical History  She,  has a past medical history of Back pain, CAD (coronary artery disease), Colon cancer ([place]atalie [name]), GERD (gastroesophageal reflux disease), Glaucoma suspect, HTN (hypertension), Hyperlipidemia, Shingles (06/08/2017), TIA (transient ischemic attack), and UTI (urinary tract infection).    Surgical History    Past Surgical History:  Procedure Laterality Date  . ABDOMINAL HYSTERECTOMY    . BACK SURGERY    . CORONARY STENT PLACEMENT  Jan 2007   DES to RCA  . Microdiscectomy     L4-L5     Social History   reports that she quit smoking about 25 years ago. Her smoking use included cigarettes. She started smoking about 63 years ago. She has a 19.00 pack-year smoking history. She has never used smokeless tobacco. She reports that she does not drink alcohol or use drugs.  Family History   Her family history includes Diabetes in her brother; Healthy in her son; Stroke in her father.   Allergies Allergies  Allergen Reactions  . Claritin [Loratadine] Other (See Comments)    Headaches, Sinus pain, Swelling of the sinuses  . Crestor [Rosuvastatin Calcium] Other (See Comments)    Muscle aches and pains   . Lipitor [Atorvastatin] Other (See Comments)    Muscle aches on 40 mg daily  . Oxycontin [Oxycodone Hcl] Nausea And Vomiting  . Gabapentin   . Penicillins Rash     Home Medications  Prior to Admission medications   Medication Sig Start Date End Date Taking? Authorizing Provider  acetaminophen (TYLENOL) 500 MG tablet Take 1,500 mg by mouth 3 (three) times daily as needed for moderate pain.    [provider]  amitriptyline (ELAVIL) 10 MG tablet Take 1 tablet (10 mg total) by mouth at bedtime. 12/08/17   Mikell, Jeani Sow, MD  amLODipine (NORVASC) 5 MG tablet Take 1 tablet (5 mg total) by mouth daily. Please make overdue appt with Dr. Meda Coffee before anymore refills. 3rd attempt 04/18/19   Dorothy Spark, MD  aspirin EC 81 MG tablet Take 81 mg by mouth daily.    [provider]  capsicum oleoresin (TRIXAICIN) 0.025 % cream Apply 1 application topically 3 (three) times daily. 07/20/17   Steve Rattler, DO  clopidogrel (PLAVIX) 75 MG tablet Take 1 tablet (75 mg total) by mouth daily. Please make overdue appt with Dr. Meda Coffee before anymore refills. 3rd  Attempt 04/18/19   Dorothy Spark, MD  diclofenac sodium (VOLTAREN) 1 % GEL APPLY 2 GRAMS TO AFFECTED AREA 4 TIMES A DAY Patient taking differently: Apply 2 g topically 4 (four) times daily.  06/05/18   Kinnie Feil, MD  lidocaine (XYLOCAINE) 5 % ointment Apply 1 application topically as needed. 03/25/19   Benay Pike, MD  metoprolol tartrate (LOPRESSOR) 50 MG tablet Take 1 tablet (50 mg total) by mouth 2 (two) times daily. Please keep upcoming appt in February with Dr. Meda Coffee before anymore refills. Thank you 05/23/19   Dorothy Spark, MD  nitroGLYCERIN (NITROSTAT) 0.4 MG SL tablet Place 1 tablet (0.4 mg total) under the tongue every 5 (five) minutes as needed for chest pain. 10/02/15   Dorothy Spark, MD  umeclidinium-vilanterol (ANORO ELLIPTA) 62.5-25 MCG/INH AEPB Inhale 1 puff into the lungs daily. 09/30/16   Zenia Resides, MD     Critical care time: 50 minutes   CRITICAL CARE Performed by: Otilio Carpen Gleason   Total critical care time: 50 minutes  Critical care time was exclusive of separately billable procedures and treating other patients.  Critical care was necessary to treat or prevent imminent or life-threatening deterioration.  Critical care was time spent personally by me on the following activities: development of treatment plan with patient and/or surrogate as well as nursing, discussions with consultants, evaluation of patient's response to treatment, examination of patient, obtaining history from patient or surrogate, ordering and performing treatments and interventions, ordering and review of laboratory studies, ordering and review of radiographic studies, pulse oximetry and re-evaluation of patient's condition.   Otilio Carpen Gleason, PA-C [facility]  Pager# [phone], if no answer [phone]  Patient seen, examined, chart reviewed.  Assessment, and critical care plan discussed with APP Gleason and agree with above consult being reflective of our discussion.

## 2019-06-09 NOTE — Procedures (Signed)
S/P RT Common carotid arteriogram ffollowed by complete revascularization of sup division M2/3 and INf division M2/M3 segs  With x 1 pass with aspiration in the sup division and 3mg  of IA tpa in the sup division achieving a TICI 2 C revascularization S.Jacy Brocker MD

## 2019-06-10 ENCOUNTER — Inpatient Hospital Stay (HOSPITAL_COMMUNITY): Payer: Medicare Other

## 2019-06-10 ENCOUNTER — Encounter (HOSPITAL_COMMUNITY): Payer: Self-pay | Admitting: Interventional Radiology

## 2019-06-10 DIAGNOSIS — J988 Other specified respiratory disorders: Secondary | ICD-10-CM | POA: Diagnosis not present

## 2019-06-10 DIAGNOSIS — I10 Essential (primary) hypertension: Secondary | ICD-10-CM

## 2019-06-10 DIAGNOSIS — I639 Cerebral infarction, unspecified: Secondary | ICD-10-CM

## 2019-06-10 DIAGNOSIS — I6601 Occlusion and stenosis of right middle cerebral artery: Secondary | ICD-10-CM | POA: Diagnosis not present

## 2019-06-10 DIAGNOSIS — Z978 Presence of other specified devices: Secondary | ICD-10-CM

## 2019-06-10 DIAGNOSIS — E785 Hyperlipidemia, unspecified: Secondary | ICD-10-CM

## 2019-06-10 LAB — BASIC METABOLIC PANEL WITH GFR
Anion gap: 9 (ref 5–15)
BUN: 14 mg/dL (ref 8–23)
CO2: 23 mmol/L (ref 22–32)
Calcium: 8.3 mg/dL — ABNORMAL LOW (ref 8.9–10.3)
Chloride: 106 mmol/L (ref 98–111)
Creatinine, Ser: 0.72 mg/dL (ref 0.44–1.00)
GFR calc Af Amer: >60 mL/min
GFR calc non Af Amer: >60 mL/min
Glucose, Bld: 160 mg/dL — ABNORMAL HIGH (ref 70–99)
Potassium: 3.7 mmol/L (ref 3.5–5.1)
Sodium: 138 mmol/L (ref 135–145)

## 2019-06-10 LAB — PHOSPHORUS: Phosphorus: 4.2 mg/dL (ref 2.5–4.6)

## 2019-06-10 LAB — POCT I-STAT 7, (LYTES, BLD GAS, ICA,H+H)
Bicarbonate: 25.1 mmol/L (ref 20.0–28.0)
Calcium, Ion: 1.2 mmol/L (ref 1.15–1.40)
HCT: 33 % — ABNORMAL LOW (ref 36.0–46.0)
Hemoglobin: 11.2 g/dL — ABNORMAL LOW (ref 12.0–15.0)
O2 Saturation: 97 %
Potassium: 3.8 mmol/L (ref 3.5–5.1)
Sodium: 138 mmol/L (ref 135–145)
TCO2: 26 mmol/L (ref 22–32)
pCO2 arterial: 43.9 mmHg (ref 32.0–48.0)
pH, Arterial: 7.366 (ref 7.350–7.450)
pO2, Arterial: 98 mmHg (ref 83.0–108.0)

## 2019-06-10 LAB — CBC WITH DIFFERENTIAL/PLATELET
Abs Immature Granulocytes: 0.05 K/uL (ref 0.00–0.07)
Basophils Absolute: 0 K/uL (ref 0.0–0.1)
Basophils Relative: 0 %
Eosinophils Absolute: 0 K/uL (ref 0.0–0.5)
Eosinophils Relative: 0 %
HCT: 31.2 % — ABNORMAL LOW (ref 36.0–46.0)
Hemoglobin: 10.2 g/dL — ABNORMAL LOW (ref 12.0–15.0)
Immature Granulocytes: 0 %
Lymphocytes Relative: 3 %
Lymphs Abs: 0.3 K/uL — ABNORMAL LOW (ref 0.7–4.0)
MCH: 30.6 pg (ref 26.0–34.0)
MCHC: 32.7 g/dL (ref 30.0–36.0)
MCV: 93.7 fL (ref 80.0–100.0)
Monocytes Absolute: 0.3 K/uL (ref 0.1–1.0)
Monocytes Relative: 3 %
Neutro Abs: 11 K/uL — ABNORMAL HIGH (ref 1.7–7.7)
Neutrophils Relative %: 94 %
Platelets: 421 K/uL — ABNORMAL HIGH (ref 150–400)
RBC: 3.33 MIL/uL — ABNORMAL LOW (ref 3.87–5.11)
RDW: 13.4 % (ref 11.5–15.5)
WBC: 11.7 K/uL — ABNORMAL HIGH (ref 4.0–10.5)
nRBC: 0 % (ref 0.0–0.2)

## 2019-06-10 LAB — LIPID PANEL
Cholesterol: 195 mg/dL (ref 0–200)
HDL: 63 mg/dL (ref >40)
LDL Cholesterol: 114 mg/dL — ABNORMAL HIGH (ref 0–99)
Total CHOL/HDL Ratio: 3.1 RATIO
Triglycerides: 92 mg/dL (ref <150)
VLDL: 18 mg/dL (ref 0–40)

## 2019-06-10 LAB — GLUCOSE, CAPILLARY
Glucose-Capillary: 167 mg/dL — ABNORMAL HIGH (ref 70–99)
Glucose-Capillary: 172 mg/dL — ABNORMAL HIGH (ref 70–99)

## 2019-06-10 LAB — MAGNESIUM: Magnesium: 1.3 mg/dL — ABNORMAL LOW (ref 1.7–2.4)

## 2019-06-10 LAB — MRSA PCR SCREENING: MRSA by PCR: NEGATIVE

## 2019-06-10 LAB — HEMOGLOBIN A1C
Hgb A1c MFr Bld: 5.6 % (ref 4.8–5.6)
Mean Plasma Glucose: 114.02 mg/dL

## 2019-06-10 MED ORDER — CHLORHEXIDINE GLUCONATE 0.12% ORAL RINSE (MEDLINE KIT)
15.0000 mL | Freq: Two times a day (BID) | OROMUCOSAL | Status: DC
  Administered 2019-06-10 – 2019-06-11 (×4): 15 mL via OROMUCOSAL

## 2019-06-10 MED ORDER — VITAL AF 1.2 CAL PO LIQD: 1000.0000 mL | ORAL | Status: DC

## 2019-06-10 MED ORDER — CLEVIDIPINE BUTYRATE 0.5 MG/ML IV EMUL
0.0000 mg/h | INTRAVENOUS | Status: DC
  Filled 2019-06-10: qty 50

## 2019-06-10 MED ORDER — METOPROLOL TARTRATE 5 MG/5ML IV SOLN: 2.5000 mg | INTRAVENOUS | Status: DC | PRN

## 2019-06-10 MED ORDER — ORAL CARE MOUTH RINSE
15.0000 mL | OROMUCOSAL | Status: DC
  Administered 2019-06-10 – 2019-06-11 (×14): 15 mL via OROMUCOSAL

## 2019-06-10 MED ORDER — CLEVIDIPINE BUTYRATE 0.5 MG/ML IV EMUL: 0.0000 mg/h | INTRAVENOUS | Status: DC

## 2019-06-10 NOTE — Progress Notes (Signed)
Initial Nutrition Assessment  DOCUMENTATION CODES:   Not applicable  INTERVENTION:   Initiate Vital AF 1.2 @ 45 ml/hr (1080 ml/day)  Provides: 1296 kcal, 81 grams protein, and 875 ml free water.    NUTRITION DIAGNOSIS:   Inadequate oral intake related to inability to eat as evidenced by NPO status.  GOAL:   Patient will meet greater than or equal to 90% of their needs  MONITOR:   Vent status, TF tolerance  REASON FOR ASSESSMENT:   Consult, Ventilator Enteral/tube feeding initiation and management  ASSESSMENT:   Pt with PMH of CAD s/p stenting, colon cancer s/p resection, TIA, and HTN admitted with R MCA occlusion s/p tPA and mechanical thrombectomy.    Patient is currently intubated on ventilator support MV: 7 L/min Temp (24hrs), Avg:98.1 F (36.7 C), Min:97 F (36.1 C), Max:98.7 F (37.1 C)  Medications: fentanyl   Labs reviewed    NUTRITION - FOCUSED PHYSICAL EXAM:  Deferred   Diet Order:   Diet Order            Diet NPO time specified  Diet effective now              EDUCATION NEEDS:   No education needs have been identified at this time  Skin:  Skin Assessment: Reviewed RN Assessment  Last BM:  unknown  Height:   Ht Readings from Last 1 Encounters:  05/28/2019 5\' 5"  (1.651 m)    Weight:   Wt Readings from Last 1 Encounters:  05/28/2019 53.3 kg    Ideal Body Weight:  56.8 kg  BMI:  Body mass index is 19.55 kg/m.  Estimated Nutritional Needs:   Kcal:  1200  Protein:  70-85 grams  Fluid:  > 1.5 L/day  Maylon Peppers RD, LDN, CNSC [phone] Pager [phone] After Hours Pager

## 2019-06-10 NOTE — Progress Notes (Signed)
OT Cancellation Note  Patient Details Name: Glendon Lowdermilk MRN: [id number] DOB: 01-07-1934   Cancelled Treatment:    Reason Eval/Treat Not Completed: Patient not medically ready.  Pt remains intubated.  Will monitor for readiness.   Lucille Passy, OTR/L Acute Rehabilitation Services Pager [phone] Office [phone]   Lucille Passy M 06/10/2019, 10:33 AM

## 2019-06-10 NOTE — Progress Notes (Signed)
eLink Physician-Brief Progress Note Patient Name: Natalie [name] DOB: 18-Feb-1934 MRN: [id number]   Date of Service  06/10/2019  HPI/Events of Note  Patient has Foley catheter post IR procedure, however, no order for Foley catheter.   eICU Interventions  Will order: 1. Place Foley catheter.      Intervention Category Major Interventions: Other:  Sommer,Steven Cornelia Copa 06/10/2019, 2:55 AM

## 2019-06-10 NOTE — Progress Notes (Signed)
8Fr Rt femoral sheath removed at 0928 am using manual pressure and quickclot pad. Upon removal, a kink was noted in the sheath.  Manual pressure held for 20 min Distal pulses in tact with no groin complications. Site reviewed with Comptroller.

## 2019-06-10 NOTE — Plan of Care (Signed)
MRI and MRA reviewed. MRI showed right MCA moderate stroke with large left ACA and MCA infarcts involving entire left anterior circulation, with up to 12mm MLS. MRA showed left ICA slow flow and left M2 and A1 occlusion. Etiology unclear. Discussed with Dr. Estanislado Pandy concerning recurrent emboli, however, procedure related event can not be excluded.   Discussed with daughter at bedside, showed her images and informed her pt poor prognosis. Pt stroke severe enough that likely nonsurvivable, comfort care measures offered, and no escalation care at this time. Daughter will go home and discuss with her father and sister.   I later got call from family asking for family meeting 1100 am tomorrow.   Rosalin Hawking, MD PhD Stroke Neurology 06/10/2019 10:33 PM

## 2019-06-10 NOTE — Progress Notes (Signed)
STROKE TEAM PROGRESS NOTE   INTERVAL HISTORY Pt still intubated on vent. On low dose sedation. Just pulled off sheath this am. Not open eyes to pain or voice, not following commands. Spontaneous movement of LLE but LUE still flaccid. Pending MRI and MRA.   Vitals:   06/10/19 0737 06/10/19 0800 06/10/19 0829 06/10/19 0830  BP:   (!) 117/53   Pulse:  (!) 112  (!) 106  Resp:  16  16  Temp: 98.4 F (36.9 C)     TempSrc: Axillary     SpO2:    100%  Weight:      Height:        CBC:  Recent Labs  Lab 05/29/2019 1542  06/10/19 0340 06/10/19 0500  WBC 10.3  --   --  11.7*  NEUTROABS 6.1  --   --  11.0*  HGB 11.6*   < > 11.2* 10.2*  HCT 36.8   < > 33.0* 31.2*  MCV 95.6  --   --  93.7  PLT 531*  --   --  421*   < > = values in this interval not displayed.    Basic Metabolic Panel:  Recent Labs  Lab 06/02/2019 1542 06/08/2019 1548  06/10/19 0340 06/10/19 0500  NA 143 142   < > 138 138  K 4.1 4.1   < > 3.8 3.7  CL 104 104  --   --  106  CO2 25  --   --   --  23  GLUCOSE 111* 109*  --   --  160*  BUN 15 17  --   --  14  CREATININE 0.96 0.90  --   --  0.72  CALCIUM 9.7  --   --   --  8.3*   < > = values in this interval not displayed.   Lipid Panel:     Component Value Date/Time   CHOL 195 06/10/2019 0500   CHOL 235 (H) 02/13/2018 1447   TRIG 92 06/10/2019 0500   HDL 63 06/10/2019 0500   HDL 62 02/13/2018 1447   CHOLHDL 3.1 06/10/2019 0500   VLDL 18 06/10/2019 0500   LDLCALC 114 (H) 06/10/2019 0500   LDLCALC 134 (H) 02/13/2018 1447   HgbA1c:  Lab Results  Component Value Date   HGBA1C 5.6 06/10/2019   Urine Drug Screen:     Component Value Date/Time   LABOPIA NONE DETECTED 05/08/2013 0215   COCAINSCRNUR NONE DETECTED 05/08/2013 0215   LABBENZ NONE DETECTED 05/08/2013 0215   AMPHETMU NONE DETECTED 05/08/2013 0215   THCU NONE DETECTED 05/08/2013 0215   LABBARB NONE DETECTED 05/08/2013 0215    Alcohol Level No results found for: ETH  IMAGING Ct Code Stroke Cta  Head W/wo Contrast  Result Date: 05/28/2019 CLINICAL DATA:  Left-sided weakness EXAM: CT ANGIOGRAPHY HEAD AND NECK TECHNIQUE: Multidetector CT imaging of the head and neck was performed using the standard protocol during bolus administration of intravenous contrast. Multiplanar CT image reconstructions and MIPs were obtained to evaluate the vascular anatomy. Carotid stenosis measurements (when applicable) are obtained utilizing NASCET criteria, using the distal internal carotid diameter as the denominator. CONTRAST:  166mL OMNIPAQUE IOHEXOL 350 MG/ML SOLN COMPARISON:  Noncontrast head CT earlier today FINDINGS: CTA NECK FINDINGS Aortic arch: Atherosclerotic plaque with 3 vessel branching. Plaque along the distal arch is irregular. Right carotid system: Atheromatous wall thickening of the common carotid. Moderate calcified plaque at the ICA bulb without 50% or greater stenosis. No  ulceration. Left carotid system: Calcified plaque at the common carotid origin without flow limiting stenosis. Moderate mixed density plaque at the proximal ICA with 50% ICA stenosis. Negative for dissection. Vertebral arteries: Subclavian atherosclerosis on both sides without flow limiting stenosis. The vertebral arteries are symmetric and widely patent to the dura. Skeleton: Diffuse disc and facet degeneration. No acute or aggressive finding Other neck: No acute finding Upper chest: No acute finding.  Emphysema. Review of the MIP images confirms the above findings CTA HEAD FINDINGS Anterior circulation: Cut off of 2 right M2 branches at the level of the M2 3 junction. There is poor collateral flow with oligemic appearance on multiple slices over the posterior right frontal and parietal lobes. No left-sided branch occlusion. There is plaque at both carotid siphons without flow limiting stenosis. Apparent outpouching from the left paraclinoid ICA segment is likely calcified plaque based on reformats. No convincing aneurysm. Posterior  circulation: Atherosclerotic plaque on the right V4 segment. Diffusely patent vertebral and basilar arteries. No branch occlusion, beading, or aneurysm. Venous sinuses: Unremarkable in the arterial phase Anatomic variants: None significant These results were called by telephone at the time of interpretation on 05/14/2019 at 4:36 pm to Dr Erlinda Hong , who was already aware of the findings. Review of the MIP images confirms the above findings IMPRESSION: 1. Occlusion of 2 right MCA branches at the M2/3 junction. No flow limiting stenosis or embolic source in the more proximal right carotid circulation. Paucity of filling vessels along the affected right cerebral convexity-subjectively poor collateral flow. 2. 50% atheromatous narrowing at the proximal left ICA. Electronically Signed   By: Monte Fantasia M.D.   On: 05/29/2019 16:47   Ct Code Stroke Cta Neck W/wo Contrast  Result Date: 06/01/2019 CLINICAL DATA:  Left-sided weakness EXAM: CT ANGIOGRAPHY HEAD AND NECK TECHNIQUE: Multidetector CT imaging of the head and neck was performed using the standard protocol during bolus administration of intravenous contrast. Multiplanar CT image reconstructions and MIPs were obtained to evaluate the vascular anatomy. Carotid stenosis measurements (when applicable) are obtained utilizing NASCET criteria, using the distal internal carotid diameter as the denominator. CONTRAST:  132mL OMNIPAQUE IOHEXOL 350 MG/ML SOLN COMPARISON:  Noncontrast head CT earlier today FINDINGS: CTA NECK FINDINGS Aortic arch: Atherosclerotic plaque with 3 vessel branching. Plaque along the distal arch is irregular. Right carotid system: Atheromatous wall thickening of the common carotid. Moderate calcified plaque at the ICA bulb without 50% or greater stenosis. No ulceration. Left carotid system: Calcified plaque at the common carotid origin without flow limiting stenosis. Moderate mixed density plaque at the proximal ICA with 50% ICA stenosis. Negative for  dissection. Vertebral arteries: Subclavian atherosclerosis on both sides without flow limiting stenosis. The vertebral arteries are symmetric and widely patent to the dura. Skeleton: Diffuse disc and facet degeneration. No acute or aggressive finding Other neck: No acute finding Upper chest: No acute finding.  Emphysema. Review of the MIP images confirms the above findings CTA HEAD FINDINGS Anterior circulation: Cut off of 2 right M2 branches at the level of the M2 3 junction. There is poor collateral flow with oligemic appearance on multiple slices over the posterior right frontal and parietal lobes. No left-sided branch occlusion. There is plaque at both carotid siphons without flow limiting stenosis. Apparent outpouching from the left paraclinoid ICA segment is likely calcified plaque based on reformats. No convincing aneurysm. Posterior circulation: Atherosclerotic plaque on the right V4 segment. Diffusely patent vertebral and basilar arteries. No branch occlusion, beading, or aneurysm. Venous  sinuses: Unremarkable in the arterial phase Anatomic variants: None significant These results were called by telephone at the time of interpretation on 05/31/2019 at 4:36 pm to Dr Erlinda Hong , who was already aware of the findings. Review of the MIP images confirms the above findings IMPRESSION: 1. Occlusion of 2 right MCA branches at the M2/3 junction. No flow limiting stenosis or embolic source in the more proximal right carotid circulation. Paucity of filling vessels along the affected right cerebral convexity-subjectively poor collateral flow. 2. 50% atheromatous narrowing at the proximal left ICA. Electronically Signed   By: Monte Fantasia M.D.   On: 06/07/2019 16:47   Ct Code Stroke Cerebral Perfusion With Contrast  Result Date: 05/30/2019 CLINICAL DATA:  Left-sided weakness EXAM: CT PERFUSION BRAIN TECHNIQUE: Multiphase CT imaging of the brain was performed following IV bolus contrast injection. Subsequent parametric  perfusion maps were calculated using RAPID software. CONTRAST:  123mL OMNIPAQUE IOHEXOL 350 MG/ML SOLN COMPARISON:  Noncontrast head CT earlier today FINDINGS: Attempted but unsuccessful CT perfusion due to patient motion. No additional findings are seen. IMPRESSION: Attempted but failed and nondiagnostic CT perfusion due to patient motion. Electronically Signed   By: Monte Fantasia M.D.   On: 05/27/2019 16:36   Dg Chest Port 1 View  Result Date: 06/10/2019 CLINICAL DATA:  Intubation.  Stroke. EXAM: PORTABLE CHEST 1 VIEW COMPARISON:  09/28/2016, 05/26/2009. FINDINGS: Endotracheal tube tip noted 6 cm above the carina. Heart size normal. Left base subsegmental atelectasis and or scarring again noted. Slight increase interstitial markings noted bilaterally. Interstitial edema and/or pneumonitis cannot be excluded. Slight elevation left hemidiaphragm. Small left pleural effusion. Right costophrenic angle not completely imaged. No pneumothorax. IMPRESSION: 1.  Endotracheal tube tip noted 6 cm above the carina. 2. Left base subsegmental atelectasis and or scarring again noted. Slight increase interstitial markings noted bilaterally. Interstitial edema and/or pneumonitis cannot be excluded. 3. Slight elevation left hemidiaphragm. Small left pleural effusion. Electronically Signed   By: Marcello Moores  Register   On: 06/10/2019 07:20   Ct Head Code Stroke Wo Contrast  Result Date: 06/08/2019 CLINICAL DATA:  Code stroke.  Left-sided paralysis and facial droop EXAM: CT HEAD WITHOUT CONTRAST TECHNIQUE: Contiguous axial images were obtained from the base of the skull through the vertex without intravenous contrast. COMPARISON:  None. FINDINGS: Brain: No evidence of acute infarction, hemorrhage, hydrocephalus, extra-axial collection or mass lesion/mass effect. Vascular: No hyperdense vessel or unexpected calcification. Skull: Normal. Negative for fracture or focal lesion. Sinuses/Orbits: No acute finding. Other: These results  were communicated to Natalie [name] at 3:55 pmon 11/09/2020by text page via the [facility] messaging system. ASPECTS [facility] Stroke Program Early CT Score) - Ganglionic level infarction (caudate, lentiform nuclei, internal capsule, insula, M1-M3 cortex): 7 - Supraganglionic infarction (M4-M6 cortex): 3 Total score (0-10 with 10 being normal): 10 IMPRESSION: No acute finding. ASPECTS is 10. Electronically Signed   By: Monte Fantasia M.D.   On: 06/09/2019 15:57   Cerebral angio S/P RT Common carotid arteriogram ffollowed by complete revascularization of sup division M2/3 and INf division M2/M3 segs  With x 1 pass with aspiration in the sup division and 3mg  of IA tpa in the sup division achieving a TICI 2 C revascularization  PHYSICAL EXAM  Temp:  [97 F (36.1 C)-98.7 F (37.1 C)] 98.4 F (36.9 C) (11/30 0737) Pulse Rate:  [37-112] 106 (11/30 0830) Resp:  [14-21] 16 (11/30 0830) BP: (88-191)/(53-132) 117/53 (11/30 0829) SpO2:  [89 %-100 %] 100 % (11/30 0830) Arterial Line BP: (118)/(53) 118/53 (  11/30 0830) FiO2 (%):  [40 %-100 %] 40 % (11/30 0829) Weight:  [53.3 kg] 53.3 kg (11/29 1600)  General - Well nourished, well developed, intubated on low dose sedation.  Ophthalmologic - fundi not visualized due to noncooperation.  Cardiovascular - Regular rhythm but tachycardia.  Neuro - intubated on low dose sedation, eyes closed, not following commands. With forced eye opening, eyes in mid position, not blinking to visual threat, doll's eyes sluggish, not tracking, PERRL. Corneal reflex weak bilaterally, gag and cough weak reflex. Breathing over the vent.  Facial symmetry not able to test due to ET tube.  Tongue protrusion not cooperative. On pain stimulation, withdraw BLE mildly, however, no movement of BUEs. DTR 1+ and positive babinski on the left. Sensation, coordination and gait not tested.   ASSESSMENT/PLAN Natalie [name] is a 85 y.o. female with history of TIA 04/2013, HTN, HLD, colon cancer  s/p resection 2000, CAD s/p stent, PAD presenting with slurred speech and left sided weakness.   Stroke: R MCA infarct due to right M2/M3 occlusion s/p IV tPA and IR w/ IA tPA, embolic secondary to unknown source  Code Stroke CT head No acute abnormality. ASPECTS 10    CTA head & neck occlusion of 2 R MCA branches at M2/3 junction. Proximal L ICA 50% stenosis.  Cerebral angio occlusion of superior and inferior division R M2/3 segments w/ TICI2c revascularization w/ mechanical thrombectomy and IA tPA   MRI  pending  MRA  pending  2D Echo pending   LDL 114  HgbA1c 5.6  SCDs for VTE prophylaxis  aspirin 81 mg daily and clopidogrel 75 mg daily prior to admission, now on No antithrombotic as within 24h post tPA. Plan resume aspirin if 24h imaging neg for hemorrhage.  Therapy recommendations:  pending   Disposition:  pending   Acute Respiratory Failure  Intubated for IR.   Left intubated post IR for not following commands and for airway protection  CCM onboard  Wean off sedation as able  Hypertension  Home meds:  norvasc 5, lopressor 50 bid  BP on the high side 170-180 BP goal per post tPA protocol x 24h following tPA administration; BP goal per IR x 24h post IR . Long-term BP goal normotensive  Hyperlipidemia  Home meds:  No statin  LDL 114, goal < 70  Intolerant to statins  Dysphagia . Secondary to stroke . NPO . Speech on board  Other Stroke Risk Factors  Advanced age  Former Cigarette smoker, quit 25 yrs ago  Hx stroke/TIA  04/2013 - TIA w/ sx arm "heaviness" and numbness   Family hx stroke (father)  Coronary artery disease s/p DES in 2007  PAD  Other Active Problems  Colon cancer s/p chemo & resection  GERD  Hospital day # 1  This patient is critically ill due to right MCA infarct, s/p tPA, IA tPA and MT, HTN and at significant risk of neurological worsening, death form recurrent stroke, hemorrhagic conversion, seizure, cerebral edema.  This patient's care requires constant monitoring of vital signs, hemodynamics, respiratory and cardiac monitoring, review of multiple databases, neurological assessment, discussion with family, other specialists and medical decision making of high complexity. I spent 35 minutes of neurocritical care time in the care of this patient.  Rosalin Hawking, MD PhD Stroke Neurology 06/10/2019 10:27 PM   To contact Stroke Continuity provider, please refer to [email or web address] After hours, contact General Neurology

## 2019-06-10 NOTE — Progress Notes (Signed)
NAME:  Natalie [name], MRN:  [id number], DOB:  1933-12-11, LOS: 1 ADMISSION DATE:  05/17/2019, CONSULTATION DATE:  06/10/19 REFERRING MD:  Dr Erlinda Hong. , CHIEF COMPLAINT:  R middle cerebral artery occlusion   Brief History   85 y.o. F who developed L hemiplegia and slurred speech, last known well 1505, found to have R middle cerebral artery occlusion who received TPA and underwent mechanical thrombectomy.  Following the procedure pt was not following commands, so was left intubated for airway protection. PCCM consulted for ventilator management.   History of present illness   Natalie [name] is a 85 y.o. F with  CAD s/p stenting, colon cancer and resection, TIA, HTN, HL who was watching TV this afternoon and noted to have sudden onset L facial droop, slurred speech and R gaze deviation. Brought in by EMS and CT head was negative, CTA with occlusion of M2/3.  NIHSS 17; she was given TPA and taken to IR for thrombectomy.  Was restless and not following commands post-procedure, so PCCM consulted for vent management.  Past Medical History   has a past medical history of Back pain, CAD (coronary artery disease), Colon cancer ([place]), GERD (gastroesophageal reflux disease), Glaucoma suspect, HTN (hypertension), Hyperlipidemia, Shingles (11/17/202018), TIA (transient ischemic attack), and UTI (urinary tract infection).  Significant Hospital Events   11/29 Admit to Neurology, given TPA and underwent mechanical thrombectomy.  Consults:  PCCM  Procedures:  11/29 Mechanical Thrombectomy M2/M3 11/29 ETT  Significant Diagnostic Tests:  11/29 CT head>>no acute findings 11/29 CTA>>Occlusion of 2 right MCA branches at the M2/3 junction.  Micro Data:  11/29 Sars-CoV-2>>negative  Antimicrobials:    Interim history/subjective:  No acute events.  Sedated, comfortable. Intermittent sinus tach to 130s but not sustained.  Objective   Blood pressure (!) 170/79, pulse (!) 102, temperature 98.4 F (36.9  C), temperature source Axillary, resp. rate 16, height 5\' 5"  (1.651 m), weight 53.3 kg, SpO2 100 %.    Vent Mode: PRVC FiO2 (%):  [40 %-100 %] 40 % Set Rate:  [14 bmp-16 bmp] 16 bmp Vt Set:  [450 mL] 450 mL PEEP:  [5 cmH20] 5 cmH20 Plateau Pressure:  [12 cmH20] 12 cmH20   Intake/Output Summary (Last 24 hours) at 06/10/2019 0806 Last data filed at 05/20/2019 1915 Gross per 24 hour  Intake 1000 ml  Output 75 ml  Net 925 ml   Filed Weights   06/09/19 1600  Weight: 53.3 kg   Physical Exam: General: Thin adult female, resting in bed, in NAD. Neuro: Sedated, does not follow any commands. HEENT: [place]/AT. Sclerae anicteric. ETT in place. Cardiovascular: Tachy, regular, no M/R/G.  Lungs: Respirations even and unlabored.  CTA bilaterally, No W/R/R. Abdomen: BS x 4, soft, NT/ND.  Musculoskeletal: No gross deformities, no edema.  Skin: Intact, warm, no rashes.  Assessment & Plan:   Middle cerebral artery occlusion s/p mechanical thrombectomy requiring post-op airway protection -Stroke management per neurology -F/u on MRA/MRI, echo  -Maintain full vent support and progress to daily SBT as sedation is weaned -Bronchial hygiene and RT/bronchodilator protocol. -Follow CXR  Sinus tachycardia - intermittently spiking up to 130's; though, not sustained - PRN lopressor for HR > 115  Best practice:  Diet: Start TF's Pain/Anxiety/Delirium protocol (if indicated):  VAP protocol (if indicated): yes DVT prophylaxis: SCD GI prophylaxis: protonix Glucose control: SSI Mobility: bed rest Code Status: Full Family Communication: per primary Disposition: ICU   CC time: 35 min.   Montey Hora, PA - [facility] 06/10/2019, 8:24 AM

## 2019-06-10 NOTE — Plan of Care (Signed)
Pt not interactive at this time.

## 2019-06-10 NOTE — Progress Notes (Signed)
Referring Physician(s): Code Stroke- Vonzella Nipple  Supervising Physician: Luanne Bras  Patient Status:  [facility] - In-pt  Chief Complaint: None- intubated with sedation  Subjective:  History of acute CVA s/p emergent cerebral arteriogram with mechanical thrombectomy of right MCA superior division M2/M3 segment occlusion and right MCA inferior division M2/M3 segment occlusion achieving a TICI 2C revascularization 05/17/2019 by Dr. Estanislado Pandy. Patient laying in bed intubated with sedation. She does not respond to voice/painful stimuli and does not follow simple commands. Can spontaneously move right side but no movements of left side. Right groin incision c/d/i with sheath in place.   Allergies: Claritin [loratadine], Crestor [rosuvastatin calcium], Lipitor [atorvastatin], Oxycontin [oxycodone hcl], Gabapentin, and Penicillins  Medications: Prior to Admission medications   Medication Sig Start Date End Date Taking? Authorizing Provider  acetaminophen (TYLENOL) 500 MG tablet Take 1,500 mg by mouth 3 (three) times daily as needed for moderate pain.    [provider]  amitriptyline (ELAVIL) 10 MG tablet Take 1 tablet (10 mg total) by mouth at bedtime. 12/08/17   Mikell, Jeani Sow, MD  amLODipine (NORVASC) 5 MG tablet Take 1 tablet (5 mg total) by mouth daily. Please make overdue appt with Dr. Meda Coffee before anymore refills. 3rd attempt 04/18/19   Dorothy Spark, MD  aspirin EC 81 MG tablet Take 81 mg by mouth daily.    [provider]  capsicum oleoresin (TRIXAICIN) 0.025 % cream Apply 1 application topically 3 (three) times daily. 07/20/17   Steve Rattler, DO  clopidogrel (PLAVIX) 75 MG tablet Take 1 tablet (75 mg total) by mouth daily. Please make overdue appt with Dr. Meda Coffee before anymore refills. 3rd Attempt 04/18/19   Dorothy Spark, MD  diclofenac sodium (VOLTAREN) 1 % GEL APPLY 2 GRAMS TO AFFECTED AREA 4 TIMES A DAY Patient taking differently:  Apply 2 g topically 4 (four) times daily.  06/05/18   Kinnie Feil, MD  lidocaine (XYLOCAINE) 5 % ointment Apply 1 application topically as needed. 03/25/19   Benay Pike, MD  metoprolol tartrate (LOPRESSOR) 50 MG tablet Take 1 tablet (50 mg total) by mouth 2 (two) times daily. Please keep upcoming appt in February with Dr. Meda Coffee before anymore refills. Thank you 05/23/19   Dorothy Spark, MD  nitroGLYCERIN (NITROSTAT) 0.4 MG SL tablet Place 1 tablet (0.4 mg total) under the tongue every 5 (five) minutes as needed for chest pain. 10/02/15   Dorothy Spark, MD  umeclidinium-vilanterol (ANORO ELLIPTA) 62.5-25 MCG/INH AEPB Inhale 1 puff into the lungs daily. 09/30/16   Zenia Resides, MD     Vital Signs: BP (!) 117/53   Pulse (!) 106   Temp 98.4 F (36.9 C) (Axillary)   Resp 16   Ht 5\' 5"  (1.651 m)   Wt 117 lb 8.1 oz (53.3 kg)   SpO2 100%   BMI 19.55 kg/m   Physical Exam Vitals signs and nursing note reviewed.  Constitutional:      General: She is not in acute distress.    Comments: Intubated with sedation.  Pulmonary:     Effort: Pulmonary effort is normal. No respiratory distress.     Comments: Intubated with sedation. Skin:    General: Skin is warm and dry.     Comments: Right groin incision soft with sheath in place, no active bleeding or hematoma.  Neurological:     Comments: Intubated with sedation. She does not respond to voice/painful stimuli and does not follow simple commands.  PERRL bilaterally. Can spontaneously move right side but no movements of left side. Distal pulses palpable bilaterally with Doppler.  Psychiatric:     Comments: Intubated with sedation.     Imaging: Ct Code Stroke Cta Head W/wo Contrast  Result Date: 06/10/2019 CLINICAL DATA:  Left-sided weakness EXAM: CT ANGIOGRAPHY HEAD AND NECK TECHNIQUE: Multidetector CT imaging of the head and neck was performed using the standard protocol during bolus administration of intravenous  contrast. Multiplanar CT image reconstructions and MIPs were obtained to evaluate the vascular anatomy. Carotid stenosis measurements (when applicable) are obtained utilizing NASCET criteria, using the distal internal carotid diameter as the denominator. CONTRAST:  156mL OMNIPAQUE IOHEXOL 350 MG/ML SOLN COMPARISON:  Noncontrast head CT earlier today FINDINGS: CTA NECK FINDINGS Aortic arch: Atherosclerotic plaque with 3 vessel branching. Plaque along the distal arch is irregular. Right carotid system: Atheromatous wall thickening of the common carotid. Moderate calcified plaque at the ICA bulb without 50% or greater stenosis. No ulceration. Left carotid system: Calcified plaque at the common carotid origin without flow limiting stenosis. Moderate mixed density plaque at the proximal ICA with 50% ICA stenosis. Negative for dissection. Vertebral arteries: Subclavian atherosclerosis on both sides without flow limiting stenosis. The vertebral arteries are symmetric and widely patent to the dura. Skeleton: Diffuse disc and facet degeneration. No acute or aggressive finding Other neck: No acute finding Upper chest: No acute finding.  Emphysema. Review of the MIP images confirms the above findings CTA HEAD FINDINGS Anterior circulation: Cut off of 2 right M2 branches at the level of the M2 3 junction. There is poor collateral flow with oligemic appearance on multiple slices over the posterior right frontal and parietal lobes. No left-sided branch occlusion. There is plaque at both carotid siphons without flow limiting stenosis. Apparent outpouching from the left paraclinoid ICA segment is likely calcified plaque based on reformats. No convincing aneurysm. Posterior circulation: Atherosclerotic plaque on the right V4 segment. Diffusely patent vertebral and basilar arteries. No branch occlusion, beading, or aneurysm. Venous sinuses: Unremarkable in the arterial phase Anatomic variants: None significant These results were  called by telephone at the time of interpretation on 06/03/2019 at 4:36 pm to Dr Erlinda Hong , who was already aware of the findings. Review of the MIP images confirms the above findings IMPRESSION: 1. Occlusion of 2 right MCA branches at the M2/3 junction. No flow limiting stenosis or embolic source in the more proximal right carotid circulation. Paucity of filling vessels along the affected right cerebral convexity-subjectively poor collateral flow. 2. 50% atheromatous narrowing at the proximal left ICA. Electronically Signed   By: Monte Fantasia M.D.   On: 05/28/2019 16:47   Ct Code Stroke Cta Neck W/wo Contrast  Result Date: 05/27/2019 CLINICAL DATA:  Left-sided weakness EXAM: CT ANGIOGRAPHY HEAD AND NECK TECHNIQUE: Multidetector CT imaging of the head and neck was performed using the standard protocol during bolus administration of intravenous contrast. Multiplanar CT image reconstructions and MIPs were obtained to evaluate the vascular anatomy. Carotid stenosis measurements (when applicable) are obtained utilizing NASCET criteria, using the distal internal carotid diameter as the denominator. CONTRAST:  159mL OMNIPAQUE IOHEXOL 350 MG/ML SOLN COMPARISON:  Noncontrast head CT earlier today FINDINGS: CTA NECK FINDINGS Aortic arch: Atherosclerotic plaque with 3 vessel branching. Plaque along the distal arch is irregular. Right carotid system: Atheromatous wall thickening of the common carotid. Moderate calcified plaque at the ICA bulb without 50% or greater stenosis. No ulceration. Left carotid system: Calcified plaque at the common carotid origin without  flow limiting stenosis. Moderate mixed density plaque at the proximal ICA with 50% ICA stenosis. Negative for dissection. Vertebral arteries: Subclavian atherosclerosis on both sides without flow limiting stenosis. The vertebral arteries are symmetric and widely patent to the dura. Skeleton: Diffuse disc and facet degeneration. No acute or aggressive finding Other  neck: No acute finding Upper chest: No acute finding.  Emphysema. Review of the MIP images confirms the above findings CTA HEAD FINDINGS Anterior circulation: Cut off of 2 right M2 branches at the level of the M2 3 junction. There is poor collateral flow with oligemic appearance on multiple slices over the posterior right frontal and parietal lobes. No left-sided branch occlusion. There is plaque at both carotid siphons without flow limiting stenosis. Apparent outpouching from the left paraclinoid ICA segment is likely calcified plaque based on reformats. No convincing aneurysm. Posterior circulation: Atherosclerotic plaque on the right V4 segment. Diffusely patent vertebral and basilar arteries. No branch occlusion, beading, or aneurysm. Venous sinuses: Unremarkable in the arterial phase Anatomic variants: None significant These results were called by telephone at the time of interpretation on 06/08/2019 at 4:36 pm to Dr Erlinda Hong , who was already aware of the findings. Review of the MIP images confirms the above findings IMPRESSION: 1. Occlusion of 2 right MCA branches at the M2/3 junction. No flow limiting stenosis or embolic source in the more proximal right carotid circulation. Paucity of filling vessels along the affected right cerebral convexity-subjectively poor collateral flow. 2. 50% atheromatous narrowing at the proximal left ICA. Electronically Signed   By: Monte Fantasia M.D.   On: 06/04/2019 16:47   Ct Code Stroke Cerebral Perfusion With Contrast  Result Date: 06/03/2019 CLINICAL DATA:  Left-sided weakness EXAM: CT PERFUSION BRAIN TECHNIQUE: Multiphase CT imaging of the brain was performed following IV bolus contrast injection. Subsequent parametric perfusion maps were calculated using RAPID software. CONTRAST:  170mL OMNIPAQUE IOHEXOL 350 MG/ML SOLN COMPARISON:  Noncontrast head CT earlier today FINDINGS: Attempted but unsuccessful CT perfusion due to patient motion. No additional findings are seen.  IMPRESSION: Attempted but failed and nondiagnostic CT perfusion due to patient motion. Electronically Signed   By: Monte Fantasia M.D.   On: 06/08/2019 16:36   Dg Chest Port 1 View  Result Date: 06/10/2019 CLINICAL DATA:  Intubation.  Stroke. EXAM: PORTABLE CHEST 1 VIEW COMPARISON:  09/28/2016, 05/26/2009. FINDINGS: Endotracheal tube tip noted 6 cm above the carina. Heart size normal. Left base subsegmental atelectasis and or scarring again noted. Slight increase interstitial markings noted bilaterally. Interstitial edema and/or pneumonitis cannot be excluded. Slight elevation left hemidiaphragm. Small left pleural effusion. Right costophrenic angle not completely imaged. No pneumothorax. IMPRESSION: 1.  Endotracheal tube tip noted 6 cm above the carina. 2. Left base subsegmental atelectasis and or scarring again noted. Slight increase interstitial markings noted bilaterally. Interstitial edema and/or pneumonitis cannot be excluded. 3. Slight elevation left hemidiaphragm. Small left pleural effusion. Electronically Signed   By: Marcello Moores  Register   On: 06/10/2019 07:20   Ct Head Code Stroke Wo Contrast  Result Date: 05/15/2019 CLINICAL DATA:  Code stroke.  Left-sided paralysis and facial droop EXAM: CT HEAD WITHOUT CONTRAST TECHNIQUE: Contiguous axial images were obtained from the base of the skull through the vertex without intravenous contrast. COMPARISON:  None. FINDINGS: Brain: No evidence of acute infarction, hemorrhage, hydrocephalus, extra-axial collection or mass lesion/mass effect. Vascular: No hyperdense vessel or unexpected calcification. Skull: Normal. Negative for fracture or focal lesion. Sinuses/Orbits: No acute finding. Other: These results were communicated to  Xu at 3:55 pmon 11/28/2020by text page via the [facility] messaging system. ASPECTS [facility] Stroke Program Early CT Score) - Ganglionic level infarction (caudate, lentiform nuclei, internal capsule, insula, M1-M3 cortex): 7 -  Supraganglionic infarction (M4-M6 cortex): 3 Total score (0-10 with 10 being normal): 10 IMPRESSION: No acute finding. ASPECTS is 10. Electronically Signed   By: Monte Fantasia M.D.   On: 05/19/2019 15:57    Labs:  CBC: Recent Labs    05/20/2019 1542 06/10/2019 1548 05/22/2019 2000 06/10/19 0340  WBC 10.3  --   --   --   HGB 11.6* 12.6 11.6* 11.2*  HCT 36.8 37.0 34.0* 33.0*  PLT 531*  --   --   --     COAGS: Recent Labs    05/17/2019 1542  INR 1.0  APTT 23*    BMP: Recent Labs    06/05/2019 1542 05/12/2019 1548 05/17/2019 2000 06/10/19 0340 06/10/19 0500  NA 143 142 140 138 138  K 4.1 4.1 4.0 3.8 3.7  CL 104 104  --   --  106  CO2 25  --   --   --  23  GLUCOSE 111* 109*  --   --  160*  BUN 15 17  --   --  14  CALCIUM 9.7  --   --   --  8.3*  CREATININE 0.96 0.90  --   --  0.72  GFRNONAA 55*  --   --   --  >60  GFRAA >60  --   --   --  >60    LIVER FUNCTION TESTS: Recent Labs    05/17/2019 1542  BILITOT 0.5  AST 18  ALT 11  ALKPHOS 39  PROT 7.5  ALBUMIN 3.9    Assessment and Plan:  History of acute CVA s/p emergent cerebral arteriogram with mechanical thrombectomy of right MCA superior division M2/M3 segment occlusion and right MCA inferior division M2/M3 segment occlusion achieving a TICI 2C revascularization 06/09/2019 by Dr. Estanislado Pandy. Patient's condition stable- remains intubated/sedated, moves right side but no spontaneous movements of left side. Right groin incision stable with sheath in place- plan for sheath removal today. Further plans per neurology/CCM- appreciate and agree with management. NIR to follow.   Electronically Signed: Earley Abide, PA-C 06/10/2019, 8:50 AM   I spent a total of 25 Minutes at the the patient's bedside AND on the patient's hospital floor or unit, greater than 50% of which was counseling/coordinating care for right MCA superior division M2/M3 segment occlusion and right MCA inferior division M2/M3 segment occlusion s/p  embolization.

## 2019-06-10 NOTE — Progress Notes (Signed)
SLP Cancellation Note  Patient Details Name: Syri Zenger MRN: [id number] DOB: 1934-05-13   Cancelled treatment:        On vent following procedure. Will follow and assess when able.    Houston Siren 06/10/2019, 8:34 AM    Orbie Pyo Colvin Caroli.Ed Risk analyst [phone] Office [phone]

## 2019-06-10 NOTE — Anesthesia Postprocedure Evaluation (Signed)
Anesthesia Post Note  Patient: Natalie [name]  Procedure(s) Performed: RADIOLOGY WITH ANESTHESIA (N/A )     Patient location during evaluation: NICU Anesthesia Type: General Level of consciousness: sedated and patient remains intubated per anesthesia plan Pain management: pain level controlled Vital Signs Assessment: post-procedure vital signs reviewed and stable Respiratory status: patient remains intubated per anesthesia plan and patient on ventilator - see flowsheet for VS Cardiovascular status: stable Postop Assessment: no apparent nausea or vomiting Anesthetic complications: no    Last Vitals:  Vitals:   06/10/19 0715 06/10/19 0737  BP: (!) 170/79   Pulse: (!) 102   Resp: 16   Temp:  36.9 C  SpO2: 100%     Last Pain:  Vitals:   06/10/19 0737  TempSrc: Axillary  PainSc:                  Natalie [name]

## 2019-06-10 NOTE — Progress Notes (Signed)
Echo attempted. Patient going to MRI. Nurse notified tech will be back.

## 2019-06-10 NOTE — Progress Notes (Signed)
PT Cancellation Note  Patient Details Name: Delicia Lamp MRN: [id number] DOB: 21-Oct-1933   Cancelled Treatment:    Reason Eval/Treat Not Completed: Active bedrest order. Pt intubated and sedated. R femoral sheath removed at 0928 today. PT to follow and proceed with eval when medically ready.   Lorriane Shire 06/10/2019, 10:28 AM  Lorrin Goodell, PT  Office # [phone] Pager [phone]

## 2019-06-11 ENCOUNTER — Inpatient Hospital Stay (HOSPITAL_COMMUNITY): Payer: Medicare Other

## 2019-06-11 DIAGNOSIS — G936 Cerebral edema: Secondary | ICD-10-CM

## 2019-06-11 DIAGNOSIS — I63132 Cerebral infarction due to embolism of left carotid artery: Secondary | ICD-10-CM | POA: Diagnosis not present

## 2019-06-11 DIAGNOSIS — I9589 Other hypotension: Secondary | ICD-10-CM

## 2019-06-11 DIAGNOSIS — I6601 Occlusion and stenosis of right middle cerebral artery: Secondary | ICD-10-CM | POA: Diagnosis not present

## 2019-06-11 DIAGNOSIS — I6389 Other cerebral infarction: Secondary | ICD-10-CM

## 2019-06-11 DIAGNOSIS — I639 Cerebral infarction, unspecified: Secondary | ICD-10-CM | POA: Diagnosis not present

## 2019-06-11 DIAGNOSIS — Z978 Presence of other specified devices: Secondary | ICD-10-CM | POA: Diagnosis not present

## 2019-06-11 LAB — GLUCOSE, CAPILLARY
Glucose-Capillary: 141 mg/dL — ABNORMAL HIGH (ref 70–99)
Glucose-Capillary: 121 mg/dL — ABNORMAL HIGH (ref 70–99)
Glucose-Capillary: 111 mg/dL — ABNORMAL HIGH (ref 70–99)

## 2019-06-11 LAB — ECHOCARDIOGRAM COMPLETE
Height: 65 in
Weight: 2045.87 oz

## 2019-06-11 MED ORDER — ONDANSETRON 4 MG PO TBDP: 4.0000 mg | ORAL_TABLET | Freq: Four times a day (QID) | ORAL | Status: DC | PRN

## 2019-06-11 MED ORDER — PHENYLEPHRINE HCL-NACL 10-0.9 MG/250ML-% IV SOLN
INTRAVENOUS | Status: AC
  Filled 2019-06-11: qty 250

## 2019-06-11 MED ORDER — GLYCOPYRROLATE 0.2 MG/ML IJ SOLN: 0.2000 mg | INTRAMUSCULAR | Status: DC | PRN

## 2019-06-11 MED ORDER — SCOPOLAMINE 1 MG/3DAYS TD PT72
1.0000 | MEDICATED_PATCH | TRANSDERMAL | Status: DC
  Administered 2019-06-11: 1.5 mg via TRANSDERMAL
  Filled 2019-06-11: qty 1

## 2019-06-11 MED ORDER — HALOPERIDOL 0.5 MG PO TABS
0.5000 mg | ORAL_TABLET | ORAL | Status: DC | PRN
  Filled 2019-06-11: qty 1

## 2019-06-11 MED ORDER — ONDANSETRON HCL 4 MG/2ML IJ SOLN: 4.0000 mg | Freq: Four times a day (QID) | INTRAMUSCULAR | Status: DC | PRN

## 2019-06-11 MED ORDER — PHENYLEPHRINE HCL-NACL 10-0.9 MG/250ML-% IV SOLN
0.0000 ug/min | INTRAVENOUS | Status: DC
  Administered 2019-06-11: 20 ug/min via INTRAVENOUS
  Administered 2019-06-11: 50 ug/min via INTRAVENOUS
  Filled 2019-06-11: qty 250

## 2019-06-11 MED ORDER — BIOTENE DRY MOUTH MT LIQD: 15.0000 mL | OROMUCOSAL | Status: DC | PRN

## 2019-06-11 MED ORDER — GLYCOPYRROLATE 1 MG PO TABS: 1.0000 mg | ORAL_TABLET | ORAL | Status: DC | PRN

## 2019-06-11 MED ORDER — HALOPERIDOL LACTATE 5 MG/ML IJ SOLN: 0.5000 mg | INTRAMUSCULAR | Status: DC | PRN

## 2019-06-11 MED ORDER — HALOPERIDOL LACTATE 2 MG/ML PO CONC
0.5000 mg | ORAL | Status: DC | PRN
  Filled 2019-06-11: qty 0.3

## 2019-06-11 MED ORDER — POLYVINYL ALCOHOL 1.4 % OP SOLN
1.0000 [drp] | Freq: Four times a day (QID) | OPHTHALMIC | Status: DC | PRN
  Filled 2019-06-11: qty 15

## 2019-06-11 MED ORDER — MORPHINE 100MG IN NS 100ML (1MG/ML) PREMIX INFUSION
1.0000 mg/h | INTRAVENOUS | Status: DC
  Administered 2019-06-11: 14:00:00 2 mg/h via INTRAVENOUS
  Filled 2019-06-11 (×2): qty 100

## 2019-06-11 DEATH — deceased

## 2019-06-12 ENCOUNTER — Encounter (HOSPITAL_COMMUNITY): Payer: Self-pay | Admitting: Interventional Radiology

## 2019-06-13 DIAGNOSIS — I959 Hypotension, unspecified: Secondary | ICD-10-CM | POA: Diagnosis not present

## 2019-06-13 DIAGNOSIS — D62 Acute posthemorrhagic anemia: Secondary | ICD-10-CM | POA: Diagnosis not present

## 2019-06-13 DIAGNOSIS — D75839 Thrombocytosis, unspecified: Secondary | ICD-10-CM | POA: Diagnosis not present

## 2019-06-13 DIAGNOSIS — I69391 Dysphagia following cerebral infarction: Secondary | ICD-10-CM

## 2019-06-13 DIAGNOSIS — E46 Unspecified protein-calorie malnutrition: Secondary | ICD-10-CM | POA: Diagnosis present

## 2019-06-13 DIAGNOSIS — Z823 Family history of stroke: Secondary | ICD-10-CM

## 2019-06-13 DIAGNOSIS — I739 Peripheral vascular disease, unspecified: Secondary | ICD-10-CM | POA: Diagnosis present

## 2019-06-13 DIAGNOSIS — J9600 Acute respiratory failure, unspecified whether with hypoxia or hypercapnia: Secondary | ICD-10-CM | POA: Diagnosis present

## 2019-06-13 DIAGNOSIS — D473 Essential (hemorrhagic) thrombocythemia: Secondary | ICD-10-CM | POA: Diagnosis not present

## 2019-06-13 DIAGNOSIS — Z8673 Personal history of transient ischemic attack (TIA), and cerebral infarction without residual deficits: Secondary | ICD-10-CM

## 2019-06-14 ENCOUNTER — Encounter (HOSPITAL_COMMUNITY): Payer: Self-pay

## 2019-07-12 NOTE — Progress Notes (Signed)
eLink Physician-Brief Progress Note Patient Name: Natalie [name] DOB: Feb 12, 1934 MRN: [id number]   Date of Service  2019/07/01  HPI/Events of Note  Hypotension - BP = 89/58 with MAP = 67. No CVL or CVP.  eICU Interventions  Will order: 1. Phenylephrine IV infusion. Titrate to MAP >= 65.      Intervention Category Major Interventions: Hypotension - evaluation and management  Sommer,Steven Eugene Jul 01, 2019, 6:55 AM

## 2019-07-12 NOTE — Progress Notes (Signed)
Patient extubated (one-way). RN and family at bedside. No complications.

## 2019-07-12 NOTE — Progress Notes (Signed)
Assisted tele visit to patient with family member.  Venola Castello R, RN  

## 2019-07-12 NOTE — Progress Notes (Signed)
Patient extubated, family will visit once more before leaving. Patient placement card given to them when they have decided on funeral arrangements. Belongings have been sent home with family. Britney Captain, Rande Brunt, RN

## 2019-07-12 NOTE — Progress Notes (Signed)
NAME:  Natalie [name], MRN:  [id number], DOB:  02-21-1934, LOS: 2 ADMISSION DATE:  05/29/2019, CONSULTATION DATE:  2019/07/05 REFERRING MD:  Dr Erlinda Hong. , CHIEF COMPLAINT:  R middle cerebral artery occlusion   Brief History   86 y.o. F who developed L hemiplegia and slurred speech, last known well 1505, found to have R middle cerebral artery occlusion who received TPA and underwent mechanical thrombectomy.  Following the procedure pt was not following commands, so was left intubated for airway protection. PCCM consulted for ventilator management.   History of present illness   Natalie [name] is a 86 y.o. F with  CAD s/p stenting, colon cancer and resection, TIA, HTN, HL who was watching TV this afternoon and noted to have sudden onset L facial droop, slurred speech and R gaze deviation. Brought in by EMS and CT head was negative, CTA with occlusion of M2/3.  NIHSS 17; she was given TPA and taken to IR for thrombectomy.  Was restless and not following commands post-procedure, so PCCM consulted for vent management.  Past Medical History   has a past medical history of Back pain, CAD (coronary artery disease), Colon cancer ([place]), GERD (gastroesophageal reflux disease), Glaucoma suspect, HTN (hypertension), Hyperlipidemia, Shingles (11/22/202018), TIA (transient ischemic attack), and UTI (urinary tract infection).  Significant Hospital Events   11/29 Admit to Neurology, given TPA and underwent mechanical thrombectomy. 12/1- Starting neo for hypotension  Consults:  PCCM  Procedures:  11/29 Mechanical Thrombectomy M2/M3 11/29 ETT  Significant Diagnostic Tests:  11/29 CT head>>no acute findings 11/29 CTA>>Occlusion of 2 right MCA branches at the M2/3 junction. 9/30 MRI brain>> right cerebral hemisphere infarct involving the majority of the left hemisphere  Micro Data:  11/29 Sars-CoV-2>>negative  Antimicrobials:    Interim history/subjective:  Sedated, on vent Started peripheral neo  earlier today for hypotension.   Objective   Blood pressure (!) 142/82, pulse 86, temperature 97.7 F (36.5 C), temperature source Axillary, resp. rate 16, height 5\' 5"  (1.651 m), weight 58 kg, SpO2 100 %.    Vent Mode: PRVC FiO2 (%):  [40 %] 40 % Set Rate:  [16 bmp] 16 bmp Vt Set:  [450 mL] 450 mL PEEP:  [5 cmH20] 5 cmH20 Plateau Pressure:  [10 cmH20-14 cmH20] 12 cmH20   Intake/Output Summary (Last 24 hours) at 2019-07-05 0949 Last data filed at 2019-07-05 0800 Gross per 24 hour  Intake 1886.08 ml  Output 585 ml  Net 1301.08 ml   Filed Weights   06/09/19 1600 05-Jul-2019 0500  Weight: 53.3 kg 58 kg   Physical Exam: Gen:      No acute distress HEENT:  EOMI, sclera anicteric Neck:     No masses; no thyromegaly, ETT Lungs:    Clear to auscultation bilaterally; normal respiratory effort CV:         Regular rate and rhythm; no murmurs Abd:      + bowel sounds; soft, non-tender; no palpable masses, no distension Ext:    No edema; adequate peripheral perfusion Skin:      Warm and dry; no rash Neuro: Sedated, non responsive  Assessment & Plan:  Middle cerebral artery occlusion s/p mechanical thrombectomy requiring post-op airway protection -Stroke management per neurology -Continue vent support.  Mental status is a barrier to weaning -Bronchial hygiene and RT/bronchodilator protocol.  Sinus tachycardia - intermittently spiking up to 130's; though, not sustained Hypotension - PRN lopressor for HR > 115  Best practice:  Diet: Start TF's Pain/Anxiety/Delirium protocol (if indicated):  VAP protocol (if indicated): yes DVT prophylaxis: SCD GI prophylaxis: protonix Glucose control: SSI Mobility: bed rest Code Status: Full Family Communication: per primary Disposition: ICU  The patient is critically ill with multiple organ system failure and requires high complexity decision making for assessment and support, frequent evaluation and titration of therapies, advanced monitoring,  review of radiographic studies and interpretation of complex data.   Critical Care Time devoted to patient care services, exclusive of separately billable procedures, described in this note is 35 minutes.   Marshell Garfinkel MD [facility] Pager [phone] If no answer call [phone] 2019-06-27, 11:16 AM

## 2019-07-12 NOTE — Progress Notes (Signed)
  Echocardiogram 2D Echocardiogram has been performed.  Natalie [name] A Natalie [name] June 20, 2019, 8:30 AM

## 2019-07-12 NOTE — Progress Notes (Signed)
Attempted to call family twice with time of death but could not reach anyone. Cybil Senegal, Rande Brunt, RN

## 2019-07-12 NOTE — Progress Notes (Signed)
SLP Cancellation Note  Patient Details Name: Natalie [name] MRN: [id number] DOB: 1934-05-04   Cancelled treatment:       Reason Eval/Treat Not Completed: Patient not medically ready; will follow along.   Venita Sheffield Muhammadali Ries 06-25-2019, 7:15 AM  Pollyann Glen, M.A. [place] Acute Environmental education officer [phone] Office [phone]

## 2019-07-12 NOTE — Progress Notes (Signed)
OT Cancellation Note  Patient Details Name: Natalie [name] MRN: [id number] DOB: Aug 05, 1933   Cancelled Treatment:    Reason Eval/Treat Not Completed: Patient not medically ready(Family meeting to determine if comfort care.) Will hold OT evaluation and return as schedule allows and in anticipation of family decision.  [place], OTR/L Acute Rehab Pager: [phone] Office: [phone] 06-28-2019, 1:01 PM

## 2019-07-12 NOTE — Progress Notes (Signed)
Communicated with family TOD. They were appreciative of our care. Airel Magadan, Rande Brunt, RN

## 2019-07-12 NOTE — Death Summary Note (Signed)
Stroke Discharge Summary  Patient ID: Natalie [name]   MRN: [id number]      DOB: October 22, 1933  Date of Admission: 06/08/2019 Date of Discharge: 06/12/2019  Attending Physician: Dr. Rosalin Hawking, Stroke MD Consultant(s):   Wyman Songster, MD (Interventional Neuroradiologist), Laurelyn Sickle, MD ( pulmonary/intensive care ) Patient's PCP:  Lurline Del, DO  DISCHARGE DIAGNOSIS:  Principal Problem:   Acute CVA (cerebrovascular accident) [facility]) Active Problems:   Hyperlipidemia   Essential hypertension, benign   CAD, NATIVE VESSEL   Middle cerebral artery embolism, right   Airway compromise   Past Medical History:  Diagnosis Date  . Back pain    with radiculopathy  . CAD (coronary artery disease)    prior DES Jan 2007 with low risk myoview in 2008  . Colon cancer ([place])    prior adenocarcinoma of cecum in 2000 s/p chemo and colon resection  . GERD (gastroesophageal reflux disease)   . Glaucoma suspect   . HTN (hypertension)   . Hyperlipidemia   . Shingles 11/22/202018  . TIA (transient ischemic attack)   . UTI (urinary tract infection)    Past Surgical History:  Procedure Laterality Date  . ABDOMINAL HYSTERECTOMY    . BACK SURGERY    . CORONARY STENT PLACEMENT  Jan 2007   DES to RCA  . IR PERCUTANEOUS ART THROMBECTOMY/INFUSION INTRACRANIAL INC DIAG ANGIO  06/06/2019  . Microdiscectomy     L4-L5  . RADIOLOGY WITH ANESTHESIA N/A 06/09/2019   Procedure: RADIOLOGY WITH ANESTHESIA;  Surgeon: Luanne Bras, MD;  Location: [place];  Service: Radiology;  Laterality: N/A;    Allergies as of 2019/06/16      Reactions   Claritin [loratadine] Other (See Comments)   Headaches, Sinus pain, Swelling of the sinuses   Crestor [rosuvastatin Calcium] Other (See Comments)   Muscle aches and pains   Lipitor [atorvastatin] Other (See Comments)   Muscle aches on 40 mg daily   Oxycontin [oxycodone Hcl] Nausea And Vomiting   Gabapentin    Penicillins Rash       Medication List    ASK your doctor about these medications   acetaminophen 500 MG tablet Commonly known as: TYLENOL Take 1,500 mg by mouth 3 (three) times daily as needed for moderate pain.   amitriptyline 10 MG tablet Commonly known as: ELAVIL Take 1 tablet (10 mg total) by mouth at bedtime.   amLODipine 5 MG tablet Commonly known as: NORVASC Take 1 tablet (5 mg total) by mouth daily. Please make overdue appt with Dr. Meda Coffee before anymore refills. 3rd attempt   aspirin EC 81 MG tablet Take 81 mg by mouth daily.   capsicum oleoresin 0.025 % cream Commonly known as: TRIXAICIN Apply 1 application topically 3 (three) times daily.   clopidogrel 75 MG tablet Commonly known as: PLAVIX Take 1 tablet (75 mg total) by mouth daily. Please make overdue appt with Dr. Meda Coffee before anymore refills. 3rd Attempt   diclofenac sodium 1 % Gel Commonly known as: VOLTAREN APPLY 2 GRAMS TO AFFECTED AREA 4 TIMES A DAY   lidocaine 5 % ointment Commonly known as: XYLOCAINE Apply 1 application topically as needed.   metoprolol tartrate 50 MG tablet Commonly known as: LOPRESSOR Take 1 tablet (50 mg total) by mouth 2 (two) times daily. Please keep upcoming appt in February with Dr. Meda Coffee before anymore refills. Thank you   nitroGLYCERIN 0.4 MG SL tablet Commonly known as: NITROSTAT Place 1 tablet (0.4 mg total) under the  tongue every 5 (five) minutes as needed for chest pain.   umeclidinium-vilanterol 62.5-25 MCG/INH Aepb Commonly known as: Anoro Ellipta Inhale 1 puff into the lungs daily.       LABORATORY STUDIES CBC    Component Value Date/Time   WBC 11.7 (H) 06/10/2019 0500   RBC 3.33 (L) 06/10/2019 0500   HGB 10.2 (L) 06/10/2019 0500   HCT 31.2 (L) 06/10/2019 0500   PLT 421 (H) 06/10/2019 0500   MCV 93.7 06/10/2019 0500   MCH 30.6 06/10/2019 0500   MCHC 32.7 06/10/2019 0500   RDW 13.4 06/10/2019 0500   LYMPHSABS 0.3 (L) 06/10/2019 0500   MONOABS 0.3 06/10/2019 0500    EOSABS 0.0 06/10/2019 0500   BASOSABS 0.0 06/10/2019 0500   CMP    Component Value Date/Time   NA 138 06/10/2019 0500   NA 143 11/27/2017 1642   K 3.7 06/10/2019 0500   CL 106 06/10/2019 0500   CO2 23 06/10/2019 0500   GLUCOSE 160 (H) 06/10/2019 0500   BUN 14 06/10/2019 0500   BUN 16 11/27/2017 1642   CREATININE 0.72 06/10/2019 0500   CREATININE 0.79 05/26/2016 1011   CALCIUM 8.3 (L) 06/10/2019 0500   PROT 7.5 05/17/2019 1542   PROT 7.6 02/13/2018 1447   ALBUMIN 3.9 06/01/2019 1542   ALBUMIN 4.2 02/13/2018 1447   AST 18 06/06/2019 1542   ALT 11 05/30/2019 1542   ALKPHOS 39 05/15/2019 1542   BILITOT 0.5 05/28/2019 1542   BILITOT 0.3 02/13/2018 1447   GFRNONAA >60 06/10/2019 0500   GFRAA >60 06/10/2019 0500   COAGS Lab Results  Component Value Date   INR 1.0 05/13/2019   INR 0.93 11/17/2016   INR 0.95 05/18/2009   Lipid Panel    Component Value Date/Time   CHOL 195 06/10/2019 0500   CHOL 235 (H) 02/13/2018 1447   TRIG 92 06/10/2019 0500   HDL 63 06/10/2019 0500   HDL 62 02/13/2018 1447   CHOLHDL 3.1 06/10/2019 0500   VLDL 18 06/10/2019 0500   LDLCALC 114 (H) 06/10/2019 0500   LDLCALC 134 (H) 02/13/2018 1447   HgbA1C  Lab Results  Component Value Date   HGBA1C 5.6 06/10/2019   Urinalysis    Component Value Date/Time   COLORURINE YELLOW 03/04/2017 0849   APPEARANCEUR CLEAR 03/04/2017 0849   LABSPEC 1.016 03/04/2017 0849   PHURINE 5.0 03/04/2017 0849   GLUCOSEU NEGATIVE 03/04/2017 0849   HGBUR NEGATIVE 03/04/2017 0849   HGBUR small 07/29/2009 1104   BILIRUBINUR negative 02/01/2019 1410   BILIRUBINUR NEG 11/23/2015 1115   KETONESUR small (15) (A) 02/01/2019 1410   KETONESUR NEGATIVE 03/04/2017 0849   PROTEINUR =100 (A) 02/01/2019 1410   PROTEINUR NEGATIVE 03/04/2017 0849   UROBILINOGEN 0.2 02/01/2019 1410   UROBILINOGEN 0.2 05/16/2012 1517   NITRITE Positive (A) 02/01/2019 1410   NITRITE NEGATIVE 03/04/2017 0849   LEUKOCYTESUR Large (3+) (A)  02/01/2019 1410   Urine Drug Screen     Component Value Date/Time   LABOPIA NONE DETECTED 05/08/2013 0215   COCAINSCRNUR NONE DETECTED 05/08/2013 0215   LABBENZ NONE DETECTED 05/08/2013 0215   AMPHETMU NONE DETECTED 05/08/2013 0215   THCU NONE DETECTED 05/08/2013 0215   LABBARB NONE DETECTED 05/08/2013 0215    Alcohol Level No results found for: [facility]   SIGNIFICANT DIAGNOSTIC STUDIES 2D Echocardiogram 07/05/2019 1. Left ventricular ejection fraction, by visual estimation, is 60 to 65%. The left ventricle has normal function. There is no left ventricular hypertrophy. 2. Elevated left  ventricular end-diastolic pressure. 3. Left ventricular diastolic parameters are consistent with Grade II diastolic dysfunction (pseudonormalization). 4. Small hyperdynamic LV with small cavity possible small mid cavitary gradient no SAM. 5. Global right ventricle has normal systolic function.The right ventricular size is normal. No increase in right ventricular wall thickness. 6. Left atrial size was normal. 7. Right atrial size was normal. 8. Moderate calcification of the mitral valve leaflet(s). 9. Moderate thickening of the mitral valve leaflet(s). 10. Severe mitral annular calcification. 11. The mitral valve is normal in structure. Trace mitral valve regurgitation. No evidence of mitral stenosis. 12. The tricuspid valve is normal in structure. Tricuspid valve regurgitation is not demonstrated. 13. The aortic valve is tricuspid. Aortic valve regurgitation is not visualized. Mild to moderate aortic valve sclerosis/calcification without any evidence of aortic stenosis. 14. The pulmonic valve was normal in structure. Pulmonic valve regurgitation is not visualized. 15. The inferior vena cava is normal in size with greater than 50% respiratory variability, suggesting right atrial pressure of 3 mmHg   Mr [facility] Contrast Mr Brain Wo Contrast 06/10/2019 1557 5-6 cm region of acute  infarction in the right cerebral hemisphere affecting the insula and frontoparietal brain. No hemorrhage in that region. Acute infarction of the majority of the left hemisphere throughout the anterior and middle cerebral artery territories, with sparing of the PCA territory. Swelling with left-to-right shift of 1 cm. Petechial blood products present particularly in the left frontal operculum and temporal tip region. Occlusion of the main left M2 branch. Occlusion of the left anterior cerebral artery. Diminished flow in the smaller left M2 branch.  Dg Chest Port 1 View 06/10/2019 0720 1.  Endotracheal tube tip noted 6 cm above the carina. 2. Left base subsegmental atelectasis and or scarring again noted. Slight increase interstitial markings noted bilaterally. Interstitial edema and/or pneumonitis cannot be excluded. 3. Slight elevation left hemidiaphragm. Small left pleural effusion.   Cerebral angio 05/21/2019 S/P RT Common carotid arteriogram ffollowed by complete revascularization of sup division M2/3 and INf division M2/M3 segs With x 1 pass with aspiration in the sup division and 3mg  of IA tpa in the sup division achieving a TICI 2 C revascularization  Ct Code Stroke Cta Head W/wo Contrast Ct Code Stroke Cta Neck W/wo Contrast 06/08/2019 1647 1. Occlusion of 2 right MCA branches at the M2/3 junction. No flow limiting stenosis or embolic source in the more proximal right carotid circulation. Paucity of filling vessels along the affected right cerebral convexity-subjectively poor collateral flow. 2. 50% atheromatous narrowing at the proximal left ICA.   Ct Code Stroke Cerebral Perfusion With Contrast 05/16/2019 1636  Attempted but failed and nondiagnostic CT perfusion due to patient motion.   Ct Head Code Stroke Wo Contrast 06/07/2019 1557 No acute finding. ASPECTS is 10.     HISTORY OF PRESENT ILLNESS Natalie [name] is a 86 y.o. African American female with With PMH TIA  04/2013, HTN, HLD, colon cancer s/p resection 2000, CAD s/p stent, PAD who presented to [facility] ED as a code stroke with c/o slurred speech and left sided weakness.  The history was obtained from the daughter and EMS.  Per daughter patient was lying in bed watching TV completely normal and at 1505 she suddenly had a slurred speech and then was found to have left sided weakness (LKW 1505 on 05/23/2019). EMS was called. EMS also found pt also has right gaze and left neglect. Code stroke activated. On ED arrival, pt NIHSS = 17. CT no  bleeding. tPA started. CTA head and neck showed right M2/M3 occlusion, discussed with IR and would proceed with MT after risk and benefit discussion with family.  HOSPITAL COURSE Ms. Natalie [name] is a 86 y.o. female with history of TIA10/2014, HTN, HLD,colon cancer s/p resection 2000,CADs/p stent, PAD presenting with slurred speech and left sided weakness. tPA given 06/03/2019 at 1608.   Stroke: R MCA infarct due to right M2/M3 occlusion s/p IV tPA and IR w/ IA tPA, infarct embolic secondary to unknown source  Code Stroke CT head No acute abnormality. ASPECTS 10    CTA head & neck occlusion of 2 R MCA branches at M2/3 junction. Proximal L ICA 50% stenosis.  Cerebral angio occlusion of superior and inferior division R M2/3 segments w/ TICI2c revascularization w/ mechanical thrombectomy and IA tPA   MRI R insula and frontoparietal infarct. Majority of L ACA, MCA infarct with sparing of PCA. 1 mm midline shift. Petechial hemorrhage present.    MRA  L M2 branch occlusion. L ACA occlusion. Diminished flow smaller L M2.   2D Echo EF 60-65%   LDL 114  HgbA1c 5.6  aspirin 81 mg daily and clopidogrel 75 mg daily prior to admission, did not receive antithrombotic within 24h post tPA. Planned to resume aspirin if 24h imaging neg for hemorrhage.  Stroke large with poor neuro outcome, family meeting with decision for once-way extubation followed by comfort care.    Acute Respiratory Failure  Intubated for IR.   Left intubated post IR for not following commands and for airway protection  CCM onboard  Hypotension Hx Hypertension  Home meds:  norvasc 5, lopressor 50 bid  BP low 89/58  Started on phenylephrine  Hyperlipidemia  Home meds:  No statin  LDL 114, goal < 70  Intolerant to statins  Dysphagia Malnutrition  Secondary to stroke  NPO  On tube feedings  Other Stroke Risk Factors  Advanced age  Former Cigarette smoker, quit 25 yrs ago  Hx stroke/TIA ? 04/2013 - TIA w/ sx arm "heaviness" and numbness   Family hx stroke (father)  Coronary artery disease s/p DES in 2007  PAD  Other Active Problems  Colon cancer s/p chemo & resection  GERD  Acute blood loss anemia 11.6->11.2->10.2->  Thrombocytosis 421  DISCHARGE  Disposition:  Death 06-30-2019 1508  20 minutes were spent preparing discharge.  Burnetta Sabin, MSN, APRN, ANVP-BC, AGPCNP-BC Advanced Practice Stroke Nurse [place] for Schedule & Pager information 06/13/2019 2:37 PM

## 2019-07-12 NOTE — Progress Notes (Signed)
PT Cancellation Note  Patient Details Name: Avantika Truglio MRN: [id number] DOB: 07-17-1933   Cancelled Treatment:    Reason Eval/Treat Not Completed: Medical issues which prohibited therapy.  Pt not responsive.  Family meeting to determine if comfort care.  Will hold for today in anticipation of family decision. 06-Jul-2019  Donnella Sham, PT Acute Rehabilitation Services [phone]  (pager) [phone]  (office)   Tessie Fass January Bergthold July 06, 2019, 9:53 AM

## 2019-07-12 NOTE — Progress Notes (Signed)
Family has said their goodbyes and have had time with the pt. They wish to transition to comfort measures and one way extubation. Dr Erlinda Hong is aware. CCM paged. Waiting on orders at this time. Bilal Manzer, Rande Brunt, RN

## 2019-07-12 NOTE — Progress Notes (Signed)
STROKE TEAM PROGRESS NOTE   INTERVAL HISTORY Pt unresponsive, still intubated. Overnight BP was low, put on pressor. Family meeting at 11am. Shared with them about neuro images, poor prognosis, answered all their questions. I have to leave the meeting due to the code stroke. Family were discussing about next step.   Vitals:   07/01/19 0600 07/01/19 0630 07/01/19 0700 2019/07/01 0750  BP: 112/73 (S) (!) 89/58 117/74   Pulse: (!) 127 (!) 127 97   Resp: 16 16 16    Temp:      TempSrc:      SpO2: 100% 100% 100% 100%  Weight:      Height:        CBC:  Recent Labs  Lab 06/08/2019 1542  06/10/19 0340 06/10/19 0500  WBC 10.3  --   --  11.7*  NEUTROABS 6.1  --   --  11.0*  HGB 11.6*   < > 11.2* 10.2*  HCT 36.8   < > 33.0* 31.2*  MCV 95.6  --   --  93.7  PLT 531*  --   --  421*   < > = values in this interval not displayed.    Basic Metabolic Panel:  Recent Labs  Lab 05/17/2019 1542 06/07/2019 1548  06/10/19 0340 06/10/19 0500 06/10/19 1718  NA 143 142   < > 138 138  --   K 4.1 4.1   < > 3.8 3.7  --   CL 104 104  --   --  106  --   CO2 25  --   --   --  23  --   GLUCOSE 111* 109*  --   --  160*  --   BUN 15 17  --   --  14  --   CREATININE 0.96 0.90  --   --  0.72  --   CALCIUM 9.7  --   --   --  8.3*  --   MG  --   --   --   --   --  1.3*  PHOS  --   --   --   --   --  4.2   < > = values in this interval not displayed.   Lipid Panel:     Component Value Date/Time   CHOL 195 06/10/2019 0500   CHOL 235 (H) 02/13/2018 1447   TRIG 92 06/10/2019 0500   HDL 63 06/10/2019 0500   HDL 62 02/13/2018 1447   CHOLHDL 3.1 06/10/2019 0500   VLDL 18 06/10/2019 0500   LDLCALC 114 (H) 06/10/2019 0500   LDLCALC 134 (H) 02/13/2018 1447   HgbA1c:  Lab Results  Component Value Date   HGBA1C 5.6 06/10/2019   Urine Drug Screen:     Component Value Date/Time   LABOPIA NONE DETECTED 05/08/2013 0215   COCAINSCRNUR NONE DETECTED 05/08/2013 0215   LABBENZ NONE DETECTED 05/08/2013 0215    AMPHETMU NONE DETECTED 05/08/2013 0215   THCU NONE DETECTED 05/08/2013 0215   LABBARB NONE DETECTED 05/08/2013 0215    Alcohol Level No results found for: [facility]  IMAGING  2D Echocardiogram 2019-07-01  1. Left ventricular ejection fraction, by visual estimation, is 60 to 65%. The left ventricle has normal function. There is no left ventricular hypertrophy.  2. Elevated left ventricular end-diastolic pressure.  3. Left ventricular diastolic parameters are consistent with Grade II diastolic dysfunction (pseudonormalization).  4. Small hyperdynamic LV with small cavity possible small mid cavitary gradient no SAM.  5. Global right ventricle has normal systolic function.The right ventricular size is normal. No increase in right ventricular wall thickness.  6. Left atrial size was normal.  7. Right atrial size was normal.  8. Moderate calcification of the mitral valve leaflet(s).  9. Moderate thickening of the mitral valve leaflet(s). 10. Severe mitral annular calcification. 11. The mitral valve is normal in structure. Trace mitral valve regurgitation. No evidence of mitral stenosis. 12. The tricuspid valve is normal in structure. Tricuspid valve regurgitation is not demonstrated. 13. The aortic valve is tricuspid. Aortic valve regurgitation is not visualized. Mild to moderate aortic valve sclerosis/calcification without any evidence of aortic stenosis. 14. The pulmonic valve was normal in structure. Pulmonic valve regurgitation is not visualized. 15. The inferior vena cava is normal in size with greater than 50% respiratory variability, suggesting right atrial pressure of 3 mmHg   Mr [facility] Contrast Mr Brain Wo Contrast 06/10/2019 1557 5-6 cm region of acute infarction in the right cerebral hemisphere affecting the insula and frontoparietal brain. No hemorrhage in that region. Acute infarction of the majority of the left hemisphere throughout the anterior and middle cerebral artery  territories, with sparing of the PCA territory. Swelling with left-to-right shift of 1 cm. Petechial blood products present particularly in the left frontal operculum and temporal tip region. Occlusion of the main left M2 branch. Occlusion of the left anterior cerebral artery. Diminished flow in the smaller left M2 branch.  Dg Chest Port 1 View 06/10/2019 0720 1.  Endotracheal tube tip noted 6 cm above the carina. 2. Left base subsegmental atelectasis and or scarring again noted. Slight increase interstitial markings noted bilaterally. Interstitial edema and/or pneumonitis cannot be excluded. 3. Slight elevation left hemidiaphragm. Small left pleural effusion.   Cerebral angio 05/15/2019 S/P RT Common carotid arteriogram ffollowed by complete revascularization of sup division M2/3 and INf division M2/M3 segs  With x 1 pass with aspiration in the sup division and 3mg  of IA tpa in the sup division achieving a TICI 2 C revascularization  Ct Code Stroke Cta Head W/wo Contrast Ct Code Stroke Cta Neck W/wo Contrast 05/29/2019 1647 1. Occlusion of 2 right MCA branches at the M2/3 junction. No flow limiting stenosis or embolic source in the more proximal right carotid circulation. Paucity of filling vessels along the affected right cerebral convexity-subjectively poor collateral flow. 2. 50% atheromatous narrowing at the proximal left ICA.   Ct Code Stroke Cerebral Perfusion With Contrast 05/12/2019 1636  Attempted but failed and nondiagnostic CT perfusion due to patient motion.   Ct Head Code Stroke Wo Contrast 05/13/2019 1557 No acute finding. ASPECTS is 10.    PHYSICAL EXAM   Temp:  [97.6 F (36.4 C)-98.7 F (37.1 C)] 97.7 F (36.5 C) (12/01 0400) Pulse Rate:  [85-134] 97 (12/01 0700) Resp:  [14-22] 16 (12/01 0700) BP: (89-166)/(51-80) 117/74 (12/01 0700) SpO2:  [97 %-100 %] 100 % (12/01 0750) Arterial Line BP: (72-157)/(44-81) 73/45 (12/01 0700) FiO2 (%):  [40 %] 40 % (12/01  0750) Weight:  [58 kg] 58 kg (12/01 0500)  General - Well nourished, well developed, off sedation.  Ophthalmologic - fundi not visualized due to noncooperation.  Cardiovascular - Regular rhythm but tachycardia.  Neuro - intubated off sedation, eyes closed, not following commands. With forced eye opening, eyes in mid position, not blinking to visual threat, doll's eyes sluggish, not tracking, PERRL. Corneal reflex weak bilaterally, gag and cough weak reflex. Breathing over the vent.  Facial symmetry not able  to test due to ET tube.  Tongue protrusion not cooperative. On pain stimulation, no movement in all extremities. DTR diminished and no babinski. Sensation, coordination and gait not tested.   ASSESSMENT/PLAN Ms. Pachia Shaker is a 86 y.o. female with history of TIA 04/2013, HTN, HLD, colon cancer s/p resection 2000, CAD s/p stent, PAD presenting with slurred speech and left sided weakness.   Stroke: R MCA infarct due to right M2/M3 occlusion s/p IV tPA and IR w/ IA tPA, embolic secondary to unknown source  Code Stroke CT head No acute abnormality. ASPECTS 10    CTA head & neck occlusion of 2 R MCA branches at M2/3 junction. Proximal L ICA 50% stenosis.  Cerebral angio occlusion of superior and inferior division R M2/3 segments w/ TICI2c revascularization w/ mechanical thrombectomy and IA tPA   MRI R insula and frontoparietal infarct. Majority of L ACA, MCA infarct with sparing of PCA. 1 mm midline shift. Petechial hemorrhage present.    MRA  L M2 branch occlusion. L ACA occlusion. Diminished flow smaller L M2.   2D Echo EF 60-65%   LDL 114  HgbA1c 5.6  SCDs for VTE prophylaxis  aspirin 81 mg daily and clopidogrel 75 mg daily prior to admission, now on No antithrombotic as within 24h post tPA. Plan resume aspirin if 24h imaging neg for hemorrhage.  Therapy recommendations:  pending   Disposition:  pending   Stroke large with poor neuro outcome, family meeting done  and family considering comfort care.  Acute Respiratory Failure  Intubated for IR.   Left intubated post IR for not following commands and for airway protection  CCM onboard  Hypotension Hx Hypertension  Home meds:  norvasc 5, lopressor 50 bid  BP now low 89/58  Started on phenylephrine . Long-term BP goal normotensive  Hyperlipidemia  Home meds:  No statin  LDL 114, goal < 70  Intolerant to statins  Dysphagia . Secondary to stroke . NPO . Speech on board  Other Stroke Risk Factors  Advanced age  Former Cigarette smoker, quit 25 yrs ago  Hx stroke/TIA  04/2013 - TIA w/ sx arm "heaviness" and numbness   Family hx stroke (father)  Coronary artery disease s/p DES in 2007  PAD  Other Active Problems  Colon cancer s/p chemo & resection  GERD  Acute blood loss anemia 11.6->11.2->10.2  Thrombocytosis [facility] day # 2  This patient is critically ill due to right MCA infarct, s/p tPA, IA tPA and MT, HTN and at significant risk of neurological worsening, death form recurrent stroke, hemorrhagic conversion, seizure, cerebral edema. This patient's care requires constant monitoring of vital signs, hemodynamics, respiratory and cardiac monitoring, review of multiple databases, neurological assessment, discussion with family, other specialists and medical decision making of high complexity. I spent 35 minutes of neurocritical care time in the care of this patient. I had family meeting with multiple family members, updated pt current condition, treatment plan and poor prognosis, and answered all the questions. They expressed understanding and appreciation, and will discuss about next step of decision.    Natalie Hawking, MD PhD Stroke Neurology 06/29/19 8:18 AM   To contact Stroke Continuity provider, please refer to [email or web address] After hours, contact General Neurology

## 2019-07-12 NOTE — Progress Notes (Signed)
Referring Physician(s): Code Stroke- Vonzella Nipple  Supervising Physician: Luanne Bras  Patient Status:  [name] - In-pt  Chief Complaint: None- intubated with sedation  Subjective:  History of acute CVA s/p emergent cerebral arteriogram with mechanical thrombectomy of right MCA superior division M2/M3 segment occlusion and right MCA inferior division M2/M3 segment occlusion achieving a TICI 2C revascularization 05/26/2019 by Dr. Estanislado Pandy. Patient laying in bed intubated with sedation. She does not respond to voice/painful stimuli and does not follow simple commands. Can spontaneously move right side but no movements of left side. Right groin incision c/d/i.   Allergies: Claritin [loratadine], Crestor [rosuvastatin calcium], Lipitor [atorvastatin], Oxycontin [oxycodone hcl], Gabapentin, and Penicillins  Medications: Prior to Admission medications   Medication Sig Start Date End Date Taking? Authorizing Provider  acetaminophen (TYLENOL) 500 MG tablet Take 1,500 mg by mouth 3 (three) times daily as needed for moderate pain.   Yes [provider]  amLODipine (NORVASC) 5 MG tablet Take 1 tablet (5 mg total) by mouth daily. Please make overdue appt with Dr. Meda Coffee before anymore refills. 3rd attempt 04/18/19  Yes Dorothy Spark, MD  aspirin EC 81 MG tablet Take 81 mg by mouth daily.   Yes [provider]  clopidogrel (PLAVIX) 75 MG tablet Take 1 tablet (75 mg total) by mouth daily. Please make overdue appt with Dr. Meda Coffee before anymore refills. 3rd Attempt 04/18/19  Yes Dorothy Spark, MD  metoprolol tartrate (LOPRESSOR) 50 MG tablet Take 1 tablet (50 mg total) by mouth 2 (two) times daily. Please keep upcoming appt in February with Dr. Meda Coffee before anymore refills. Thank you 05/23/19  Yes Dorothy Spark, MD  amitriptyline (ELAVIL) 10 MG tablet Take 1 tablet (10 mg total) by mouth at bedtime. Patient not taking: Reported on 06/10/2019 12/08/17    Tonette Bihari, MD  capsicum oleoresin (TRIXAICIN) 0.025 % cream Apply 1 application topically 3 (three) times daily. Patient not taking: Reported on 06/10/2019 07/20/17   Lucila Maine C, DO  diclofenac sodium (VOLTAREN) 1 % GEL APPLY 2 GRAMS TO AFFECTED AREA 4 TIMES A DAY Patient not taking: Reported on 06/10/2019 06/05/18   Kinnie Feil, MD  lidocaine (XYLOCAINE) 5 % ointment Apply 1 application topically as needed. Patient not taking: Reported on 06/10/2019 03/25/19   Benay Pike, MD  nitroGLYCERIN (NITROSTAT) 0.4 MG SL tablet Place 1 tablet (0.4 mg total) under the tongue every 5 (five) minutes as needed for chest pain. Patient not taking: Reported on 06/10/2019 10/02/15   Dorothy Spark, MD  umeclidinium-vilanterol [facility] ELLIPTA) 62.5-25 MCG/INH AEPB Inhale 1 puff into the lungs daily. Patient not taking: Reported on 06/10/2019 09/30/16   Zenia Resides, MD     Vital Signs: BP (!) 159/86    Pulse 76    Temp 97.7 F (36.5 C) (Axillary)    Resp 16    Ht 5\' 5"  (1.651 m)    Wt 127 lb 13.9 oz (58 kg)    SpO2 100%    BMI 21.28 kg/m   Physical Exam Constitutional:      General: She is not in acute distress.    Comments: Intubated with sedation.  Pulmonary:     Effort: Pulmonary effort is normal. No respiratory distress.     Comments: Intubated with sedation. Skin:    General: Skin is warm and dry.     Comments: Right groin incision soft without active bleeding or hematoma.  Neurological:     Comments: Intubated  with sedation. She does not respond to voice/painful stimuli and does not follow simple commands. PERRL bilaterally. Can spontaneously move right side but no movements of left side. Distal pulses palpable bilaterally with Doppler.      Imaging: Ct Code Stroke Cta Head W/wo Contrast  Result Date: 05/24/2019 CLINICAL DATA:  Left-sided weakness EXAM: CT ANGIOGRAPHY HEAD AND NECK TECHNIQUE: Multidetector CT imaging of the head and neck was performed  using the standard protocol during bolus administration of intravenous contrast. Multiplanar CT image reconstructions and MIPs were obtained to evaluate the vascular anatomy. Carotid stenosis measurements (when applicable) are obtained utilizing NASCET criteria, using the distal internal carotid diameter as the denominator. CONTRAST:  124mL OMNIPAQUE IOHEXOL 350 MG/ML SOLN COMPARISON:  Noncontrast head CT earlier today FINDINGS: CTA NECK FINDINGS Aortic arch: Atherosclerotic plaque with 3 vessel branching. Plaque along the distal arch is irregular. Right carotid system: Atheromatous wall thickening of the common carotid. Moderate calcified plaque at the ICA bulb without 50% or greater stenosis. No ulceration. Left carotid system: Calcified plaque at the common carotid origin without flow limiting stenosis. Moderate mixed density plaque at the proximal ICA with 50% ICA stenosis. Negative for dissection. Vertebral arteries: Subclavian atherosclerosis on both sides without flow limiting stenosis. The vertebral arteries are symmetric and widely patent to the dura. Skeleton: Diffuse disc and facet degeneration. No acute or aggressive finding Other neck: No acute finding Upper chest: No acute finding.  Emphysema. Review of the MIP images confirms the above findings CTA HEAD FINDINGS Anterior circulation: Cut off of 2 right M2 branches at the level of the M2 3 junction. There is poor collateral flow with oligemic appearance on multiple slices over the posterior right frontal and parietal lobes. No left-sided branch occlusion. There is plaque at both carotid siphons without flow limiting stenosis. Apparent outpouching from the left paraclinoid ICA segment is likely calcified plaque based on reformats. No convincing aneurysm. Posterior circulation: Atherosclerotic plaque on the right V4 segment. Diffusely patent vertebral and basilar arteries. No branch occlusion, beading, or aneurysm. Venous sinuses: Unremarkable in the  arterial phase Anatomic variants: None significant These results were called by telephone at the time of interpretation on 05/30/2019 at 4:36 pm to Dr Erlinda Hong , who was already aware of the findings. Review of the MIP images confirms the above findings IMPRESSION: 1. Occlusion of 2 right MCA branches at the M2/3 junction. No flow limiting stenosis or embolic source in the more proximal right carotid circulation. Paucity of filling vessels along the affected right cerebral convexity-subjectively poor collateral flow. 2. 50% atheromatous narrowing at the proximal left ICA. Electronically Signed   By: Monte Fantasia M.D.   On: 05/21/2019 16:47   Ct Code Stroke Cta Neck W/wo Contrast  Result Date: 05/16/2019 CLINICAL DATA:  Left-sided weakness EXAM: CT ANGIOGRAPHY HEAD AND NECK TECHNIQUE: Multidetector CT imaging of the head and neck was performed using the standard protocol during bolus administration of intravenous contrast. Multiplanar CT image reconstructions and MIPs were obtained to evaluate the vascular anatomy. Carotid stenosis measurements (when applicable) are obtained utilizing NASCET criteria, using the distal internal carotid diameter as the denominator. CONTRAST:  145mL OMNIPAQUE IOHEXOL 350 MG/ML SOLN COMPARISON:  Noncontrast head CT earlier today FINDINGS: CTA NECK FINDINGS Aortic arch: Atherosclerotic plaque with 3 vessel branching. Plaque along the distal arch is irregular. Right carotid system: Atheromatous wall thickening of the common carotid. Moderate calcified plaque at the ICA bulb without 50% or greater stenosis. No ulceration. Left carotid system: Calcified plaque  at the common carotid origin without flow limiting stenosis. Moderate mixed density plaque at the proximal ICA with 50% ICA stenosis. Negative for dissection. Vertebral arteries: Subclavian atherosclerosis on both sides without flow limiting stenosis. The vertebral arteries are symmetric and widely patent to the dura. Skeleton:  Diffuse disc and facet degeneration. No acute or aggressive finding Other neck: No acute finding Upper chest: No acute finding.  Emphysema. Review of the MIP images confirms the above findings CTA HEAD FINDINGS Anterior circulation: Cut off of 2 right M2 branches at the level of the M2 3 junction. There is poor collateral flow with oligemic appearance on multiple slices over the posterior right frontal and parietal lobes. No left-sided branch occlusion. There is plaque at both carotid siphons without flow limiting stenosis. Apparent outpouching from the left paraclinoid ICA segment is likely calcified plaque based on reformats. No convincing aneurysm. Posterior circulation: Atherosclerotic plaque on the right V4 segment. Diffusely patent vertebral and basilar arteries. No branch occlusion, beading, or aneurysm. Venous sinuses: Unremarkable in the arterial phase Anatomic variants: None significant These results were called by telephone at the time of interpretation on 06/01/2019 at 4:36 pm to Dr Erlinda Hong , who was already aware of the findings. Review of the MIP images confirms the above findings IMPRESSION: 1. Occlusion of 2 right MCA branches at the M2/3 junction. No flow limiting stenosis or embolic source in the more proximal right carotid circulation. Paucity of filling vessels along the affected right cerebral convexity-subjectively poor collateral flow. 2. 50% atheromatous narrowing at the proximal left ICA. Electronically Signed   By: Monte Fantasia M.D.   On: 05/14/2019 16:47   Mr Jodene Nam Head Wo Contrast  Result Date: 06/10/2019 CLINICAL DATA:  Follow-up stroke intervention. Left-sided weakness. Embolic occlusion of right MCA branches. EXAM: MRI HEAD WITHOUT CONTRAST MRA HEAD WITHOUT CONTRAST TECHNIQUE: Multiplanar, multiecho pulse sequences of the brain and surrounding structures were obtained without intravenous contrast. Angiographic images of the head were obtained using MRA technique without contrast.  COMPARISON:  CT studies done yesterday.  Intervention today. FINDINGS: MRI HEAD FINDINGS Brain: No acute finding affecting the brainstem or cerebellum. Within the right cerebral hemisphere there is acute infarction affecting the insula and frontoparietal brain. The overall region measures approximately 5-6 cm in size. No acute deep brain infarction or other vascular territory infarction on the right. Within the left cerebral hemisphere, there is acute infarction affecting the complete anterior and middle cerebral artery territories. The only non infarcted brain in the left hemisphere is that served by the left PCA, including the posteromedial temporal lobe, occipital lobe and thalamus. There is brain swelling, particularly of the left hemisphere, with left-to-right shift of 1 cm. There are petechial blood products throughout the left basal ganglia, left frontal cortical brain and left temporal tip cortical brain. No confluent hematoma. Vascular: See results of MR angiography Skull and upper cervical spine: Negative Sinuses/Orbits: Clear/normal Other: None MRA HEAD FINDINGS Right internal carotid artery is patent through the skull base and siphon region. Mild stenosis within the siphon, estimated at 30%. Supraclinoid ICA is widely patent. Flow is present throughout the right middle cerebral artery territory. Flow is present within the right anterior cerebral artery, with presumed flow related loss of signal in the more distal anterior cerebral artery, given that there does not appear to be infarction in the anterior cerebral territory. Left internal carotid artery shows present but diminished flow through the skull base and siphon regions. We do not see flow in the left  anterior cerebral artery. There is some flow within a small left M2 branch, with the larger M2 branch being occluded. Both vertebral arteries are patent through the foramen magnum to the basilar. No basilar stenosis. Posterior circulation branch  vessels show flow. IMPRESSION: 5-6 cm region of acute infarction in the right cerebral hemisphere affecting the insula and frontoparietal brain. No hemorrhage in that region. Acute infarction of the majority of the left hemisphere throughout the anterior and middle cerebral artery territories, with sparing of the PCA territory. Swelling with left-to-right shift of 1 cm. Petechial blood products present particularly in the left frontal operculum and temporal tip region. Occlusion of the main left M2 branch. Occlusion of the left anterior cerebral artery. Diminished flow in the smaller left M2 branch. Electronically Signed   By: Nelson Chimes M.D.   On: 06/10/2019 15:57   Mr Brain Wo Contrast  Result Date: 06/10/2019 CLINICAL DATA:  Follow-up stroke intervention. Left-sided weakness. Embolic occlusion of right MCA branches. EXAM: MRI HEAD WITHOUT CONTRAST MRA HEAD WITHOUT CONTRAST TECHNIQUE: Multiplanar, multiecho pulse sequences of the brain and surrounding structures were obtained without intravenous contrast. Angiographic images of the head were obtained using MRA technique without contrast. COMPARISON:  CT studies done yesterday.  Intervention today. FINDINGS: MRI HEAD FINDINGS Brain: No acute finding affecting the brainstem or cerebellum. Within the right cerebral hemisphere there is acute infarction affecting the insula and frontoparietal brain. The overall region measures approximately 5-6 cm in size. No acute deep brain infarction or other vascular territory infarction on the right. Within the left cerebral hemisphere, there is acute infarction affecting the complete anterior and middle cerebral artery territories. The only non infarcted brain in the left hemisphere is that served by the left PCA, including the posteromedial temporal lobe, occipital lobe and thalamus. There is brain swelling, particularly of the left hemisphere, with left-to-right shift of 1 cm. There are petechial blood products  throughout the left basal ganglia, left frontal cortical brain and left temporal tip cortical brain. No confluent hematoma. Vascular: See results of MR angiography Skull and upper cervical spine: Negative Sinuses/Orbits: Clear/normal Other: None MRA HEAD FINDINGS Right internal carotid artery is patent through the skull base and siphon region. Mild stenosis within the siphon, estimated at 30%. Supraclinoid ICA is widely patent. Flow is present throughout the right middle cerebral artery territory. Flow is present within the right anterior cerebral artery, with presumed flow related loss of signal in the more distal anterior cerebral artery, given that there does not appear to be infarction in the anterior cerebral territory. Left internal carotid artery shows present but diminished flow through the skull base and siphon regions. We do not see flow in the left anterior cerebral artery. There is some flow within a small left M2 branch, with the larger M2 branch being occluded. Both vertebral arteries are patent through the foramen magnum to the basilar. No basilar stenosis. Posterior circulation branch vessels show flow. IMPRESSION: 5-6 cm region of acute infarction in the right cerebral hemisphere affecting the insula and frontoparietal brain. No hemorrhage in that region. Acute infarction of the majority of the left hemisphere throughout the anterior and middle cerebral artery territories, with sparing of the PCA territory. Swelling with left-to-right shift of 1 cm. Petechial blood products present particularly in the left frontal operculum and temporal tip region. Occlusion of the main left M2 branch. Occlusion of the left anterior cerebral artery. Diminished flow in the smaller left M2 branch. Electronically Signed   By: Elta Guadeloupe  Shogry M.D.   On: 06/10/2019 15:57   Ct Code Stroke Cerebral Perfusion With Contrast  Result Date: 06/10/2019 CLINICAL DATA:  Left-sided weakness EXAM: CT PERFUSION BRAIN TECHNIQUE:  Multiphase CT imaging of the brain was performed following IV bolus contrast injection. Subsequent parametric perfusion maps were calculated using RAPID software. CONTRAST:  163mL OMNIPAQUE IOHEXOL 350 MG/ML SOLN COMPARISON:  Noncontrast head CT earlier today FINDINGS: Attempted but unsuccessful CT perfusion due to patient motion. No additional findings are seen. IMPRESSION: Attempted but failed and nondiagnostic CT perfusion due to patient motion. Electronically Signed   By: Monte Fantasia M.D.   On: 06/07/2019 16:36   Dg Chest Port 1 View  Result Date: 06/10/2019 CLINICAL DATA:  Intubation.  Stroke. EXAM: PORTABLE CHEST 1 VIEW COMPARISON:  09/28/2016, 05/26/2009. FINDINGS: Endotracheal tube tip noted 6 cm above the carina. Heart size normal. Left base subsegmental atelectasis and or scarring again noted. Slight increase interstitial markings noted bilaterally. Interstitial edema and/or pneumonitis cannot be excluded. Slight elevation left hemidiaphragm. Small left pleural effusion. Right costophrenic angle not completely imaged. No pneumothorax. IMPRESSION: 1.  Endotracheal tube tip noted 6 cm above the carina. 2. Left base subsegmental atelectasis and or scarring again noted. Slight increase interstitial markings noted bilaterally. Interstitial edema and/or pneumonitis cannot be excluded. 3. Slight elevation left hemidiaphragm. Small left pleural effusion. Electronically Signed   By: Marcello Moores  Register   On: 06/10/2019 07:20   Ct Head Code Stroke Wo Contrast  Result Date: 06/03/2019 CLINICAL DATA:  Code stroke.  Left-sided paralysis and facial droop EXAM: CT HEAD WITHOUT CONTRAST TECHNIQUE: Contiguous axial images were obtained from the base of the skull through the vertex without intravenous contrast. COMPARISON:  None. FINDINGS: Brain: No evidence of acute infarction, hemorrhage, hydrocephalus, extra-axial collection or mass lesion/mass effect. Vascular: No hyperdense vessel or unexpected  calcification. Skull: Normal. Negative for fracture or focal lesion. Sinuses/Orbits: No acute finding. Other: These results were communicated to Xu at 3:55 pmon 11/15/2020by text page via the [facility] messaging system. ASPECTS [facility] Stroke Program Early CT Score) - Ganglionic level infarction (caudate, lentiform nuclei, internal capsule, insula, M1-M3 cortex): 7 - Supraganglionic infarction (M4-M6 cortex): 3 Total score (0-10 with 10 being normal): 10 IMPRESSION: No acute finding. ASPECTS is 10. Electronically Signed   By: Monte Fantasia M.D.   On: 05/23/2019 15:57    Labs:  CBC: Recent Labs    05/12/2019 1542 05/27/2019 1548 05/14/2019 2000 06/10/19 0340 06/10/19 0500  WBC 10.3  --   --   --  11.7*  HGB 11.6* 12.6 11.6* 11.2* 10.2*  HCT 36.8 37.0 34.0* 33.0* 31.2*  PLT 531*  --   --   --  421*    COAGS: Recent Labs    05/30/2019 1542  INR 1.0  APTT 23*    BMP: Recent Labs    06/06/2019 1542 06/01/2019 1548 06/09/19 2000 06/10/19 0340 06/10/19 0500  NA 143 142 140 138 138  K 4.1 4.1 4.0 3.8 3.7  CL 104 104  --   --  106  CO2 25  --   --   --  23  GLUCOSE 111* 109*  --   --  160*  BUN 15 17  --   --  14  CALCIUM 9.7  --   --   --  8.3*  CREATININE 0.96 0.90  --   --  0.72  GFRNONAA 55*  --   --   --  >60  GFRAA >60  --   --   --  >  60    LIVER FUNCTION TESTS: Recent Labs    06/03/2019 1542  BILITOT 0.5  AST 18  ALT 11  ALKPHOS 39  PROT 7.5  ALBUMIN 3.9    Assessment and Plan:  History of acute CVA s/p emergent cerebral arteriogram with mechanical thrombectomy of right MCA superior division M2/M3 segment occlusion and right MCA inferior division M2/M3 segment occlusion achieving a TICI 2C revascularization 06/06/2019 by Dr. Estanislado Pandy. Patient's condition stable- remains intubated/sedated, moves right side but no spontaneous movements of left side. Right groin incision stable. Further plans per neurology- appreciate and agree with management. Please call [facility] with  questions/concerns.   Electronically Signed: Earley Abide, PA-C 07/10/2019, 11:03 AM   I spent a total of 25 Minutes at the the patient's bedside AND on the patient's [name] floor or unit, greater than 50% of which was counseling/coordinating care for right MCA superior division M2/M3 segment occlusion and right MCA inferior division M2/M3 segment occlusion s/p embolization.

## 2019-07-12 DEATH — deceased

## 2019-08-28 ENCOUNTER — Ambulatory Visit: Payer: Medicare Other | Admitting: Cardiology
